# Patient Record
Sex: Male | Born: 1980 | Hispanic: Yes
Health system: Southern US, Academic
[De-identification: ages and names within clinical notes are randomized; demographics above are authoritative.]

## PROBLEM LIST (undated history)

## (undated) ENCOUNTER — Ambulatory Visit: Payer: MEDICARE

## (undated) ENCOUNTER — Encounter: Attending: Nephrology | Primary: Nephrology

## (undated) ENCOUNTER — Ambulatory Visit
Payer: MEDICARE | Attending: Student in an Organized Health Care Education/Training Program | Primary: Student in an Organized Health Care Education/Training Program

## (undated) ENCOUNTER — Encounter

## (undated) ENCOUNTER — Ambulatory Visit: Payer: MEDICARE | Attending: Registered" | Primary: Registered"

## (undated) ENCOUNTER — Telehealth

## (undated) ENCOUNTER — Ambulatory Visit: Payer: MEDICARE | Attending: Nephrology | Primary: Nephrology

## (undated) ENCOUNTER — Ambulatory Visit

## (undated) ENCOUNTER — Ambulatory Visit: Payer: MEDICARE | Attending: Addiction (Substance Use Disorder) | Primary: Addiction (Substance Use Disorder)

## (undated) ENCOUNTER — Encounter
Attending: Student in an Organized Health Care Education/Training Program | Primary: Student in an Organized Health Care Education/Training Program

## (undated) ENCOUNTER — Ambulatory Visit
Payer: MEDICARE | Attending: Rehabilitative and Restorative Service Providers" | Primary: Rehabilitative and Restorative Service Providers"

## (undated) ENCOUNTER — Ambulatory Visit: Payer: MEDICARE | Attending: Clinical | Primary: Clinical

## (undated) ENCOUNTER — Encounter: Attending: Physician Assistant | Primary: Physician Assistant

## (undated) ENCOUNTER — Encounter: Attending: Family | Primary: Family

## (undated) ENCOUNTER — Ambulatory Visit: Payer: MEDICARE | Attending: Psychologist | Primary: Psychologist

## (undated) ENCOUNTER — Encounter: Attending: Nurse Practitioner | Primary: Nurse Practitioner

## (undated) ENCOUNTER — Encounter: Payer: MEDICARE | Attending: Addiction (Substance Use Disorder) | Primary: Addiction (Substance Use Disorder)

## (undated) ENCOUNTER — Ambulatory Visit
Attending: Student in an Organized Health Care Education/Training Program | Primary: Student in an Organized Health Care Education/Training Program

## (undated) ENCOUNTER — Encounter: Attending: Registered" | Primary: Registered"

## (undated) ENCOUNTER — Ambulatory Visit: Payer: MEDICARE | Attending: Infectious Disease | Primary: Infectious Disease

## (undated) ENCOUNTER — Ambulatory Visit: Payer: MEDICARE | Attending: Surgery | Primary: Surgery

## (undated) ENCOUNTER — Encounter: Attending: Infectious Disease | Primary: Infectious Disease

## (undated) ENCOUNTER — Encounter: Attending: Anesthesiology | Primary: Anesthesiology

## (undated) ENCOUNTER — Ambulatory Visit: Attending: Nephrology | Primary: Nephrology

## (undated) ENCOUNTER — Telehealth: Attending: Infectious Disease | Primary: Infectious Disease

## (undated) ENCOUNTER — Ambulatory Visit: Payer: MEDICARE | Attending: Family | Primary: Family

## (undated) ENCOUNTER — Telehealth
Attending: Student in an Organized Health Care Education/Training Program | Primary: Student in an Organized Health Care Education/Training Program

## (undated) ENCOUNTER — Inpatient Hospital Stay

## (undated) ENCOUNTER — Ambulatory Visit: Payer: MEDICARE | Attending: Physician Assistant | Primary: Physician Assistant

## (undated) ENCOUNTER — Telehealth: Attending: Family | Primary: Family

## (undated) ENCOUNTER — Encounter: Attending: Blood Banking & Transfusion Medicine | Primary: Blood Banking & Transfusion Medicine

## (undated) ENCOUNTER — Encounter: Attending: Clinical | Primary: Clinical

## (undated) ENCOUNTER — Encounter: Attending: Vascular & Interventional Radiology | Primary: Vascular & Interventional Radiology

## (undated) ENCOUNTER — Telehealth: Attending: Registered" | Primary: Registered"

## (undated) ENCOUNTER — Institutional Professional Consult (permissible substitution): Payer: MEDICARE | Attending: Registered" | Primary: Registered"

## (undated) DIAGNOSIS — I10 Essential (primary) hypertension: Secondary | ICD-10-CM

## (undated) DIAGNOSIS — F419 Anxiety disorder, unspecified: Secondary | ICD-10-CM

## (undated) DIAGNOSIS — E119 Type 2 diabetes mellitus without complications: Secondary | ICD-10-CM

## (undated) DIAGNOSIS — N289 Disorder of kidney and ureter, unspecified: Secondary | ICD-10-CM

## (undated) DIAGNOSIS — G4730 Sleep apnea, unspecified: Secondary | ICD-10-CM

## (undated) DIAGNOSIS — E785 Hyperlipidemia, unspecified: Secondary | ICD-10-CM

## (undated) HISTORY — PX: LAPAROSCOPIC GASTRIC SLEEVE RESECTION: SHX5895

## (undated) HISTORY — DX: Hyperlipidemia, unspecified: E78.5

## (undated) HISTORY — DX: Sleep apnea, unspecified: G47.30

## (undated) HISTORY — PX: KIDNEY TRANSPLANT: SHX239

## (undated) HISTORY — DX: Anxiety disorder, unspecified: F41.9

## (undated) MED ORDER — GABAPENTIN 100 MG CAPSULE: Freq: Three times a day (TID) | ORAL | 0 days | Status: SS

---
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
## 2005-02-25 ENCOUNTER — Other Ambulatory Visit: Payer: Self-pay

## 2005-02-25 ENCOUNTER — Emergency Department: Payer: Self-pay | Admitting: Emergency Medicine

## 2005-03-12 ENCOUNTER — Ambulatory Visit: Payer: Self-pay | Admitting: Unknown Physician Specialty

## 2005-03-30 ENCOUNTER — Ambulatory Visit: Payer: Self-pay | Admitting: Unknown Physician Specialty

## 2005-04-01 ENCOUNTER — Emergency Department: Payer: Self-pay | Admitting: General Practice

## 2005-04-29 ENCOUNTER — Ambulatory Visit: Payer: Self-pay | Admitting: Unknown Physician Specialty

## 2005-10-07 ENCOUNTER — Other Ambulatory Visit: Payer: Self-pay

## 2005-10-07 ENCOUNTER — Emergency Department: Payer: Self-pay | Admitting: Emergency Medicine

## 2005-10-24 ENCOUNTER — Ambulatory Visit: Payer: Self-pay | Admitting: Unknown Physician Specialty

## 2005-10-30 ENCOUNTER — Ambulatory Visit: Payer: Self-pay | Admitting: Unknown Physician Specialty

## 2005-11-29 ENCOUNTER — Ambulatory Visit: Payer: Self-pay | Admitting: Unknown Physician Specialty

## 2005-12-30 ENCOUNTER — Ambulatory Visit: Payer: Self-pay | Admitting: Unknown Physician Specialty

## 2006-03-21 ENCOUNTER — Ambulatory Visit: Payer: Self-pay | Admitting: Nephrology

## 2006-08-25 ENCOUNTER — Ambulatory Visit: Payer: Self-pay | Admitting: Nephrology

## 2006-08-29 ENCOUNTER — Ambulatory Visit: Payer: Self-pay | Admitting: Nephrology

## 2006-09-04 ENCOUNTER — Ambulatory Visit: Payer: Self-pay | Admitting: Nephrology

## 2006-11-13 ENCOUNTER — Ambulatory Visit: Payer: Self-pay | Admitting: Nephrology

## 2007-06-09 ENCOUNTER — Ambulatory Visit: Payer: Self-pay

## 2013-04-19 ENCOUNTER — Inpatient Hospital Stay: Payer: Self-pay | Admitting: Internal Medicine

## 2013-04-19 LAB — COMPREHENSIVE METABOLIC PANEL
Albumin: 3.5 g/dL (ref 3.4–5.0)
Calcium, Total: 9.6 mg/dL (ref 8.5–10.1)
Chloride: 109 mmol/L — ABNORMAL HIGH (ref 98–107)
Co2: 21 mmol/L (ref 21–32)
Creatinine: 2.15 mg/dL — ABNORMAL HIGH (ref 0.60–1.30)
EGFR (Non-African Amer.): 40 — ABNORMAL LOW
Potassium: 4 mmol/L (ref 3.5–5.1)
SGOT(AST): 19 U/L (ref 15–37)
SGPT (ALT): 15 U/L (ref 12–78)
Total Protein: 6.7 g/dL (ref 6.4–8.2)

## 2013-04-19 LAB — URINALYSIS, COMPLETE
Bacteria: NONE SEEN
Bilirubin,UR: NEGATIVE
Glucose,UR: 150 mg/dL (ref 0–75)
Protein: 30
RBC,UR: 1 /HPF (ref 0–5)
Specific Gravity: 1.014 (ref 1.003–1.030)
Squamous Epithelial: <1
WBC UR: 1 /HPF (ref 0–5)

## 2013-04-19 LAB — CBC
MCV: 82 fL (ref 80–100)
Platelet: 203 10*3/uL (ref 150–440)
RBC: 4.01 10*6/uL — ABNORMAL LOW (ref 4.40–5.90)
RDW: 15 % — ABNORMAL HIGH (ref 11.5–14.5)
WBC: 13.7 10*3/uL — ABNORMAL HIGH (ref 3.8–10.6)

## 2013-04-20 LAB — BASIC METABOLIC PANEL
Anion Gap: 6 — ABNORMAL LOW (ref 7–16)
BUN: 21 mg/dL — ABNORMAL HIGH (ref 7–18)
Co2: 23 mmol/L (ref 21–32)
Creatinine: 2.15 mg/dL — ABNORMAL HIGH (ref 0.60–1.30)
EGFR (African American): 46 — ABNORMAL LOW
Glucose: 167 mg/dL — ABNORMAL HIGH (ref 65–99)
Osmolality: 282 (ref 275–301)
Potassium: 4.3 mmol/L (ref 3.5–5.1)
Sodium: 138 mmol/L (ref 136–145)

## 2013-04-20 LAB — CBC WITH DIFFERENTIAL/PLATELET
Basophil %: 0.5 %
Eosinophil #: 0.1 10*3/uL (ref 0.0–0.7)
Eosinophil %: 1.3 %
HGB: 10.2 g/dL — ABNORMAL LOW (ref 13.0–18.0)
MCH: 26.3 pg (ref 26.0–34.0)
MCHC: 32 g/dL (ref 32.0–36.0)
MCV: 82 fL (ref 80–100)
Monocyte #: 0.7 x10 3/mm (ref 0.2–1.0)
Monocyte %: 6.4 %
Neutrophil #: 9.3 10*3/uL — ABNORMAL HIGH (ref 1.4–6.5)
RBC: 3.86 10*6/uL — ABNORMAL LOW (ref 4.40–5.90)

## 2013-04-21 LAB — BASIC METABOLIC PANEL
Anion Gap: 3 — ABNORMAL LOW (ref 7–16)
BUN: 24 mg/dL — ABNORMAL HIGH (ref 7–18)
Calcium, Total: 9.4 mg/dL (ref 8.5–10.1)
Chloride: 108 mmol/L — ABNORMAL HIGH (ref 98–107)
Co2: 27 mmol/L (ref 21–32)
Creatinine: 2.18 mg/dL — ABNORMAL HIGH (ref 0.60–1.30)
EGFR (African American): 45 — ABNORMAL LOW
EGFR (Non-African Amer.): 39 — ABNORMAL LOW
Glucose: 228 mg/dL — ABNORMAL HIGH (ref 65–99)
Sodium: 138 mmol/L (ref 136–145)

## 2013-04-21 LAB — CBC WITH DIFFERENTIAL/PLATELET
Basophil #: 0.1 10*3/uL (ref 0.0–0.1)
Eosinophil #: 0.1 10*3/uL (ref 0.0–0.7)
Eosinophil %: 1.3 %
HGB: 10.6 g/dL — ABNORMAL LOW (ref 13.0–18.0)
MCH: 26.6 pg (ref 26.0–34.0)
MCHC: 32.1 g/dL (ref 32.0–36.0)
MCV: 83 fL (ref 80–100)
Monocyte #: 0.5 x10 3/mm (ref 0.2–1.0)
Monocyte %: 5.2 %
Neutrophil #: 8 10*3/uL — ABNORMAL HIGH (ref 1.4–6.5)
Neutrophil %: 84.9 %
RDW: 14.5 % (ref 11.5–14.5)
WBC: 9.5 10*3/uL (ref 3.8–10.6)

## 2013-04-25 LAB — CULTURE, BLOOD (SINGLE)

## 2014-03-20 ENCOUNTER — Inpatient Hospital Stay: Payer: Self-pay | Admitting: Internal Medicine

## 2014-03-20 LAB — URINALYSIS, COMPLETE
Bacteria: NONE SEEN
Bilirubin,UR: NEGATIVE
KETONE: NEGATIVE
LEUKOCYTE ESTERASE: NEGATIVE
Nitrite: NEGATIVE
Ph: 5 (ref 4.5–8.0)
Protein: 100
Specific Gravity: 1.014 (ref 1.003–1.030)
Squamous Epithelial: <1
WBC UR: 18 /HPF (ref 0–5)

## 2014-03-20 LAB — CBC WITH DIFFERENTIAL/PLATELET
BASOS PCT: 0.4 %
Basophil #: 0.1 10*3/uL (ref 0.0–0.1)
EOS ABS: 0.1 10*3/uL (ref 0.0–0.7)
Eosinophil %: 1.2 %
HCT: 36.8 % — ABNORMAL LOW (ref 40.0–52.0)
HGB: 12 g/dL — ABNORMAL LOW (ref 13.0–18.0)
LYMPHS ABS: 0.5 10*3/uL — AB (ref 1.0–3.6)
LYMPHS PCT: 4.2 %
MCH: 28.2 pg (ref 26.0–34.0)
MCHC: 32.5 g/dL (ref 32.0–36.0)
MCV: 87 fL (ref 80–100)
MONOS PCT: 8.6 %
Monocyte #: 1.1 x10 3/mm — ABNORMAL HIGH (ref 0.2–1.0)
Neutrophil #: 10.6 10*3/uL — ABNORMAL HIGH (ref 1.4–6.5)
Neutrophil %: 85.6 %
PLATELETS: 196 10*3/uL (ref 150–440)
RBC: 4.24 10*6/uL — ABNORMAL LOW (ref 4.40–5.90)
RDW: 14.5 % (ref 11.5–14.5)
WBC: 12.3 10*3/uL — ABNORMAL HIGH (ref 3.8–10.6)

## 2014-03-20 LAB — COMPREHENSIVE METABOLIC PANEL
ALK PHOS: 87 U/L
Albumin: 3.8 g/dL (ref 3.4–5.0)
Anion Gap: 9 (ref 7–16)
BUN: 31 mg/dL — ABNORMAL HIGH (ref 7–18)
Bilirubin,Total: 0.6 mg/dL (ref 0.2–1.0)
CALCIUM: 9.7 mg/dL (ref 8.5–10.1)
CREATININE: 2.74 mg/dL — AB (ref 0.60–1.30)
Chloride: 105 mmol/L (ref 98–107)
Co2: 19 mmol/L — ABNORMAL LOW (ref 21–32)
EGFR (African American): 34 — ABNORMAL LOW
EGFR (Non-African Amer.): 29 — ABNORMAL LOW
Glucose: 322 mg/dL — ABNORMAL HIGH (ref 65–99)
OSMOLALITY: 285 (ref 275–301)
Potassium: 4.2 mmol/L (ref 3.5–5.1)
SGOT(AST): <5 U/L — ABNORMAL LOW (ref 15–37)
SGPT (ALT): 17 U/L (ref 12–78)
Sodium: 133 mmol/L — ABNORMAL LOW (ref 136–145)
Total Protein: 7.1 g/dL (ref 6.4–8.2)

## 2014-03-20 LAB — LIPASE, BLOOD: LIPASE: 232 U/L (ref 73–393)

## 2014-03-21 LAB — COMPREHENSIVE METABOLIC PANEL
Albumin: 3.1 g/dL — ABNORMAL LOW (ref 3.4–5.0)
Alkaline Phosphatase: 66 U/L
Anion Gap: 6 — ABNORMAL LOW (ref 7–16)
BUN: 22 mg/dL — AB (ref 7–18)
Bilirubin,Total: 0.6 mg/dL (ref 0.2–1.0)
CALCIUM: 8.8 mg/dL (ref 8.5–10.1)
CO2: 22 mmol/L (ref 21–32)
Chloride: 112 mmol/L — ABNORMAL HIGH (ref 98–107)
Creatinine: 2.31 mg/dL — ABNORMAL HIGH (ref 0.60–1.30)
EGFR (African American): 42 — ABNORMAL LOW
GFR CALC NON AF AMER: 36 — AB
Glucose: 147 mg/dL — ABNORMAL HIGH (ref 65–99)
Osmolality: 285 (ref 275–301)
Potassium: 4.3 mmol/L (ref 3.5–5.1)
SGOT(AST): 13 U/L — ABNORMAL LOW (ref 15–37)
SGPT (ALT): 17 U/L (ref 12–78)
Sodium: 140 mmol/L (ref 136–145)
TOTAL PROTEIN: 5.8 g/dL — AB (ref 6.4–8.2)

## 2014-03-21 LAB — CBC WITH DIFFERENTIAL/PLATELET
BASOS ABS: 0 10*3/uL (ref 0.0–0.1)
Basophil %: 0.2 %
EOS ABS: 0.2 10*3/uL (ref 0.0–0.7)
Eosinophil %: 2.8 %
HCT: 33.1 % — ABNORMAL LOW (ref 40.0–52.0)
HGB: 10.8 g/dL — ABNORMAL LOW (ref 13.0–18.0)
LYMPHS ABS: 0.7 10*3/uL — AB (ref 1.0–3.6)
LYMPHS PCT: 9.7 %
MCH: 28.5 pg (ref 26.0–34.0)
MCHC: 32.7 g/dL (ref 32.0–36.0)
MCV: 87 fL (ref 80–100)
MONO ABS: 0.8 x10 3/mm (ref 0.2–1.0)
Monocyte %: 11.5 %
Neutrophil #: 5.6 10*3/uL (ref 1.4–6.5)
Neutrophil %: 75.8 %
Platelet: 173 10*3/uL (ref 150–440)
RBC: 3.8 10*6/uL — AB (ref 4.40–5.90)
RDW: 14.2 % (ref 11.5–14.5)
WBC: 7.4 10*3/uL (ref 3.8–10.6)

## 2014-04-06 ENCOUNTER — Inpatient Hospital Stay: Payer: Self-pay | Admitting: Internal Medicine

## 2014-04-06 LAB — COMPREHENSIVE METABOLIC PANEL
ALBUMIN: 3.8 g/dL (ref 3.4–5.0)
ALK PHOS: 79 U/L
ANION GAP: 5 — AB (ref 7–16)
AST: 20 U/L (ref 15–37)
BILIRUBIN TOTAL: 0.3 mg/dL (ref 0.2–1.0)
BUN: 33 mg/dL — ABNORMAL HIGH (ref 7–18)
CO2: 20 mmol/L — AB (ref 21–32)
Calcium, Total: 8.7 mg/dL (ref 8.5–10.1)
Chloride: 109 mmol/L — ABNORMAL HIGH (ref 98–107)
Creatinine: 3.61 mg/dL — ABNORMAL HIGH (ref 0.60–1.30)
EGFR (African American): 24 — ABNORMAL LOW
EGFR (Non-African Amer.): 21 — ABNORMAL LOW
GLUCOSE: 318 mg/dL — AB (ref 65–99)
Osmolality: 288 (ref 275–301)
POTASSIUM: 4.9 mmol/L (ref 3.5–5.1)
SGPT (ALT): 26 U/L (ref 12–78)
Sodium: 134 mmol/L — ABNORMAL LOW (ref 136–145)
Total Protein: 7.3 g/dL (ref 6.4–8.2)

## 2014-04-06 LAB — CBC WITH DIFFERENTIAL/PLATELET
Basophil #: 0 10*3/uL (ref 0.0–0.1)
Basophil %: 0.7 %
Eosinophil #: 0 10*3/uL (ref 0.0–0.7)
Eosinophil %: 0.7 %
HCT: 37.8 % — ABNORMAL LOW (ref 40.0–52.0)
HGB: 12.3 g/dL — ABNORMAL LOW (ref 13.0–18.0)
LYMPHS PCT: 23.5 %
Lymphocyte #: 0.6 10*3/uL — ABNORMAL LOW (ref 1.0–3.6)
MCH: 28.5 pg (ref 26.0–34.0)
MCHC: 32.6 g/dL (ref 32.0–36.0)
MCV: 87 fL (ref 80–100)
MONO ABS: 0.4 x10 3/mm (ref 0.2–1.0)
Monocyte %: 16.4 %
Neutrophil #: 1.6 10*3/uL (ref 1.4–6.5)
Neutrophil %: 58.7 %
PLATELETS: 180 10*3/uL (ref 150–440)
RBC: 4.33 10*6/uL — AB (ref 4.40–5.90)
RDW: 14.6 % — AB (ref 11.5–14.5)
WBC: 2.7 10*3/uL — AB (ref 3.8–10.6)

## 2014-04-06 LAB — URINALYSIS, COMPLETE
BACTERIA: NONE SEEN
Bilirubin,UR: NEGATIVE
Ketone: NEGATIVE
Leukocyte Esterase: NEGATIVE
Nitrite: NEGATIVE
Ph: 5 (ref 4.5–8.0)
Specific Gravity: 1.017 (ref 1.003–1.030)
Squamous Epithelial: <1
WBC UR: <1 /HPF (ref 0–5)

## 2014-04-06 LAB — LIPASE, BLOOD: LIPASE: 389 U/L (ref 73–393)

## 2014-04-06 LAB — TROPONIN I

## 2014-04-07 LAB — COMPREHENSIVE METABOLIC PANEL
ALT: 19 U/L (ref 12–78)
Albumin: 2.8 g/dL — ABNORMAL LOW (ref 3.4–5.0)
Alkaline Phosphatase: 55 U/L
Anion Gap: 8 (ref 7–16)
BILIRUBIN TOTAL: 0.2 mg/dL (ref 0.2–1.0)
BUN: 24 mg/dL — ABNORMAL HIGH (ref 7–18)
CHLORIDE: 116 mmol/L — AB (ref 98–107)
CO2: 14 mmol/L — AB (ref 21–32)
Calcium, Total: 7.9 mg/dL — ABNORMAL LOW (ref 8.5–10.1)
Creatinine: 2.59 mg/dL — ABNORMAL HIGH (ref 0.60–1.30)
EGFR (Non-African Amer.): 31 — ABNORMAL LOW
GFR CALC AF AMER: 36 — AB
Glucose: 232 mg/dL — ABNORMAL HIGH (ref 65–99)
Osmolality: 287 (ref 275–301)
Potassium: 4.6 mmol/L (ref 3.5–5.1)
SGOT(AST): 15 U/L (ref 15–37)
Sodium: 138 mmol/L (ref 136–145)
TOTAL PROTEIN: 5.5 g/dL — AB (ref 6.4–8.2)

## 2014-04-07 LAB — CBC WITH DIFFERENTIAL/PLATELET
BASOS PCT: 0.1 %
Basophil #: 0 10*3/uL (ref 0.0–0.1)
Basophil #: 0 10*3/uL (ref 0.0–0.1)
Basophil %: 0.4 %
EOS PCT: 0.1 %
Eosinophil #: 0 10*3/uL (ref 0.0–0.7)
Eosinophil #: 0 10*3/uL (ref 0.0–0.7)
Eosinophil %: 0.1 %
HCT: 29.5 % — AB (ref 40.0–52.0)
HCT: 32 % — AB (ref 40.0–52.0)
HGB: 9.7 g/dL — AB (ref 13.0–18.0)
HGB: 10.5 g/dL — AB (ref 13.0–18.0)
Lymphocyte #: 0.5 10*3/uL — ABNORMAL LOW (ref 1.0–3.6)
Lymphocyte #: 0.3 10*3/uL — ABNORMAL LOW (ref 1.0–3.6)
Lymphocyte %: 23.2 %
Lymphocyte %: 8.4 %
MCH: 28.3 pg (ref 26.0–34.0)
MCH: 28.2 pg (ref 26.0–34.0)
MCHC: 32.8 g/dL (ref 32.0–36.0)
MCHC: 32.8 g/dL (ref 32.0–36.0)
MCV: 86 fL (ref 80–100)
MCV: 86 fL (ref 80–100)
MONO ABS: 0.3 x10 3/mm (ref 0.2–1.0)
Monocyte #: 0.5 x10 3/mm (ref 0.2–1.0)
Monocyte %: 16.5 %
Monocyte %: 11.9 %
NEUTROS ABS: 1.2 10*3/uL — AB (ref 1.4–6.5)
NEUTROS ABS: 3 10*3/uL (ref 1.4–6.5)
NEUTROS PCT: 79.5 %
Neutrophil %: 59.8 %
PLATELETS: 121 10*3/uL — AB (ref 150–440)
Platelet: 133 10*3/uL — ABNORMAL LOW (ref 150–440)
RBC: 3.43 10*6/uL — AB (ref 4.40–5.90)
RBC: 3.72 10*6/uL — ABNORMAL LOW (ref 4.40–5.90)
RDW: 14.5 % (ref 11.5–14.5)
RDW: 14.5 % (ref 11.5–14.5)
WBC: 1.9 10*3/uL — AB (ref 3.8–10.6)
WBC: 3.8 10*3/uL (ref 3.8–10.6)

## 2014-04-07 LAB — RETICULOCYTES
ABSOLUTE RETIC COUNT: 0.0912 10*6/uL (ref 0.019–0.186)
RETICULOCYTE: 2.45 % (ref 0.4–3.1)

## 2014-04-07 LAB — URINE CULTURE

## 2014-04-07 LAB — MAGNESIUM: MAGNESIUM: 1.3 mg/dL — AB

## 2014-04-08 LAB — RAPID INFLUENZA A&B ANTIGENS

## 2014-04-08 LAB — CBC WITH DIFFERENTIAL/PLATELET
Basophil #: 0 10*3/uL (ref 0.0–0.1)
Basophil %: 0.4 %
EOS PCT: 0.9 %
Eosinophil #: 0 10*3/uL (ref 0.0–0.7)
HCT: 30.4 % — ABNORMAL LOW (ref 40.0–52.0)
HGB: 9.6 g/dL — ABNORMAL LOW (ref 13.0–18.0)
LYMPHS PCT: 14.6 %
Lymphocyte #: 0.6 10*3/uL — ABNORMAL LOW (ref 1.0–3.6)
MCH: 27.1 pg (ref 26.0–34.0)
MCHC: 31.5 g/dL — ABNORMAL LOW (ref 32.0–36.0)
MCV: 86 fL (ref 80–100)
Monocyte #: 0.4 x10 3/mm (ref 0.2–1.0)
Monocyte %: 10.3 %
Neutrophil #: 2.8 10*3/uL (ref 1.4–6.5)
Neutrophil %: 73.8 %
Platelet: 123 10*3/uL — ABNORMAL LOW (ref 150–440)
RBC: 3.53 10*6/uL — ABNORMAL LOW (ref 4.40–5.90)
RDW: 14.5 % (ref 11.5–14.5)
WBC: 3.8 10*3/uL (ref 3.8–10.6)

## 2014-04-08 LAB — CREATININE, SERUM
Creatinine: 2.11 mg/dL — ABNORMAL HIGH (ref 0.60–1.30)
EGFR (African American): 47 — ABNORMAL LOW
GFR CALC NON AF AMER: 40 — AB

## 2014-04-08 LAB — WBCS, STOOL

## 2014-04-10 LAB — BETA STREP CULTURE(ARMC)

## 2014-04-10 LAB — STOOL CULTURE

## 2014-04-11 LAB — CULTURE, BLOOD (SINGLE)

## 2014-09-19 DIAGNOSIS — T8611 Kidney transplant rejection: Secondary | ICD-10-CM | POA: Insufficient documentation

## 2014-12-13 DIAGNOSIS — E1142 Type 2 diabetes mellitus with diabetic polyneuropathy: Secondary | ICD-10-CM | POA: Insufficient documentation

## 2014-12-13 DIAGNOSIS — Z794 Long term (current) use of insulin: Secondary | ICD-10-CM | POA: Insufficient documentation

## 2014-12-13 DIAGNOSIS — G629 Polyneuropathy, unspecified: Secondary | ICD-10-CM | POA: Insufficient documentation

## 2015-04-21 NOTE — H&P (Signed)
PATIENT NAME:  Eric Frey, Eric Frey MR#:  [id number] DATE OF BIRTH:  Jan 06, 1981  DATE OF ADMISSION:  04/19/2013  PRIMARY CARE PHYSICIAN: Located at [facility].   CHIEF COMPLAINT: Chills, weakness, and redness and swelling in the mid-abdomen area.   HISTORY OF PRESENT ILLNESS: This is a 34 year old male who presents to the hospital with some chills, weakness, and also with some redness in his mid-abdomen area. The patient said that his symptoms began about 3 to 4 days ago. He noticed a small abrasion in the lower part of his abdomen from a belt that he was wearing, which was tight. Then he started to notice some redness and swelling around the abrasive area about 2 to 3 days ago. It has progressively gotten worse. He developed some chills. Also felt more weak.   He also had been suffering from an upper respiratory infection for which he was placed on a prednisone taper about a week or so ago. He is currently on a 10 mg dose of prednisone daily due to his history of a renal transplant. Because of symptoms that are not improving, he came to the ER for further evaluation. In Emergency Room, the patient was noted to have significant redness, swelling around his mid-abdomen area, consistent with an abdominal wall cellulitis. Hospitalist services were contacted for further treatment and evaluation.   REVIEW OF SYSTEMS:  CONSTITUTIONAL: No documented fever. No weight gain or weight loss.  EYES: No blurry or double vision.  ENT: No tinnitus. No postnasal drip. No redness of the oropharynx.  RESPIRATORY: Positive cough. No wheeze, no hemoptysis, no dyspnea.  CARDIOVASCULAR: No chest pain, no orthopnea, no palpitations, no syncope.  GASTROINTESTINAL: No nausea and vomiting, diarrhea, no abdominal pain, no melena or hematochezia.  GENITOURINARY: No dysuria or hematuria.  ENDOCRINE: No polyuria or nocturia. No heat or cold intolerance.  HEMATOLOGIC: No anemia. No bruising. No bleeding.  INTEGUMENTARY: No  rashes. No lesions.  MUSCULOSKELETAL: No arthritis, no swelling, no gout.  NEUROLOGIC: No numbness, tingling. No ataxia. No seizure-type activity.   PSYCHIATRIC: No anxiety. No insomnia. No ADD.   PAST MEDICAL HISTORY: Is consistent with diabetes, hypertension, history of chronic kidney disease status post renal transplant in 2008.   ALLERGIES: No known drug allergies.   SOCIAL HISTORY: No smoking. No alcohol abuse. No illicit drug abuse. Lives at home with his wife.   FAMILY HISTORY: No family history of diabetes, hypertension, or any history of any renal disease. His mother does have skin cancer.   CURRENT MEDICATIONS: Are as follows: Lisinopril 10 mg daily, amlodipine 10 mg daily, CellCept 500 mg, 4 tablets b.i.d., glimepiride 2 tablets daily, 2 mg tablets, omeprazole 20 mg daily, Actos 30 mg daily, prednisone 10 mg daily, Prograf 1 mg capsule, 2 capsules b.i.d., and sodium bicarbonate 650 mg q.i.d.   PHYSICAL EXAMINATION ON ADMISSION: Is as follows: Patient's vital signs are noted to be temperature is 98.4, pulse 99, respirations 20, blood pressure 140/85, sats 99% on room air.  GENERAL: He is a pleasant -appearing male, no apparent distress.  HEENT: Atraumatic, normocephalic. Extraocular muscles are intact. Pupils equal and reactive to light. Sclerae anicteric. No conjunctival injection. No pharyngeal erythema.  NECK: Supple. There is no jugular venous distention. No bruits, no lymphadenopathy or thyromegaly.  HEART: Regular rate and rhythm. No murmurs, no rubs, and no clicks.  LUNGS: Clear to auscultation bilaterally. No rales, no rhonchi. No wheezes.  ABDOMEN: Soft, flat, nontender, nondistended. Has good bowel sounds. No hepatosplenomegaly appreciated.  EXTREMITIES: No evidence of any cyanosis, clubbing, or peripheral edema. Has +2 pedal and radial pulses bilaterally.  NEUROLOGICAL: The patient is alert, awake, and oriented x 3, with no focal motor or sensory deficits appreciated  bilaterally.  SKIN EXAM: Is moist, warm. The patient does have a large, indurated and red area from his mid-abdomen above the umbilicus and below the umbilicus, tracking down to the bilateral inguinal areas, consistent with cellulitis.  LYMPHATIC: There is no cervical or axillary lymphadenopathy.   LABORATORY EXAM: Shows a serum glucose of 222, BUN 25, creatinine 2.15, sodium 137, potassium 4, chloride 109, bicarbonate 21.   LFTs are within normal limits.   White cell count 13.7, hemoglobin of 10.6, hematocrit 32.8, platelet count 203.   Urinalysis is within normal limits.   ASSESSMENT AND PLAN: This is a 34 year old male with a past medical history of chronic kidney disease status post renal transplant, hypertension, diabetes, who presents to the hospital with chills, weakness, noted to have a redness, induration of the abdominal wall, likely related to abdominal wall cellulitis.   1.  Abdominal wall cellulitis: This is likely the cause of redness, swelling and pain in the abdomen. The patient had an abrasive area that he noted about 2 to 3 days ago, and then developed progressive redness and induration. I will admit the patient, start the patient on IV fluids, IV vancomycin, follow up cultures and follow him clinically.  2.  History of chronic kidney disease: The patient is status post renal transplant in 2008. His creatinine is currently close to baseline. He follows up at [facility]. I will continue his CellCept, Prograf, and prednisone. If his creatinine gets worse would consult nephrology.  3.  Diabetes: Continue his glimiperide, Actos, and place on a carbohydrate-controlled diet.   4.  Hypertension: Continue lisinopril. Continue Norvasc. The patient is hemodynamically stable.   The patient is a FULL CODE.   Time spent is 50 minutes.    ____________________________ Belia Heman. Verdell Carmine, MD vjs:dm D: 04/19/2013 13:12:17 ET T: 04/19/2013 13:29:09 ET JOB#: [id number]  cc: Belia Heman.  Verdell Carmine, MD, <Dictator> Henreitta Leber MD ELECTRONICALLY SIGNED 04/19/2013 17:42

## 2015-04-21 NOTE — Discharge Summary (Signed)
PATIENT NAME:  Eric Frey, Eric Frey MR#:  [id number] DATE OF BIRTH:  [birth year]  DATE OF ADMISSION:  04/19/2013 DATE OF DISCHARGE:  04/21/2013  ADMISSION DIAGNOSIS: Abdominal wall cellulitis.   DISCHARGE DIAGNOSES:  1.  Abdominal wall cellulitis.  2.  History of diabetes.  3.  History of hypertension.  4.  History of renal transplant, with chronic kidney disease; baseline creatinine is 2.15.  LABORATORIES AT DISCHARGE: Blood cultures are negative to-date. Sodium 138, potassium 4.4, chloride 108, bicarbonate 27, BUN 24, creatinine 2.18, glucose is 228. GFR is  50.   White blood cells 9.5, hemoglobin 10.6, hematocrit 32, platelets of 229.   HOSPITAL COURSE:  A very pleasant 34 year old male with a history of chronic kidney disease status post renal transplant, on CellCept and Prograf, who presented with  abdominal cellulitis. For further details, please refer to the hand p.   1.  Abdominal wall cellulitis: The patient was admitted, started on vancomycin. Blood cultures are negative to-date. His white blood cell count was elevated at admission, however these have now normalized. His cellulitis looks much better than on admission. He has very minimal redness. No pain or tenderness at the  site. He had a little drainage from the area of cellulitis which was just not purulent, just clear fluid. The patient tolerated the medications well. Will discharge him on some Levaquin, which per the pharmacy does not need to be renally-dosed due to his GFR of 53, and clindamycin.  2.  Diabetes: This was stable, and his blood sugars were monitored.  3. Hypertension: The patient was continued on his outpatient medication.  4.  History of chronic kidney disease, with a renal transplant: Patient was continued on his outpatient medications. His creatinine remained at baseline of 2.1.   DISCHARGE MEDICATIONS: 1.  Prograf 1 mg, 2 tablets every 12 hours.  2.  CellCept 500 mg, 4 tablets twice a day.  3.  Norvasc 10 mg  daily.  4.  Glimepiride, 2 mg 2 tablets daily.  5.  Lisinopril 10 mg daily.  6.  Omeprazole 20 mg daily.  7.  Pioglitazone 30 mg daily.  8.  Sodium bicarbonate 650 mg 4 times a day.  9.  Prednisone 10 mg daily.  10.  Clindamycin 300 mg q.8 hours x 7 days.  11.  Levaquin 500 mg daily for 7 days.   DISCHARGE DIET: ADA diet.   DISCHARGE ACTIVITY: As tolerated.   DISCHARGE FOLLOWUP: The patient will follow-up with [facility] in 1 week.   Time spent: Approximately 35 minutes.   ____________________________ Donell Beers. Benjie Karvonen, MD spm:dm D: 04/21/2013 13:27:20 ET T: 04/21/2013 13:51:06 ET JOB#: [id number]  cc: Cloy Cozzens P. Benjie Karvonen, MD, <Dictator> [facility]  [facility]) Lindy Pennisi P Roberto Hlavaty MD ELECTRONICALLY SIGNED 04/21/2013 15:28

## 2015-04-22 NOTE — Discharge Summary (Signed)
PATIENT NAME:  Eric Frey, Eric Frey MR#:  [id number] DATE OF BIRTH:  Jun 06, 1981  DATE OF ADMISSION:  03/20/2014 DATE OF DISCHARGE:  03/21/2014  PRIMARY CARE PHYSICIAN: None local.  DISCHARGE DIAGNOSES: 1.  Acute gastroenteritis.  2.  Urinary tract infection.  3.  Acute renal failure with chronic kidney disease.  4.  Diabetes.  5.  Kidney transplant.   CONDITION: Stable.   CODE STATUS: FULL CODE.   HOME MEDICATIONS: Please refer to the medication reconciliation list.   DIET: Low-sodium, low-fat, low-cholesterol ADA diet and renal diet.  ACTIVITY: As tolerated.    FOLLOWUP:  Follow up with PCP within 1 to 2 weeks. Follow up with [facility] within 1 to 2 weeks.   REASON FOR ADMISSION:  Acute on chronic renal failure with dehydration.   HOSPITAL COURSE: The patient is a 34 year old male with a history of renal transplant, came to ED for 3 to 4 days of intractable nausea, vomiting and diarrhea. The patient was found to be dehydrated, tachycardic with acute on chronic renal failure in ED. For detailed history and physical examination, please refer to the admission note dictated by Dr. Doy Hutching. Labetalol data on admission date showed glucose 322, BUN 31, creatinine 2.74, lipase 332, sodium 133, potassium 4.2, WBC 12.3. Urinalysis showed WBC of 18. After admission, the patient had been treated with IV fluid support. The patient had been treated with Rocephin. The patient's KUB did not show any obstruction. The patient's symptoms have much improved. He is clinically stable and will be discharged to home today. I discussed the patient's discharge plan with the patient, nurse and case manager.   TIME SPENT: About 33 minutes.  ____________________________ Demetrios Loll, MD qc:dmm D: 03/21/2014 16:25:47 ET T: 03/21/2014 20:31:01 ET JOB#: [id number]  cc: Demetrios Loll, MD, <Dictator> Demetrios Loll MD ELECTRONICALLY SIGNED 03/25/2014 16:07

## 2015-04-22 NOTE — H&P (Signed)
PATIENT NAME:  Eric Frey, Eric Frey MR#:  [id number] DATE OF BIRTH:  04-14-1981  DATE OF ADMISSION:  04/06/2014  Primary nephrologist: Dr. Suzan Nailer in [employer].   EMERGENCY ROOM REFERRING PHYSICIAN:   Dr. Jacqualine Code.   CHIEF COMPLAINT: Nausea, diarrhea, chills, cough.   HISTORY OF PRESENTING ILLNESS: A 34 year old male patient with history of renal transplant in 2008 and history of diabetes. Presents to the Emergency Room complaining of some chills, generalized body aches and cough that started on Saturday, five days back. After these a day later the patient had diarrhea, which was watery about 8 to 10 episodes a day along with nausea but no vomiting. No abdominal pain. He has not noticed any blood. He continues to have chills, diarrhea. Initially he tried to hydrate himself hoping this would resolve, but this did not resolve and the patient presented to the ER. Here, the patient has been found to have leukopenia and white blood cells 2.7 with creatinine increased to 3.3 from his baseline of 2.2. The patient is being admitted to the hospitalist service.   The patient has not used any recent antibiotics. No sick contacts. He is on CellCept, Prograf and prednisone since 2008. The patient was recently in the hospital for nausea, vomiting, diarrhea, which resolved within a day and was discharged home in March 2015 from here. He felt fine after discharge until these symptoms started five days back.   PAST MEDICAL HISTORY: 1. CKD with renal transplant.  2. Benign hypertension.  3. Type 2 diabetes mellitus.  4. Depression.   HOME MEDICATIONS: 1. Sodium bicarbonate 650 mg oral 4 times a day.  2. Prograf 2 mg oral 2 times a day.  3. Prednisone 10 mg day.  4. Actos 30 mg daily.  5. Omeprazole 20 mg daily.  6. Lisinopril 10 mg today.  7. CellCept 2000 mg oral b.i.d.  8. Amaryl 2 mg oral b.i.d.  9. Norvasc 10 mg oral daily.   ALLERGIES: No known drug allergies.   SOCIAL HISTORY: The patient does not smoke. No  alcohol. No illicit drugs. Lives at home with his wife and kids.   FAMILY HISTORY: Diabetes, hypertension.   REVIEW OF SYSTEMS:  CONSTITUTIONAL: Complains of fatigue and weakness. No weight loss, weight gain.  EYES: No blurred vision, pain, redness.  EARS, NOSE, THROAT: No tinnitus, ear pain, hearing loss.  RESPIRATORY: Has cough, which is nonproductive. No shortness of breath.  CARDIOVASCULAR: No chest pain, orthopnea, edema.  GASTROINTESTINAL: Has nausea but no vomiting. Has diarrhea. No abdominal pain.  GENITOURINARY: No dysuria, hematuria, frequency.  ENDOCRINE: No polyuria, nocturia, thyroid problems.  HEMATOLOGIC AND LYMPHATIC: No anemia, easy bruising, bleeding.  INTEGUMENTARY: No acne, rash, lesion.  MUSCULOSKELETAL: Has generalized body pains. No joint pains.  NEUROLOGIC: No focal numbness, weakness, seizure.  PSYCHIATRIC: No anxiety or depression.   PHYSICAL EXAMINATION: VITAL SIGNS: Temperature 97.9, pulse of 111, respirations 24, blood pressure 130/72, saturating 96% on room air.  GENERAL: Obese male patient lying in bed, seems comfortable, conversational, cooperative with exam.  PSYCHIATRIC: He is alert and oriented x 3, pleasant. Mood and affect appropriate. Judgment intact.  HEENT: Atraumatic, normocephalic. Oral mucosa dry and pink. External ears and nose normal. No pallor. No icterus. Pupils bilaterally equal and reactive to light.  NECK: Supple. No thyromegaly or palpable lymph nodes. Trachea midline. No carotid bruit, JVD.   CARDIOVASCULAR: S1, S2, tachycardic without any murmurs. Peripheral pulses 2+. No edema.  RESPIRATORY: Normal work of breathing. Clear to auscultation on both sides.  GASTROINTESTINAL: Soft abdomen, nontender. Bowel sounds present. No organomegaly palpable.  SKIN: Warm and dry. No petechiae, rash, ulcers.  MUSCULOSKELETAL: No joint swelling, redness, effusion of the large joints. Normal muscle tone.  NEUROLOGICAL: Motor strength five out of five  in upper and lower extremities. Sensation is intact all over.  LYMPHATIC: No cervical lymphadenopathy.  GENITOURINARY: No CVA tenderness. Bladder distention.   LABORATORY STUDIES: Show glucose of 296, glucose 318, BUN 33, creatinine 3.61, sodium 134, potassium 4.2, bicarbonate 20. Lipase 389, AST, ALT, alkaline phosphatase, bilirubin normal. Troponin less than 0.02.   WBC 2.7, hemoglobin 12.3, platelets of 180.   Urinalysis shows no bacteria, less than 1 WBC.   EKG shows sinus tachycardia with left ventricular hypertrophy.   ASSESSMENT AND PLAN: 1. Diarrhea in a patient with chills, leukopenia, tachycardia suggesting sepsis. We will check for Clostridium difficile at this time. No empiric antibiotics. We will also send for stool ova, parasites, cultures and also check for a cryptospora and isospora in this immunosuppressed patient on CellCept and Prograf. Start on IV fluids.  2. Sore throat with chills, muscle aches. We will check for strep throat at this time. The patient's symptoms could all be regular viral syndrome. Needs to be closely monitored. Blood cultures have been sent. No antibiotics at this time, afebrile.  3. Acute renal failure over chronic kidney disease. The patient's baseline creatinine seems to about 2.2, presently elevated at 3.3, likely from dehydration and sepsis. The patient does have severe sepsis considering the acute renal failure. We will start him on IV fluid resuscitation and closely monitor. If there is no improvement we will consult nephrology, monitor I's and O's.  4. Leukopenia could be from his CellCept, Prograf or the sepsis , viral syndrome. We will consult oncology for further input with his leukopenia. There is no neutropenia. 5. Hypertension. Continue amlodipine,  hold lisinopril  the patient is on. 6. Uncontrolled diabetes mellitus. Blood sugars have been elevated at 300s. Continue  Amaryl> , pioglitazone and Amaryl the patient is on. We will put him on  sliding scale insulin. His elevation of blood sugars could be from the sepsis.  7. Deep vein thrombosis prophylaxis with Lovenox. 8. CODE STATUS: FULL CODE.   TIME SPENT TODAY ON THIS CASE: Was 50 minutes.   ____________________________ Leia Alf Abygayle Deltoro, MD srs:sg D: 04/06/2014 12:59:00 ET T: 04/06/2014 13:33:06 ET JOB#: [id number]  cc: Alveta Heimlich R. Darvin Neighbours, MD, <Dictator> [facility]  Neita Carp MD ELECTRONICALLY SIGNED 04/07/2014 10:39

## 2015-04-22 NOTE — Discharge Summary (Signed)
PATIENT NAME:  Eric Frey, Eric Frey MR#:  [id number] DATE OF BIRTH:  02-27-1981  DATE OF ADMISSION:  04/06/2014 DATE OF DISCHARGE:  04/08/2014  DISCHARGE DIAGNOSES: 1. Possible viral syndrome, with throat and body aches. 2. Acute bronchitis. 3. Renal transplant, history of chronic renal failure.  4. The patient also has a history of hypertension and diabetes.   DISCHARGE MEDICATIONS:  1. Prograf 1 mg 2 capsules p.o. every 12 hours. 2. CellCept 500 mg 4 tablets 2 times. That is he takes 2000 mg each  time 2 times a day.  3. Amlodipine 10 mg p.o. daily.  4. Amaryl 4 mg p.o. daily.  5. Lisinopril 10 mg p.o. daily.  6. Omeprazole 20 mg p.o. daily.  7. Paxil 30 mg p.o. daily.  8. Sodium bicarbonate 650 mg p.o. 4 times daily.  9. Prednisone 10 mg p.o. daily.   NEW MEDICATION:   1. Azithromycin 500 mg p.o. daily for 3 days. 2. Augmentin 875/125 p.o. b.i.d. for 10 days.  3. CellCept as I mentioned. 4. Prograf as I mentioned.   DIET: Low-sodium, low-carb diet.   CONSULTATIONS: Oncology consultation with Dr. Inez Pilgrim.  FOLLOWUP: The patient advised to follow up with his nephrologist at [facility].   HOSPITAL COURSE: The patient is a 34 year old male patient with history of renal transplant, and immunosuppression with CellCept, Prograf, and prednisone, who came in because of nausea, vomiting, diarrhea, cough and chills. The patient had a creatinine of 3.3 on admission, admitted to the hospitalist service with diarrhea and a fever, sore throat with possible sepsis. The patient's diarrhea resolved so we could not get a good sample. He has had a formed stool the next day, so stool C. difficile was not done.   The patient had blood cultures which were negative and an x-ray of chest did not show any acute infiltrates. The patient had a flu test which is negative. The patient received IV fluids for dehydration. Also strep throat cultures which are negative for streptococci. The patient did have a lot of  cough. We started him on Rocephin and Zithromax. I spoke with his nephrologist at [facility] who suggested that the patient, if he does not need get better, needs a transfer but today he feels much better. His white count is 3.8 and the patient had a CMV virus PCR sent out and I do not have the result. He can follow up with his nephrologist. The patient's creatinine came back to 2.1 today. The patient was seen by Dr. Inez Pilgrim while he was here and he said the patient does not have any neutropenic sepsis symptoms. He discontinued neutropenic isolation, isolation. The patient is continued on Prograf, CellCept, and prednisone. The patient's baseline creatinine is around 2 according to the discussion with his nephrologist at [facility]. The patient was discharged home with Augmentin and azithromycin.   PHYSICAL EXAMINATION:  HEART: Heart is clear to auscultation. S1 and S2 clear. No murmurs.  LUNGS: Clear to auscultation.  ABDOMEN: Soft, nontender, nondistended, BS present NeuroROLOGIC: The patient is alert, awake, oriented, and no lymphadenopathy today.  VITALS SIGNS: Today, temperature 98.2, heart rate 97, blood pressure 134/87. Saturations 97% on room air. When he was admitted, the patient had a low-grade temp and yesterday, it was 100.3.   LABORATORIES: The patient's urine culture did not show any growth and lipase was 389.   TIME SPENT ON DISCHARGE PREPARATION: More than 30 minutes.    ____________________________ Epifanio Lesches, MD sk:lt D: 04/08/2014 [id number] ET T: 04/09/2014 04:24:32 ET JOB#:  [id number]  cc: Epifanio Lesches, MD, <Dictator> Epifanio Lesches MD ELECTRONICALLY SIGNED 04/18/2014 22:43

## 2015-04-22 NOTE — Consult Note (Signed)
Brief Consult Note: Diagnosis: anemia thrombocytopenia lymphopenia.   Patient was seen by consultant.   Comments: SEE DICTATED NOTE TO FOLLOW... mild anemia, hgb wax and wane with hydration, no sign bleeding, mild thrombocytopenia, but some recovery, recent up to 131 from 123, initially low normal polys, improving, was never absolutely neutropenic....multifactorial, medications, chronic renal failure, no evience hemolysis, lymphopenia likely die to prednisone, thrombocytopenia likley due to viral syndrome, anemia likely chronic, renal failure... clinically better, sore throat and diarrha better, still complained of some chill and muscle ache earlier relieved by tylenol...PLAN.Marland KitchenF/U SERIAL CBC   WOULD NOT HOLD IMMUNOSUPPRESSIVES  AT THIS TIME, RECONSIDER IF COUNTS DROP, LATER BUT NOT RELIABLE DURING ACUTE INFECTION AND FEVER, CHECK IRON STORES.....Marland KitchenNEED TO CHECK WITH PRIMRY NEPHROLOGY, RE BASELINE BLOOD COUNTS, CRE, AND IF DRUGS OR RENAL PATHOLOGY OR REJECTION CAN MANIFEST ANY OF CURRENT SYMPTOMS, AND THEIR EXPERIENCE WITH FEBRILE ILLNESS IN TRANSPLANT PATIENTS.Marland KitchenMarland KitchenIF FEVER/CHILLS PERSIST WOULD CONSULT ID, IMMUNOCOMPROMIZED HOST WITH FEBRILE ILLNESS.  Electronic Signatures: Dallas Schimke (MD)  (Signed 09-Apr-15 21:16)  Authored: Brief Consult Note   Last Updated: 09-Apr-15 21:16 by Dallas Schimke (MD)

## 2015-04-22 NOTE — H&P (Signed)
PATIENT NAME:  Eric Frey, Eric Frey MR#:  [id number] DATE OF BIRTH:  [birth year]  DATE OF ADMISSION:  03/20/2014  REFERRING PHYSICIAN:  Ahmed Prima, MD  FAMILY PHYSICIAN: Nonlocal.   REASON FOR ADMISSION: Acute on chronic renal failure with dehydration.   HISTORY OF PRESENT ILLNESS: The patient is a 34 year old male with a history of renal transplant in 2008, also has history of diabetes. Presents to the Emergency Room with a 3 to 4-day history of intractable nausea, vomiting and diarrhea. Has been unable to keep his medications down. In the Emergency Room, the patient was found to be dehydrated, tachycardic with acute on chronic renal failure, was also noted to be hyperglycemic. He is now admitted for further evaluation.   PAST MEDICAL HISTORY: 1.  End-stage renal disease, status post renal transplant.  2.  Benign hypertension.  3.  Type 2 diabetes.  4.  History of depression.   MEDICATIONS:  1.  Sodium bicarbonate 650 mg p.o. q.i.d.  2.  Prograf 2 mg p.o. b.i.d.  3.  Prednisone 10 mg p.o. daily.  4.  Actos 30 mg p.o. daily.  5.  Omeprazole 20 mg p.o. daily.  6.  Lisinopril 10 mg p.o. daily.  7.  CellCept 2000 mg p.o. b.i.d.  8.  Amaryl 2 mg p.o. b.i.d.  9.  Norvasc 10 mg p.o. daily.   ALLERGIES: No known drug allergies.   SOCIAL HISTORY: Negative for alcohol or tobacco abuse.   FAMILY HISTORY: Positive for diabetes, hypertension, but otherwise unremarkable.   REVIEW OF SYSTEMS:    CONSTITUTIONAL: The patient denies fevers. No change in weight.  EYES: No blurred or double vision. No glaucoma.  ENT: No tinnitus or hearing loss. No nasal discharge or bleeding. No difficulty swallowing.  RESPIRATORY: No cough or wheezing. Denies painful respiration or hemoptysis.  CARDIOVASCULAR: No chest pain or orthopnea. No syncope.  GASTROINTESTINAL: Some generalized abdominal pain associated with the nausea, vomiting, diarrhea.  GENITOURINARY: No dysuria or hematuria. No incontinence.   ENDOCRINE: No polyuria or polydipsia. No heat or cold intolerance.  HEMATOLOGIC: The patient denies anemia, easy bruising or bleeding.  LYMPHATIC: No swollen glands.  MUSCULOSKELETAL: The patient denies pain in his neck, back, shoulders, knees or hips. No gout.  NEUROLOGIC: No numbness or migraines. Denies stroke or seizures.  PSYCHIATRIC: The patient denies anxiety, insomnia or depression.   PHYSICAL EXAMINATION: GENERAL: The patient is in no acute distress.  VITAL SIGNS: Remarkable for a blood pressure of 149/80, heart rate 92, respiratory rate of 18, temperature of 97.2.  HEENT: Normocephalic, atraumatic. Pupils equally round and reactive to light and accommodation. Extraocular movements are intact. Sclerae are anicteric. Conjunctivae are clear. Oropharynx is dry but clear.  NECK: Supple without JVD. No lymphadenopathy or thyromegaly is noted.  LUNGS: Clear to auscultation and percussion without wheezes, rales or rhonchi. No dullness. Respiratory effort is normal.  CARDIAC: Regular rate and rhythm with normal S1, S2. No significant rubs, murmurs or gallops. PMI is nondisplaced. Chest wall is nontender.  ABDOMEN: Soft but diffusely tender. No rebound or guarding. Normoactive bowel sounds. No organomegaly or masses were appreciated. No hernias or bruits were noted.  EXTREMITIES: Without clubbing, cyanosis or edema. Pulses were 2+ bilaterally.  SKIN: Warm and dry without rash or lesions.  NEUROLOGIC: Cranial nerves II through XII grossly intact. Deep tendon reflexes were symmetric. Motor and sensory exams nonfocal.  PSYCHIATRIC: Revealed a patient who was alert and oriented to person, place and time. He was cooperative and used good  judgment.   LABORATORY, DIAGNOSTIC AND RADIOLOGICAL DATA:  Urinalysis revealed 1+ blood with 18 WBCs per high-power field but no bacteria. Glucose was 322 with a BUN of 31, creatinine of 2.74 and a GFR of 29. Lipase 232. Sodium 133, potassium of 4.2. White count was  12.3 with a hemoglobin of 12.0.   ASSESSMENT: 1.  Intractable nausea and vomiting.  2.  Dehydration.  3.  Acute on chronic renal failure.  4.  Presumed gastroenteritis.  5.  Type 2 diabetes with hyperglycemia.  6.  Anemia of chronic disease.   PLAN: The patient will be admitted to the floor with IV fluids and a clear liquid diet. We will use Zofran as needed for nausea and vomiting, and Lomotil as needed for diarrhea. We will follow his sugars closely. Consult nephrology. Follow up labs in the morning. We will obtain a KUB now and consider further imaging of the kidneys after nephrology consultation. Further treatment and evaluation will depend upon the patient's progress.   TOTAL TIME SPENT ON THIS PATIENT: 50 minutes.    ____________________________ Leonie Douglas Doy Hutching, MD jds:cs D: 03/20/2014 16:23:52 ET T: 03/20/2014 18:48:11 ET JOB#: [id number]  cc: Leonie Douglas. Doy Hutching, MD, <Dictator> Brendaliz Kuk Lennice Sites MD ELECTRONICALLY SIGNED 03/21/2014 8:07

## 2015-08-23 DIAGNOSIS — Z794 Long term (current) use of insulin: Secondary | ICD-10-CM | POA: Insufficient documentation

## 2015-08-25 IMAGING — CR DG ABDOMEN 1V
2 series · 5 of 5 positions shown · non-contrast
Comparison: None.

CLINICAL DATA: Nausea, vomiting and diarrhea for 3 days. Renal
failure. History of kidney transplant in 2009.

EXAM:
ABDOMEN - 1 VIEW

[Series 1: supine kub · 0.17mm/px · 4 of 4 slices shown (1 of 2)]
[im 1/4]
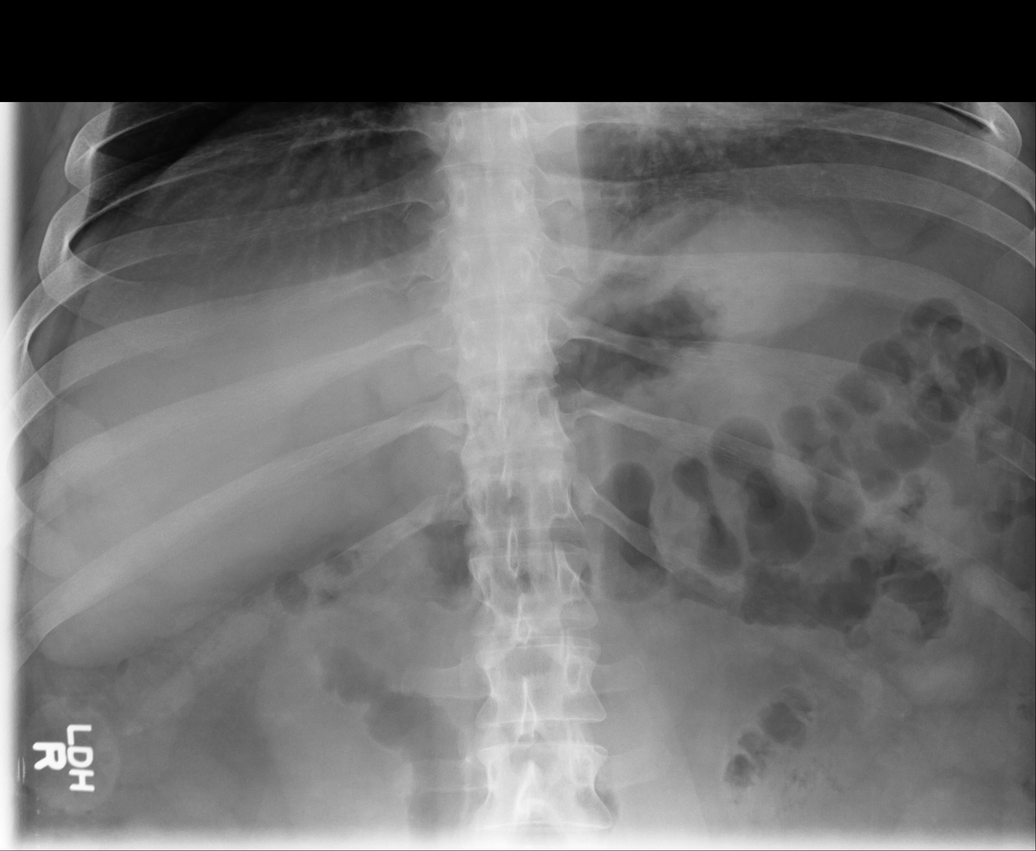
[im 2/4]
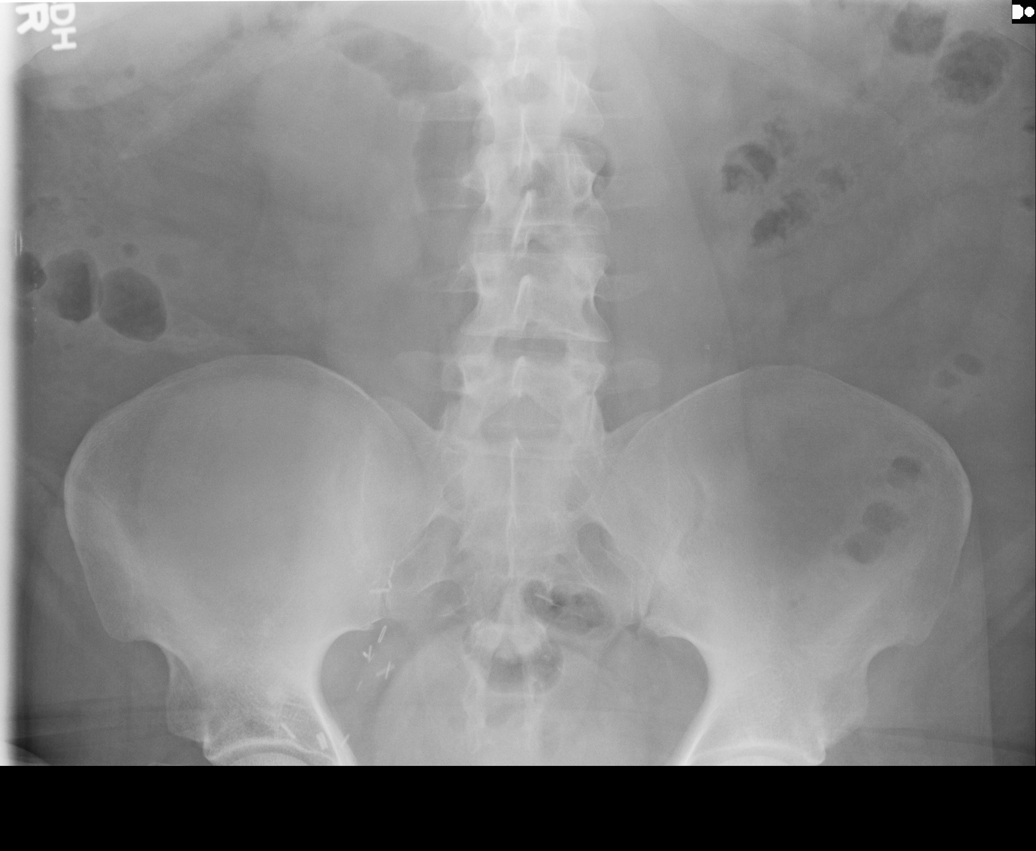
[im 3/4]
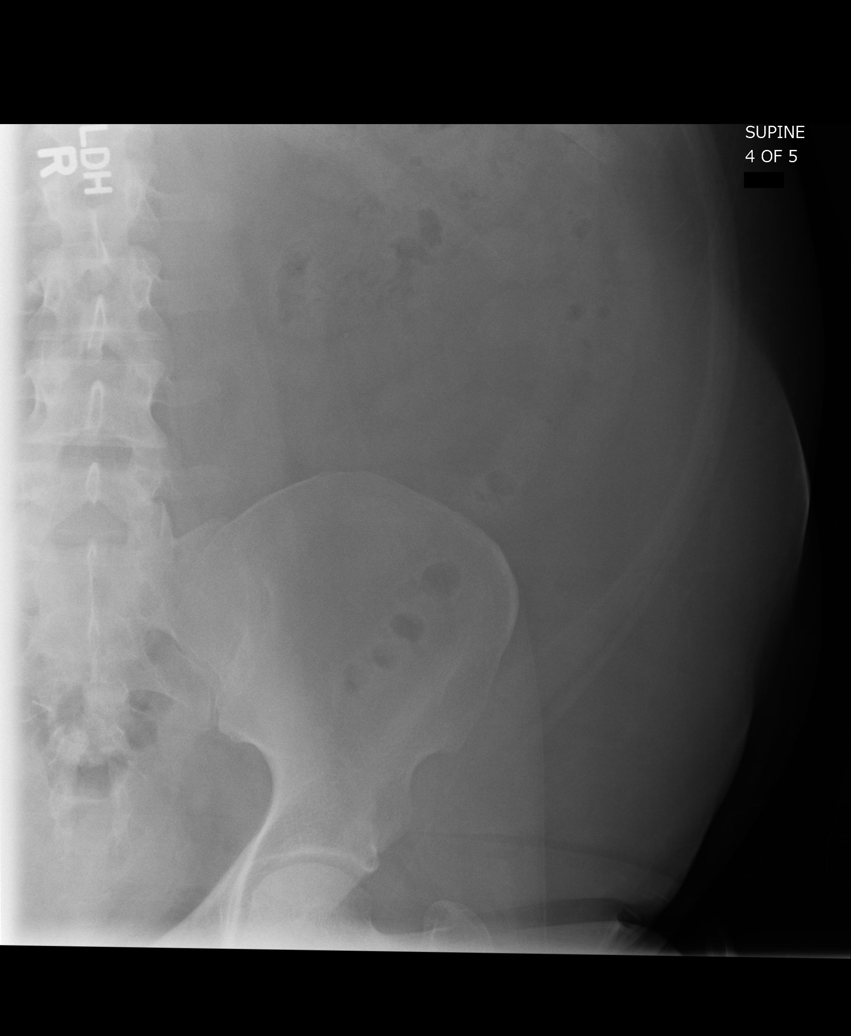
[im 4/4]
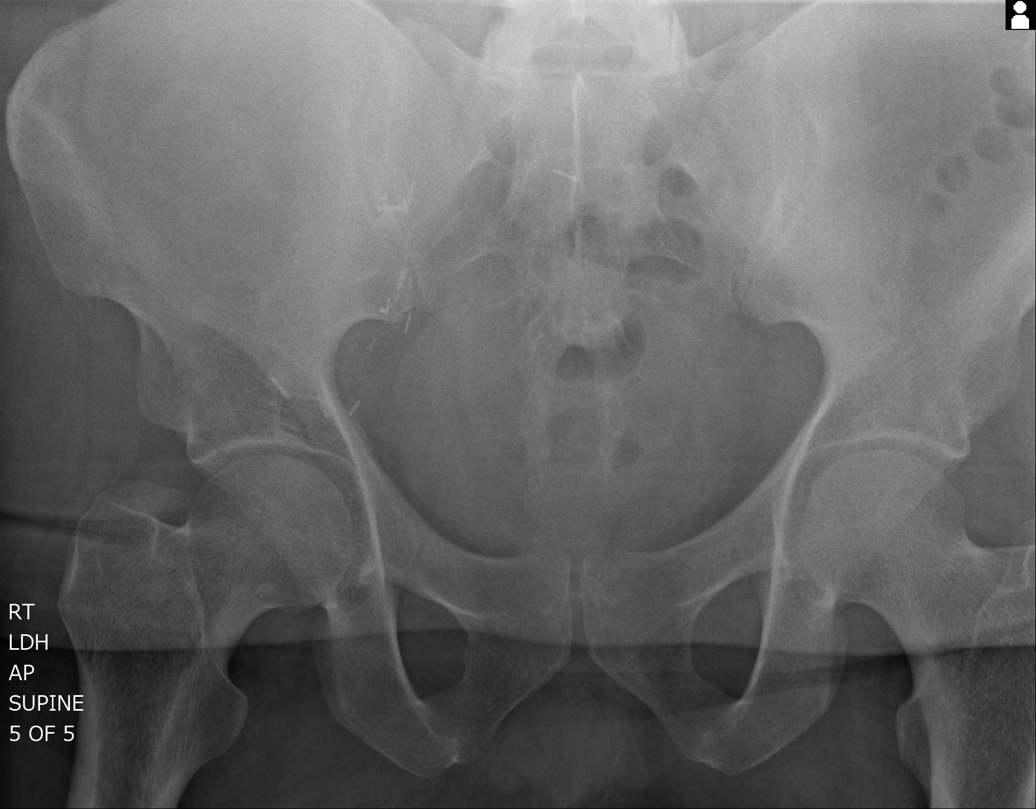

[supine kub (2 of 2)]
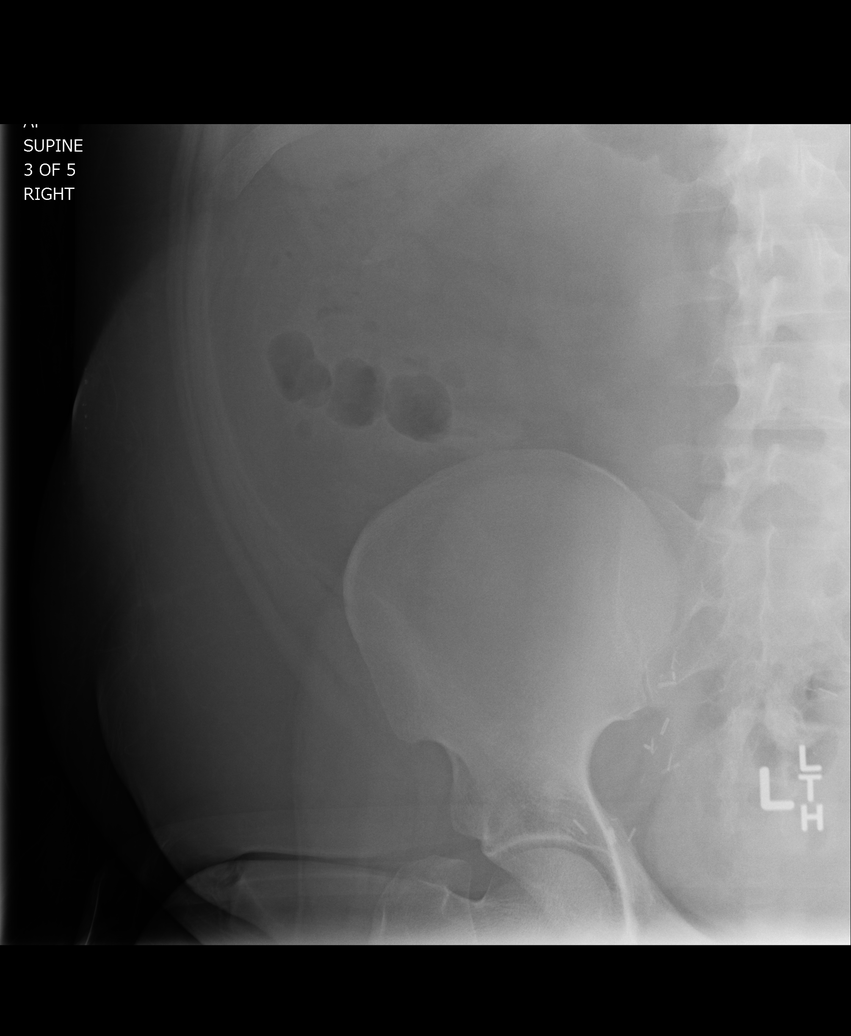

[5 of 5 positions shown; findings below may reference images not displayed]

FINDINGS: There is a relative paucity of small bowel gas, with gas seen in a
few loops of nondilated small bowel. Gas and a small amount of stool
are present in nondilated colon. No gross intraperitoneal free air
is identified. No abnormal calcification is seen. Surgical clips are
present in the right pelvis. No acute osseous abnormality is
identified.
IMPRESSION: Nonobstructed bowel-gas pattern.

## 2016-01-04 DIAGNOSIS — F1911 Other psychoactive substance abuse, in remission: Secondary | ICD-10-CM | POA: Insufficient documentation

## 2016-07-09 DIAGNOSIS — D849 Immunodeficiency, unspecified: Secondary | ICD-10-CM | POA: Insufficient documentation

## 2017-05-06 ENCOUNTER — Emergency Department
Admission: EM | Admit: 2017-05-06 | Discharge: 2017-05-07 | Disposition: A | Discharge status: Home / Self Care | Payer: Medicare Other | Attending: Emergency Medicine | Admitting: Emergency Medicine

## 2017-05-06 ENCOUNTER — Encounter: Payer: Self-pay | Admitting: Emergency Medicine

## 2017-05-06 ENCOUNTER — Emergency Department: Payer: Medicare Other

## 2017-05-06 DIAGNOSIS — N189 Chronic kidney disease, unspecified: Secondary | ICD-10-CM | POA: Diagnosis not present

## 2017-05-06 DIAGNOSIS — I129 Hypertensive chronic kidney disease with stage 1 through stage 4 chronic kidney disease, or unspecified chronic kidney disease: Secondary | ICD-10-CM | POA: Diagnosis not present

## 2017-05-06 DIAGNOSIS — Z7982 Long term (current) use of aspirin: Secondary | ICD-10-CM | POA: Insufficient documentation

## 2017-05-06 DIAGNOSIS — Z794 Long term (current) use of insulin: Secondary | ICD-10-CM | POA: Diagnosis not present

## 2017-05-06 DIAGNOSIS — Z940 Kidney transplant status: Secondary | ICD-10-CM | POA: Diagnosis not present

## 2017-05-06 DIAGNOSIS — N179 Acute kidney failure, unspecified: Secondary | ICD-10-CM

## 2017-05-06 DIAGNOSIS — E1122 Type 2 diabetes mellitus with diabetic chronic kidney disease: Secondary | ICD-10-CM | POA: Insufficient documentation

## 2017-05-06 DIAGNOSIS — R109 Unspecified abdominal pain: Secondary | ICD-10-CM | POA: Diagnosis present

## 2017-05-06 HISTORY — DX: Essential (primary) hypertension: I10

## 2017-05-06 HISTORY — DX: Type 2 diabetes mellitus without complications: E11.9

## 2017-05-06 HISTORY — DX: Disorder of kidney and ureter, unspecified: N28.9

## 2017-05-06 LAB — CBC
HEMATOCRIT: 28.8 % — AB (ref 40.0–52.0)
HEMOGLOBIN: 9.8 g/dL — AB (ref 13.0–18.0)
MCH: 29.7 pg (ref 26.0–34.0)
MCHC: 34.1 g/dL (ref 32.0–36.0)
MCV: 87.1 fL (ref 80.0–100.0)
Platelets: 251 10*3/uL (ref 150–440)
RBC: 3.31 MIL/uL — AB (ref 4.40–5.90)
RDW: 17.5 % — AB (ref 11.5–14.5)
WBC: 7.9 10*3/uL (ref 3.8–10.6)

## 2017-05-06 LAB — COMPREHENSIVE METABOLIC PANEL
ALBUMIN: 4.1 g/dL (ref 3.5–5.0)
ALK PHOS: 57 U/L (ref 38–126)
ALT: 14 U/L — ABNORMAL LOW (ref 17–63)
ANION GAP: 10 (ref 5–15)
AST: 23 U/L (ref 15–41)
BILIRUBIN TOTAL: 0.5 mg/dL (ref 0.3–1.2)
BUN: 52 mg/dL — ABNORMAL HIGH (ref 6–20)
CALCIUM: 9.4 mg/dL (ref 8.9–10.3)
CO2: 22 mmol/L (ref 22–32)
Chloride: 105 mmol/L (ref 101–111)
Creatinine, Ser: 3.7 mg/dL — ABNORMAL HIGH (ref 0.61–1.24)
GFR calc non Af Amer: 20 mL/min — ABNORMAL LOW (ref >60)
GFR, EST AFRICAN AMERICAN: 23 mL/min — AB (ref >60)
GLUCOSE: 172 mg/dL — AB (ref 65–99)
POTASSIUM: 5.2 mmol/L — AB (ref 3.5–5.1)
SODIUM: 137 mmol/L (ref 135–145)
TOTAL PROTEIN: 6.7 g/dL (ref 6.5–8.1)

## 2017-05-06 LAB — GLUCOSE, CAPILLARY
Glucose-Capillary: 174 mg/dL — ABNORMAL HIGH (ref 65–99)
Glucose-Capillary: 140 mg/dL — ABNORMAL HIGH (ref 65–99)

## 2017-05-06 LAB — BASIC METABOLIC PANEL
Anion gap: 7 (ref 5–15)
BUN: 46 mg/dL — AB (ref 6–20)
CHLORIDE: 108 mmol/L (ref 101–111)
CO2: 23 mmol/L (ref 22–32)
Calcium: 9 mg/dL (ref 8.9–10.3)
Creatinine, Ser: 2.97 mg/dL — ABNORMAL HIGH (ref 0.61–1.24)
GFR calc Af Amer: 30 mL/min — ABNORMAL LOW (ref >60)
GFR calc non Af Amer: 26 mL/min — ABNORMAL LOW (ref >60)
GLUCOSE: 144 mg/dL — AB (ref 65–99)
Potassium: 5.7 mmol/L — ABNORMAL HIGH (ref 3.5–5.1)
Sodium: 138 mmol/L (ref 135–145)

## 2017-05-06 LAB — URINALYSIS, COMPLETE (UACMP) WITH MICROSCOPIC
BILIRUBIN URINE: NEGATIVE
Glucose, UA: 50 mg/dL — AB
HGB URINE DIPSTICK: NEGATIVE
Ketones, ur: NEGATIVE mg/dL
LEUKOCYTES UA: NEGATIVE
NITRITE: NEGATIVE
PH: 6 (ref 5.0–8.0)
Protein, ur: 30 mg/dL — AB
SPECIFIC GRAVITY, URINE: 1.012 (ref 1.005–1.030)
Squamous Epithelial / LPF: NONE SEEN

## 2017-05-06 LAB — LIPASE, BLOOD: Lipase: 45 U/L (ref 11–51)

## 2017-05-06 MED ORDER — MORPHINE SULFATE (PF) 4 MG/ML IV SOLN
4.0000 mg | Freq: Once | INTRAVENOUS | Status: AC
  Administered 2017-05-06: 4 mg via INTRAVENOUS
  Filled 2017-05-06: qty 1

## 2017-05-06 MED ORDER — INSULIN GLARGINE 100 UNIT/ML ~~LOC~~ SOLN
28.0000 [IU] | Freq: Every day | SUBCUTANEOUS | Status: DC
  Administered 2017-05-06: 28 [IU] via SUBCUTANEOUS
  Filled 2017-05-06: qty 0.28

## 2017-05-06 MED ORDER — LISINOPRIL 10 MG PO TABS
10.0000 mg | ORAL_TABLET | Freq: Every day | ORAL | Status: DC
  Administered 2017-05-06: 10 mg via ORAL
  Filled 2017-05-06 (×2): qty 1

## 2017-05-06 MED ORDER — PIOGLITAZONE HCL 30 MG PO TABS
30.0000 mg | ORAL_TABLET | Freq: Every day | ORAL | Status: DC
  Filled 2017-05-06: qty 1

## 2017-05-06 MED ORDER — MYCOPHENOLATE MOFETIL 250 MG PO CAPS
1000.0000 mg | ORAL_CAPSULE | Freq: Two times a day (BID) | ORAL | Status: DC
  Administered 2017-05-06: 1000 mg via ORAL
  Filled 2017-05-06 (×2): qty 4

## 2017-05-06 MED ORDER — INSULIN ASPART 100 UNIT/ML ~~LOC~~ SOLN: 20.0000 [IU] | Freq: Three times a day (TID) | SUBCUTANEOUS | Status: DC

## 2017-05-06 MED ORDER — PANTOPRAZOLE SODIUM 40 MG PO TBEC
40.0000 mg | DELAYED_RELEASE_TABLET | Freq: Every day | ORAL | Status: DC
  Administered 2017-05-06: 40 mg via ORAL
  Filled 2017-05-06: qty 1

## 2017-05-06 MED ORDER — ONDANSETRON HCL 4 MG/2ML IJ SOLN
4.0000 mg | Freq: Once | INTRAMUSCULAR | Status: AC
  Administered 2017-05-06: 4 mg via INTRAVENOUS
  Filled 2017-05-06: qty 2

## 2017-05-06 MED ORDER — TACROLIMUS 1 MG PO CAPS
1.0000 mg | ORAL_CAPSULE | Freq: Two times a day (BID) | ORAL | Status: DC
Start: 2017-05-06 — End: 2017-05-06
  Filled 2017-05-06: qty 1

## 2017-05-06 MED ORDER — CITALOPRAM HYDROBROMIDE 20 MG PO TABS
40.0000 mg | ORAL_TABLET | Freq: Every day | ORAL | Status: DC
  Administered 2017-05-06: 40 mg via ORAL
  Filled 2017-05-06: qty 2

## 2017-05-06 MED ORDER — ACETAMINOPHEN 500 MG PO TABS
1000.0000 mg | ORAL_TABLET | Freq: Three times a day (TID) | ORAL | Status: DC
  Administered 2017-05-06 – 2017-05-07 (×2): 1000 mg via ORAL
  Filled 2017-05-06 (×2): qty 2

## 2017-05-06 MED ORDER — TACROLIMUS 1 MG PO CAPS
2.0000 mg | ORAL_CAPSULE | Freq: Two times a day (BID) | ORAL | Status: DC
  Administered 2017-05-06: 2 mg via ORAL
  Filled 2017-05-06 (×2): qty 2

## 2017-05-06 MED ORDER — CARVEDILOL 25 MG PO TABS: 25.0000 mg | ORAL_TABLET | Freq: Two times a day (BID) | ORAL | Status: DC

## 2017-05-06 MED ORDER — AMLODIPINE BESYLATE 5 MG PO TABS
10.0000 mg | ORAL_TABLET | Freq: Every day | ORAL | Status: DC
  Administered 2017-05-06: 10 mg via ORAL
  Filled 2017-05-06: qty 2

## 2017-05-06 MED ORDER — VALGANCICLOVIR HCL 450 MG PO TABS
450.0000 mg | ORAL_TABLET | Freq: Two times a day (BID) | ORAL | Status: DC
  Administered 2017-05-06: 450 mg via ORAL
  Filled 2017-05-06 (×2): qty 1

## 2017-05-06 MED ORDER — OXYCODONE HCL 5 MG PO TABS
5.0000 mg | ORAL_TABLET | ORAL | Status: DC | PRN
  Administered 2017-05-06 – 2017-05-07 (×4): 5 mg via ORAL
  Filled 2017-05-06 (×4): qty 1

## 2017-05-06 MED ORDER — SODIUM CHLORIDE 0.9 % IV SOLN
Freq: Once | INTRAVENOUS | Status: AC
  Administered 2017-05-06: 13:00:00 via INTRAVENOUS

## 2017-05-06 MED ORDER — ACETAMINOPHEN 325 MG PO TABS: 650.0000 mg | ORAL_TABLET | Freq: Four times a day (QID) | ORAL | Status: DC | PRN

## 2017-05-06 MED ORDER — MORPHINE SULFATE (PF) 4 MG/ML IV SOLN
6.0000 mg | Freq: Once | INTRAVENOUS | Status: AC
  Administered 2017-05-06: 6 mg via INTRAVENOUS
  Filled 2017-05-06: qty 2

## 2017-05-06 MED ORDER — ASPIRIN EC 81 MG PO TBEC
81.0000 mg | DELAYED_RELEASE_TABLET | Freq: Every day | ORAL | Status: DC
  Administered 2017-05-06: 81 mg via ORAL
  Filled 2017-05-06: qty 1

## 2017-05-06 MED ORDER — INSULIN ASPART 100 UNIT/ML ~~LOC~~ SOLN
10.0000 [IU] | Freq: Once | SUBCUTANEOUS | Status: AC
  Administered 2017-05-06: 10 [IU] via SUBCUTANEOUS
  Filled 2017-05-06: qty 10

## 2017-05-06 MED ORDER — SODIUM CHLORIDE 0.9 % IV BOLUS (SEPSIS)
1000.0000 mL | Freq: Once | INTRAVENOUS | Status: AC
  Administered 2017-05-06: 1000 mL via INTRAVENOUS

## 2017-05-06 MED ORDER — CARVEDILOL 25 MG PO TABS
25.0000 mg | ORAL_TABLET | Freq: Two times a day (BID) | ORAL | Status: DC
  Administered 2017-05-06: 25 mg via ORAL
  Filled 2017-05-06: qty 1

## 2017-05-06 MED ORDER — GLIMEPIRIDE 4 MG PO TABS
4.0000 mg | ORAL_TABLET | Freq: Every day | ORAL | Status: DC
  Filled 2017-05-06: qty 1

## 2017-05-06 MED ORDER — PREDNISONE 10 MG PO TABS: 5.0000 mg | ORAL_TABLET | Freq: Every day | ORAL | Status: DC

## 2017-05-06 MED ORDER — SULFAMETHOXAZOLE-TRIMETHOPRIM 800-160 MG PO TABS: 1.0000 | ORAL_TABLET | ORAL | Status: DC

## 2017-05-06 NOTE — ED Notes (Signed)
CALLED  [facility]  PT  STILL ON  WAITLIST  INFORMED  DR  Alfred Levins  MD

## 2017-05-06 NOTE — ED Notes (Signed)
[facility] for consult [phone]

## 2017-05-06 NOTE — ED Triage Notes (Signed)
States 3 weeks ago patient's 35 year old jumped on right side.  C/O right sided pain for 3 weeks with movement with pain progressively worsening.  This morning patient states he was coughing he felt something pop and pain is worse.

## 2017-05-06 NOTE — ED Notes (Signed)
[place] ON  WAITLIST  INFORMED  DR  Joni Fears  MD AND AMBER  RN

## 2017-05-06 NOTE — ED Provider Notes (Signed)
[facility] Emergency Department Provider Note  ____________________________________________  Time seen: Approximately 8:42 AM  I have reviewed the triage vital signs and the nursing notes.   HISTORY  Chief Complaint Flank Pain   HPI Eric Frey is a 36 y.o. male with a history of kidney transplant in 2018 on CellCept andtacrolimus, obesity, diabetes, hypertension who presents for evaluation of right-sided abdominal pain. Patient reports that 2 weeks ago his 85-year-old jumped on his belly. Since then he has been having right-sided abdominal pain that has been constant and getting progressively worse. The pain this morning was severe, worse than its been before, associated with nausea. The pain is located in the right lateral part of his abdomen and nonradiating. The pain is sharp in quality. He endorses compliance with his medications. His kidney transplant is located in his right pelvic region. He has been urinating normal. His baseline creatinine is 1.9-2.4. [facility] is [facility] at [facility]. No fever or chills, no dysuria or hematuria, no cough or congestion, no shortness of breath.  Past Medical History:  Diagnosis Date  . Diabetes mellitus without complication ([place])   . Hypertension   . Renal disorder    kidney transplant 2008    There are no active problems to display for this patient.   Past Surgical History:  Procedure Laterality Date  . KIDNEY TRANSPLANT      Prior to Admission medications   Not on File    Allergies Patient has no known allergies.  No family history on file.  Social History Social History  Substance Use Topics  . Smoking status: Never Smoker  . Smokeless tobacco: Never Used  . Alcohol use No    Review of Systems  Constitutional: Negative for fever. Eyes: Negative for visual changes. ENT: Negative for sore throat. Neck: No neck pain  Cardiovascular: Negative for chest pain. Respiratory: Negative for shortness of  breath. Gastrointestinal: + right sided abdominal pain and nausea. No vomiting or diarrhea. Genitourinary: Negative for dysuria. Musculoskeletal: Negative for back pain. Skin: Negative for rash. Neurological: Negative for headaches, weakness or numbness. Psych: No SI or HI  ____________________________________________   PHYSICAL EXAM:  VITAL SIGNS: ED Triage Vitals  Enc Vitals Group     BP 05/06/17 0708 (!) 144/63     Pulse Rate 05/06/17 0708 80     Resp 05/06/17 0708 16     Temp 05/06/17 0708 97.7 F (36.5 C)     Temp Source 05/06/17 0708 Oral     SpO2 05/06/17 0708 98 %     Weight 05/06/17 0710 (!) 350 lb (158.8 kg)     Height 05/06/17 0710 5\' 11"  (1.803 m)     Head Circumference --      Peak Flow --      Pain Score 05/06/17 0707 9     Pain Loc --      Pain Edu? --      Excl. in [place]? --     Constitutional: Alert and oriented. Well appearing and in no apparent distress. HEENT:      Head: Normocephalic and atraumatic.         Eyes: Conjunctivae are normal. Sclera is non-icteric. EOMI. PERRL      Mouth/Throat: Mucous membranes are moist.       Neck: Supple with no signs of meningismus. Cardiovascular: Regular rate and rhythm. No murmurs, gallops, or rubs. 2+ symmetrical distal pulses are present in all extremities. No JVD. Respiratory: Normal respiratory effort. Lungs are  clear to auscultation bilaterally. No wheezes, crackles, or rhonchi.  Gastrointestinal: Obese, ttp over the lateral Right middle part of his abdomen, with no bruising, no RUQ or RLQ ttp, no ttp over the transplanted kidney Genitourinary: No CVA tenderness. Musculoskeletal: Nontender with normal range of motion in all extremities. No edema, cyanosis, or erythema of extremities. Neurologic: Normal speech and language. Face is symmetric. Moving all extremities. No gross focal neurologic deficits are appreciated. Skin: Skin is warm, dry and intact. No rash noted. Psychiatric: Mood and affect are normal.  Speech and behavior are normal.  ____________________________________________   LABS (all labs ordered are listed, but only abnormal results are displayed)  Labs Reviewed  COMPREHENSIVE METABOLIC PANEL - Abnormal; Notable for the following:       Result Value   Potassium 5.2 (*)    Glucose, Bld 172 (*)    BUN 52 (*)    Creatinine, Ser 3.70 (*)    ALT 14 (*)    GFR calc non Af Amer 20 (*)    GFR calc Af Amer 23 (*)    All other components within normal limits  CBC - Abnormal; Notable for the following:    RBC 3.31 (*)    Hemoglobin 9.8 (*)    HCT 28.8 (*)    RDW 17.5 (*)    All other components within normal limits  URINALYSIS, COMPLETE (UACMP) WITH MICROSCOPIC - Abnormal; Notable for the following:    Color, Urine YELLOW (*)    APPearance CLEAR (*)    Glucose, UA 50 (*)    Protein, ur 30 (*)    Bacteria, UA RARE (*)    All other components within normal limits  LIPASE, BLOOD  BASIC METABOLIC PANEL   ____________________________________________  EKG  none ____________________________________________  RADIOLOGY  CT a/p: Negative  ____________________________________________   PROCEDURES  Procedure(s) performed: None Procedures Critical Care performed:  None ____________________________________________   INITIAL IMPRESSION / ASSESSMENT AND PLAN / ED COURSE   36 y.o. male with a history of kidney transplant in 2018 on CellCept andtacrolimus, obesity, diabetes, hypertension who presents for evaluation of right-sided abdominal pain x 2 weeks since his son jumped on his abdomen. Patient is in no distress, he is tender to palpation on the right lateral aspect of his abdomen with no bruising, no chest wall tenderness, no right upper quadrant, flank, or right lower quadrant tenderness, no tenderness over the transplanted kidney. CT abdomen and pelvis with no acute findings. Labs showing worsening kidney function with a creatinine of 3.7 (patient's baseline is 1.9-2.4).  Patient was given IV fluids, Dilaudid and Zofran for symptom relief. We'll discuss with [facility] for transfer for worsening kidney function.  Clinical Course as of May 07 1523  Tue May 06, 2017  1057 Spoke with [place] nephrology who will look over the patient's chart and call me back for further management.   [CV]  3546 Dr. Radene Knee, nephrology [facility] recommended transfer to [facility].   [CV]  1502 Still no bed available at [facility]. Patient receiving IV hydration. Patient has appointment with his nephrologist tomorrow at [place]. Will continue to monitor in the ED, repeat creatinine this evening and if patient still here by tomorrow 7Am will dc home so he can make to his appointment and be directly admitted from nephrologist's office if needed.   [CV]    Clinical Course User Index [CV] Rudene Re, MD    Pertinent labs & imaging results that were available during my care of the patient were  reviewed by me and considered in my medical decision making (see chart for details).    ____________________________________________   FINAL CLINICAL IMPRESSION(S) / ED DIAGNOSES  Final diagnoses:  Acute renal failure superimposed on chronic kidney disease, unspecified CKD stage, unspecified acute renal failure type [facility])  Kidney transplant recipient      NEW MEDICATIONS STARTED DURING THIS VISIT:  New Prescriptions   No medications on file     Note:  This document was prepared using Dragon voice recognition software and may include unintentional dictation errors.    Alfred Levins, [place], MD 05/06/17 [phone]

## 2017-05-07 NOTE — Discharge Instructions (Signed)
Due to your worsening kidney function, we attempted to transfer you to [facility], but they did not have any beds available.  Since you have an appointment with your nephrologist this morning, we are discharging you at your request to go to that appointment.  PLEASE DO NOT MISS THIS APPOINTMENT!  Return to the nearest emergency department if he develop new or worsening symptoms.  Please let your doctor know that our records including lab results will be available in [place] on the computer.

## 2017-05-07 NOTE — ED Provider Notes (Signed)
Clinical Course as of May 07 458  Tue May 06, 2017  1057 Spoke with [place] nephrology who will look over the patient's chart and call me back for further management.   [CV]  2694 Dr. Radene Knee, nephrology [facility] recommended transfer to [facility].   [CV]  1502 Still no bed available at [facility]. Patient receiving IV hydration. Patient has appointment with his nephrologist tomorrow at [place]. Will continue to monitor in the ED, repeat creatinine this evening and if patient still here by tomorrow 7Am will dc home so he can make to his appointment and be directly admitted from nephrologist's office if needed.   [CV]  Wed May 07, 2017  0025 Patient stable.  If patient still has not been transferred by 7am, we will discharge him to go to his nephrology appointment at [facility] in [facility].    [CF]  [phone] The patient is stable and in no acute distress.  His only opportunity or a ride to the nephrology appointment this morning is that his parents picked him up right now and dropped him off.  He received IV fluids and his creatinine was coming down when it was rechecked.  He understands the importance of immediate follow-up and he certainly has the capacity to make his own decisions.  I will discharge him at his request since [facility] has not been able to accept him as a transfer.  [CF]    Clinical Course User Index [CF] Hinda Kehr, MD [CV] Rudene Re, MD      Hinda Kehr, MD 05/07/17 [phone]

## 2017-06-30 MED ORDER — PROGRAF 0.5 MG CAPSULE
ORAL_CAPSULE | Freq: Two times a day (BID) | ORAL | 11 refills | 0 days | Status: CP
Start: 2017-06-30 — End: 2017-07-04

## 2017-07-04 ENCOUNTER — Ambulatory Visit
Admission: RE | Admit: 2017-07-04 | Discharge: 2017-07-04 | Disposition: A | Discharge status: Home / Self Care | Payer: MEDICARE | Attending: Nephrology | Admitting: Nephrology

## 2017-07-04 ENCOUNTER — Ambulatory Visit
Admission: RE | Admit: 2017-07-04 | Discharge: 2017-07-04 | Disposition: A | Discharge status: Home / Self Care | Payer: MEDICARE

## 2017-07-04 DIAGNOSIS — Z940 Kidney transplant status: Principal | ICD-10-CM

## 2017-07-04 DIAGNOSIS — Z0000 Encounter for general adult medical examination without abnormal findings: Secondary | ICD-10-CM

## 2017-07-04 DIAGNOSIS — E559 Vitamin D deficiency, unspecified: Secondary | ICD-10-CM

## 2017-07-04 DIAGNOSIS — E119 Type 2 diabetes mellitus without complications: Secondary | ICD-10-CM

## 2017-07-04 MED ORDER — PROGRAF 0.5 MG CAPSULE
ORAL_CAPSULE | Freq: Two times a day (BID) | ORAL | 11 refills | 0.00000 days | Status: CP
Start: 2017-07-04 — End: 2017-07-09

## 2017-07-07 MED ORDER — CARVEDILOL 25 MG TABLET
ORAL_TABLET | 11 refills | 0 days | Status: CP
Start: 2017-07-07 — End: 2018-06-29

## 2017-07-09 MED ORDER — TACROLIMUS 0.5 MG CAPSULE
ORAL_CAPSULE | Freq: Two times a day (BID) | ORAL | 3 refills | 0 days | Status: CP
Start: 2017-07-09 — End: 2017-08-04

## 2017-08-01 ENCOUNTER — Ambulatory Visit
Admission: RE | Admit: 2017-08-01 | Discharge: 2017-08-01 | Disposition: A | Discharge status: Home / Self Care | Payer: MEDICARE

## 2017-08-01 DIAGNOSIS — E559 Vitamin D deficiency, unspecified: Secondary | ICD-10-CM

## 2017-08-01 DIAGNOSIS — Z940 Kidney transplant status: Principal | ICD-10-CM

## 2017-08-01 DIAGNOSIS — E119 Type 2 diabetes mellitus without complications: Secondary | ICD-10-CM

## 2017-08-04 LAB — CMV DNA, QUANTITATIVE, PCR

## 2017-08-04 LAB — CMV QUANT LOG10: Lab: 0

## 2017-08-04 MED ORDER — TACROLIMUS 0.5 MG CAPSULE
ORAL_CAPSULE | 3 refills | 0 days | Status: CP
Start: 2017-08-04 — End: 2017-10-17

## 2017-08-04 NOTE — Unmapped (Signed)
Reviewed FK 4.4 with Dr Carlene Coria. Pt advised to increase prograf dose to 1 mg in the AM and 0.5 PM. Will follow up on repeat labs.

## 2017-08-05 LAB — BK BLOOD RESULT: Lab: DETECTED — AB

## 2017-08-05 LAB — BK VIRAL LOAD (QUANTITATIVE)

## 2017-08-05 NOTE — Unmapped (Signed)
PT MUST PAY $30 TO REOPEN ACCOUNT BEFORE WE CAN FILL ANYTHING FOR HIM; PT AND HEATHER AND JENN OWEN ARE AWARE FROM PREVIOUS CONVERSATIONS

## 2017-08-07 ENCOUNTER — Inpatient Hospital Stay
Admission: EM | Admit: 2017-08-07 | Discharge: 2017-08-08 | Disposition: A | Discharge status: Home / Self Care | Payer: MEDICARE | Source: Intra-hospital | Attending: Internal Medicine

## 2017-08-07 ENCOUNTER — Inpatient Hospital Stay
Admission: EM | Admit: 2017-08-07 | Discharge: 2017-08-08 | Disposition: A | Discharge status: Home / Self Care | Payer: MEDICARE | Source: Intra-hospital

## 2017-08-07 DIAGNOSIS — Z940 Kidney transplant status: Principal | ICD-10-CM

## 2017-08-07 DIAGNOSIS — E119 Type 2 diabetes mellitus without complications: Secondary | ICD-10-CM

## 2017-08-07 DIAGNOSIS — Z0000 Encounter for general adult medical examination without abnormal findings: Secondary | ICD-10-CM

## 2017-08-07 DIAGNOSIS — E559 Vitamin D deficiency, unspecified: Secondary | ICD-10-CM

## 2017-08-07 LAB — CBC W/ AUTO DIFF
BASOPHILS ABSOLUTE COUNT: 0 10*9/L (ref 0.0–0.1)
EOSINOPHILS ABSOLUTE COUNT: 0.1 10*9/L (ref 0.0–0.4)
HEMATOCRIT: 31.8 % — ABNORMAL LOW (ref 41.0–53.0)
HEMOGLOBIN: 10.1 g/dL — ABNORMAL LOW (ref 13.5–17.5)
LARGE UNSTAINED CELLS: 2 % (ref 0–4)
LYMPHOCYTES ABSOLUTE COUNT: 0.2 10*9/L — ABNORMAL LOW (ref 1.5–5.0)
MEAN CORPUSCULAR HEMOGLOBIN CONC: 31.9 g/dL (ref 31.0–37.0)
MEAN CORPUSCULAR HEMOGLOBIN: 27.6 pg (ref 26.0–34.0)
MEAN CORPUSCULAR VOLUME: 86.5 fL (ref 80.0–100.0)
MEAN PLATELET VOLUME: 7.7 fL (ref 7.0–10.0)
NEUTROPHILS ABSOLUTE COUNT: 4.8 10*9/L (ref 2.0–7.5)
PLATELET COUNT: 229 10*9/L (ref 150–440)
RED BLOOD CELL COUNT: 3.67 10*12/L — ABNORMAL LOW (ref 4.50–5.90)
RED CELL DISTRIBUTION WIDTH: 15.9 % — ABNORMAL HIGH (ref 12.0–15.0)

## 2017-08-07 LAB — AST (SGOT): Aspartate aminotransferase:CCnc:Pt:Ser/Plas:Qn:: 22

## 2017-08-07 LAB — URINALYSIS WITH CULTURE REFLEX
BACTERIA: NONE SEEN /HPF
GLUCOSE UA: 300 — AB
KETONES UA: NEGATIVE
NITRITE UA: NEGATIVE
PH UA: 5.5 (ref 5.0–9.0)
PROTEIN UA: 100 — AB
RBC UA: 3 /HPF — ABNORMAL HIGH (ref <3)
SQUAMOUS EPITHELIAL: <1 /HPF (ref 0–5)
UROBILINOGEN UA: 0.2
WBC UA: >182 /HPF — ABNORMAL HIGH (ref <2)

## 2017-08-07 LAB — COMPREHENSIVE METABOLIC PANEL
ALBUMIN: 3.8 g/dL (ref 3.5–5.0)
ALKALINE PHOSPHATASE: 68 U/L (ref 38–126)
ALT (SGPT): 39 U/L (ref 19–72)
ANION GAP: 8 mmol/L — ABNORMAL LOW (ref 9–15)
AST (SGOT): 22 U/L (ref 19–55)
BILIRUBIN TOTAL: 0.9 mg/dL (ref 0.0–1.2)
BLOOD UREA NITROGEN: 21 mg/dL (ref 7–21)
BUN / CREAT RATIO: 11
CALCIUM: 9.4 mg/dL (ref 8.5–10.2)
CO2: 26 mmol/L (ref 22.0–30.0)
CREATININE: 1.95 mg/dL — ABNORMAL HIGH (ref 0.70–1.30)
EGFR MDRD AF AMER: 48 mL/min/{1.73_m2} — ABNORMAL LOW (ref >=60)
EGFR MDRD NON AF AMER: 39 mL/min/{1.73_m2} — ABNORMAL LOW (ref >=60)
GLUCOSE RANDOM: 207 mg/dL — ABNORMAL HIGH (ref 65–99)
POTASSIUM: 4.4 mmol/L (ref 3.5–5.0)
PROTEIN TOTAL: 6 g/dL — ABNORMAL LOW (ref 6.5–8.3)

## 2017-08-07 LAB — URINALYSIS
BACTERIA: NONE SEEN /HPF
BILIRUBIN UA: NEGATIVE
GLUCOSE UA: 300 — AB
KETONES UA: NEGATIVE
NITRITE UA: NEGATIVE
PROTEIN UA: 100 — AB
RBC UA: 5 /HPF — ABNORMAL HIGH (ref <3)
SPECIFIC GRAVITY UA: 1.01 (ref 1.003–1.030)
UROBILINOGEN UA: 0.2
WBC UA: >182 /HPF — ABNORMAL HIGH (ref <2)

## 2017-08-07 LAB — PHOSPHORUS
PHOSPHORUS: 3.1 mg/dL (ref 2.9–4.7)
Phosphate:MCnc:Pt:Ser/Plas:Qn:: 3.1

## 2017-08-07 LAB — TOXICOLOGY SCREEN, URINE
AMPHETAMINE SCREEN URINE: <500
BARBITURATE SCREEN URINE: <200
COCAINE(METAB.)SCREEN, URINE: <150
METHADONE SCREEN, URINE: <300

## 2017-08-07 LAB — PROTEIN / CREATININE RATIO, URINE
CREATININE, URINE: 102.3 mg/dL
PROTEIN/CREAT RATIO, URINE: 1.183

## 2017-08-07 LAB — VITAMIN D, TOTAL (25OH): Lab: 24

## 2017-08-07 LAB — TACROLIMUS, TROUGH: Lab: 3.9

## 2017-08-07 LAB — MAGNESIUM: Magnesium:MCnc:Pt:Ser/Plas:Qn:: 1.7

## 2017-08-07 LAB — CALCIUM: Calcium:MCnc:Pt:Ser/Plas:Qn:: 9.4

## 2017-08-07 LAB — ESTIMATED AVERAGE GLUCOSE: Estimated average glucose:MCnc:Pt:Bld:Qn:Estimated from glycated hemoglobin: 206

## 2017-08-07 LAB — LARGE UNSTAINED CELLS: Lab: 2

## 2017-08-07 LAB — PROTEIN/CREAT RATIO, URINE: Protein/Creatinine:MRto:Pt:Urine:Qn:: 1.183

## 2017-08-07 LAB — SQUAMOUS EPITHELIAL
Lab: <1
Lab: <1

## 2017-08-07 LAB — BARBITURATE SCREEN URINE: Lab: <200

## 2017-08-07 NOTE — Unmapped (Signed)
Pt. Showed up in clinic this morning without an appointment. He had labs drawn this morning. He told me he threw up 3 times last night and had a fever but did not take his temperature. He said he thinks he may have a UTI. He c/o urinary frequency and burning with urination. A UA and UC will be obtained. I recommended to the pt. That if he continues to feel sick he needs to go to the ED. Primary coordinator will be notified.

## 2017-08-07 NOTE — Unmapped (Signed)
Urine collected and sent to lab.

## 2017-08-08 DIAGNOSIS — R197 Diarrhea, unspecified: Principal | ICD-10-CM

## 2017-08-08 LAB — CMV DNA, QUANTITATIVE, PCR: CMV VIRAL LD: NOT DETECTED

## 2017-08-08 LAB — BASIC METABOLIC PANEL
ANION GAP: 10 mmol/L (ref 9–15)
BLOOD UREA NITROGEN: 22 mg/dL — ABNORMAL HIGH (ref 7–21)
BUN / CREAT RATIO: 11
CALCIUM: 8.7 mg/dL (ref 8.5–10.2)
CHLORIDE: 104 mmol/L (ref 98–107)
CREATININE: 2.08 mg/dL — ABNORMAL HIGH (ref 0.70–1.30)
EGFR MDRD AF AMER: 44 mL/min/{1.73_m2} — ABNORMAL LOW (ref >=60)
EGFR MDRD NON AF AMER: 37 mL/min/{1.73_m2} — ABNORMAL LOW (ref >=60)
GLUCOSE RANDOM: 217 mg/dL — ABNORMAL HIGH (ref 65–99)
POTASSIUM: 3.7 mmol/L (ref 3.5–5.0)
SODIUM: 135 mmol/L (ref 135–145)

## 2017-08-08 LAB — CBC
HEMATOCRIT: 31 % — ABNORMAL LOW (ref 41.0–53.0)
HEMOGLOBIN: 10 g/dL — ABNORMAL LOW (ref 13.5–17.5)
MEAN CORPUSCULAR HEMOGLOBIN CONC: 32.2 g/dL (ref 31.0–37.0)
MEAN CORPUSCULAR HEMOGLOBIN: 27.5 pg (ref 26.0–34.0)
MEAN CORPUSCULAR VOLUME: 85.6 fL (ref 80.0–100.0)
PLATELET COUNT: 198 10*9/L (ref 150–440)
RED BLOOD CELL COUNT: 3.63 10*12/L — ABNORMAL LOW (ref 4.50–5.90)
RED CELL DISTRIBUTION WIDTH: 16 % — ABNORMAL HIGH (ref 12.0–15.0)

## 2017-08-08 LAB — MAGNESIUM: Magnesium:MCnc:Pt:Ser/Plas:Qn:: 1.6

## 2017-08-08 LAB — TACROLIMUS BLOOD: Lab: 4.4

## 2017-08-08 LAB — CO2: Carbon dioxide:SCnc:Pt:Ser/Plas:Qn:: 21 — ABNORMAL LOW

## 2017-08-08 LAB — HEMOGLOBIN: Lab: 10 — ABNORMAL LOW

## 2017-08-08 LAB — CMV QUANT LOG10: Lab: 0

## 2017-08-08 MED ORDER — INSULIN LISPRO (U-100) 100 UNIT/ML SUBCUTANEOUS SOLUTION
Freq: Three times a day (TID) | SUBCUTANEOUS | 0 refills | 0.00000 days | Status: SS
Start: 2017-08-08 — End: 2018-01-18

## 2017-08-08 MED ORDER — CELLCEPT 500 MG TABLET
ORAL_TABLET | Freq: Two times a day (BID) | ORAL | 0 refills | 0.00000 days | Status: CP
Start: 2017-08-08 — End: 2017-08-11

## 2017-08-08 MED ORDER — INSULIN GLARGINE (U-100) 100 UNIT/ML SUBCUTANEOUS SOLUTION
Freq: Every evening | SUBCUTANEOUS | 0 refills | 0.00000 days | Status: SS
Start: 2017-08-08 — End: 2018-01-18

## 2017-08-08 MED ORDER — AMOXICILLIN 500 MG CAPSULE
ORAL_CAPSULE | Freq: Two times a day (BID) | ORAL | 0 refills | 0.00000 days | Status: CP
Start: 2017-08-08 — End: 2017-08-18

## 2017-08-08 NOTE — Unmapped (Signed)
Pharmacy paged at this time to verify inpatient med orders

## 2017-08-08 NOTE — Unmapped (Signed)
Pt resting in bed at this time, NAD. Resp equal and unlabored at this time. Bed in low, locked position and call bell within reach. Pt denies any questions or concerns prior to transport. Transport paged at this time.

## 2017-08-08 NOTE — Unmapped (Signed)
Patient rounding completed. Bed in lowest and locked position with call bell in reach. Pt has no further complaints at this time.

## 2017-08-08 NOTE — Unmapped (Signed)
[facility] Emergency Department Provider Note      Time seen: August 07, 2017 8:51 PM    I have reviewed the triage vital signs and the nursing notes.  TRAVEL HISTORY  N/a/    History     Chief Complaint      HPI   Joshua Merritt is a 36 y.o. 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 Last night, patient had onset of diarrhea, nausea and vomiting. He attributes these symptoms to eating a sandwich 2 hours prior to the onset of diarrhea. He also has had dysuria, increased frequency, and urgency but denies any hematuria. These symptoms are similar to when he has had UTIs in the past. Patient presented to his PCP today without an appointment for evaluation of these symptoms. He had labs drawn; his UA showed large leukocyte esterase, >182 WBC, and 5 RBC.  He denies hematuria or abdominal pain. He has had decreased PO intake and feels both weak and dehydrated.    LOCATION  nausea, vomiting, diarrhea, dysuria,   DURATION 1 day,   CONTEXT Kidney transplant  ASSOCIATEDSYMPTOMS (pertinent positives)... increased urinary frequency, increased urgency,   ASSOCIATEDSYMPTOMS (pertinent negative)...abdominal pain, hematuria      Past Medical History:   Diagnosis Date   ??? CVA (cerebral infarction) 2006    Cerebrovasular disease with watershed distribution right frontal/occipital CVA   ??? Diabetes mellitus (CMS-HCC)    ??? Hyperlipidemia    ??? Hypertension    ??? Kidney failure    ??? Snoring    ??? TIA (transient ischemic attack)        Past Surgical History:   Procedure Laterality Date   ??? NEPHRECTOMY TRANSPLANTED ORGAN Right 2008   ??? URETERAL STENT PLACEMENT         No current facility-administered medications for this encounter.     Current Outpatient Prescriptions:   ???  acetaminophen (TYLENOL) 325 MG tablet, Take 2 tablets (650 mg total) by mouth every six (6) hours as needed., Disp: 60 tablet, Rfl: 0  ???  amLODIPine (NORVASC) 10 MG tablet, Take 1 tablet (10 mg total) by mouth daily., Disp: 90 tablet, Rfl: 3  ???  aspirin (ADULT LOW DOSE ASPIRIN) 81 MG tablet, Take 1 tablet (81 mg total) by mouth daily., Disp: 30 tablet, Rfl: 3  ???  atorvastatin (LIPITOR) 10 MG tablet, Take 1 tablet (10 mg total) by mouth nightly., Disp: 30 tablet, Rfl: 11  ???  carvedilol (COREG) 25 MG tablet, TAKE 1 TABLET BY MOUTH TWICE DAILY, Disp: 60 tablet, Rfl: 11  ???  CELLCEPT 500 mg tablet, Take 2 tablets (1,000 mg total) by mouth every twelve (12) hours. Z94.0, Disp: 360 tablet, Rfl: 3  ???  citalopram (CELEXA) 40 MG tablet, Take 1 tablet (40 mg total) by mouth daily., Disp: 30 tablet, Rfl: 11  ???  cloNIDine HCl (CATAPRES) 0.2 MG tablet, TAKE 1 TABLET BY MOUTH TWICE DAILY, Disp: 60 tablet, Rfl: 11  ???  ergocalciferol (VITAMIN D2) 50,000 unit capsule, Take 1 capsule (50,000 Units total) by mouth once a week., Disp: 12 capsule, Rfl: 3  ???  furosemide (LASIX) 40 MG tablet, Pt advised to take 40 mg daily and 80 mg every other day- PRN, Disp: 30 tablet, Rfl: 11  ???  insulin glargine (LANTUS) 100 unit/mL injection, Inject 0.3 mL (30 Units total) under the skin nightly., Disp:  9 mL, Rfl: 0  ???  magnesium oxide (MAGOX) 400 mg tablet, Take 1 tablet (400 mg total) by mouth daily., Disp: 200 tablet, Rfl: 1  ???  pen needle, diabetic (BD ULTRA-FINE NANO PEN NEEDLES) 32 gauge x 5/32 Ndle, 1 each by Miscellaneous route Four (4) times a day., Disp: 100 each, Rfl: 3  ???  pioglitazone (ACTOS) 30 MG tablet, Take 30 mg by mouth daily., Disp: , Rfl:   ???  predniSONE (DELTASONE) 5 MG tablet, Take one tablet daily., Disp: 90 tablet, Rfl: 3  ???  sodium bicarbonate 650 mg tablet, Take 2 tablets (1,300 mg total) by mouth Two (2) times a day., Disp: 120 tablet, Rfl: 11  ???  sulfamethoxazole-trimethoprim (BACTRIM,SEPTRA) 400-80 mg per tablet, Take 1 tablet (80 mg of trimethoprim total) by mouth 3 (three) times a week., Disp: 30 tablet, Rfl: 3  ???  tacrolimus (PROGRAF) 0.5 MG capsule, Z94.0 kidney transplant date 02/03/17; Take 1.5mg  am and 0.5mg  PM., Disp: 360 capsule, Rfl: 3  ???  valGANciclovir (VALCYTE) 450 mg tablet, Take 1 tablet (450 mg total) by mouth Two (2) times a day., Disp: 60 tablet, Rfl: 11    Allergies  Thymoglobulin [anti-thymocyte glob (rabbit)]    Family History   Problem Relation Age of Onset   ??? Lung cancer Maternal Grandfather    ??? Parkinsonism Paternal Grandfather        Social History  Social History   Substance Use Topics   ??? Smoking status: Former Smoker     Packs/day: 0.50     Years: 10.00     Types: Cigarettes     Quit date: 03/05/2017   ??? Smokeless tobacco: Never Used      Comment: 1 cigarette daily   ??? Alcohol use No      Comment: History of heavy alcohol use (2-3 40oz beers daily). Stopped on Jan. 3rd 2017.        Review of Systems    Constitutional: Positive for fevers, chills. Feels dehydrated  Eyes: no vision changes  ENT: no sore throat, no rhinorrhea  Cardiovascular: no chest pain, no palpitations, no lower extremity edema  Respiratory: no SOB, no cough, no difficulty breathing, + h/o OSA w/ CPAP  Gastrointestinal: Positive for nausea, vomiting, diarrhea, no constipation, no hematochezia, no melena  Genitourinary: Positive for dysuria, increased urinary frequency. Some discomfort at rt iliac fossa re tx kidney  iMusculoskeletal: no joint pain  Skin: no rash  Neurological: alert oriented, no weakness, no numbness/tingling  Allergic/Immunilogical: denied current drug use (prior cocaine and marijuana)     10-point review of systems is otherwise negative.    Physical Exam     VITAL SIGNS:    ED Triage Vitals [08/07/17 2009]   Enc Vitals Group      BP 133/80      Heart Rate 94      SpO2 Pulse       Resp 16      Temp 37.2 ??C (99 ??F)      Temp Source Oral      SpO2 98 %      Weight (!) 102.1 kg (225 lb)      Height 1.803 m (5' 11)       Constitutional: NAD, non-toxic appearing  Eyes:   ENT: no pharyngeal erythema, MMM  Cardiovascular: RRR, normal s1/s2, no murmurs/rubs/gallops, normal peripheral perfusion  Respiratory: CTAB, no rhonchi/wheezes/rhales  Gastrointestinal: obese abdomen, + mild abdominal tenderness at rt iliac fossa re tx kidney  Musculoskeletal: no deformity  Neurologic: no focal defects  Skin: no rashes, no lesions      Initial Impression, ED Course, Assessment and Plan     Patient presented to ED with fever/chills, UTI, and n/v/d since yesterday causing dehydration in setting of immunocompromised status 2/2 renal transplant (2008) complicated by chronic rejection.     Patient afebrile in ED after taking tylenol prior to arrival. Patient appears well but consulting nephrology for admission due to presence of infection with immunocompromised status. Blood cultures, urine cultures obtained. Prior UTI 12/2015 + enterobacter, previously treated with Cipro 500.     Started Cipro 400mg  IV x 1 dose. Consulted nephrology fellow and resident for admission and recs.     Patient staffed with Dr. Danella Sensing at shift change.     Pertinent labs & imaging results that were available during my care of the patient were reviewed by me and considered in my medical decision making (see chart for details).        ED Clinical Impression     Final diagnoses:   Dysuria (Primary)   Diarrhea, unspecified type   Bacteriuria   Immunosuppressed status (CMS-HCC)   pyelonephritis  Renal transplant     Documentation assistance was provided by Daine Gravel, Scribe, on August 07, 2017 at 8:51 PM for Raeanne Barry, MD.I reviewed scribe and studentnotes and made adjustments as appropriate.           Oletha Cruel, MD  08/09/17 1248

## 2017-08-08 NOTE — Unmapped (Signed)
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 *      Post Discharge Follow Up Issues:   -Follow up with transplant nephrology as scheduled  - May need to consider further workup of recurrent UTIs if not already completed    Hospital Course:   Patient was admitted and started on ceftriaxone. He responded very well to one dose, and felt well enough to go home. Given his history of strep mitis urine cultures, he was discharged on amoxicillin for 10 days with instructions to call his transplant coordinator if he had fevers, chills, or overall felt worse.     Procedures:  No admission procedures for hospital encounter.  ______________________________________________________________________    Discharge Day Services:  BP 155/98  - Pulse 75  - Temp 35.6 ??C (Oral)  - Resp 18  - Ht 180.3 cm (5' 10.98)  - Wt (!) 150.2 kg (331 lb 3.2 oz)  - SpO2 94%  - BMI 46.21 kg/m??   Pt seen on the day of discharge and determined appropriate for discharge.    Condition at Discharge: good  ______________________________________________________________________  Discharge Medications:     Your Medication List      START taking these medications    amoxicillin 500 MG capsule  Commonly known as:  AMOXIL  Take 1 capsule (500 mg total) by mouth Two (2) times a day. for 10 days     insulin lispro 100 unit/mL injection  Commonly known as:  HumaLOG  Inject 0.03 mL (3 Units total) under the skin Three (3) times a day with a meal. CONTINUE taking these medications    acetaminophen 325 MG tablet  Commonly known as:  TYLENOL  Take 2 tablets (650 mg total) by mouth every six (6) hours as needed.     amLODIPine 10 MG tablet  Commonly known as:  NORVASC  Take 1 tablet (10 mg total) by mouth daily.     aspirin 81 MG tablet  Commonly known as:  ADULT LOW DOSE ASPIRIN  Take 1 tablet (81 mg total) by mouth daily.     atorvastatin 10 MG tablet  Commonly known as:  LIPITOR  Take 1 tablet (10 mg total) by mouth nightly.     carvedilol 25 MG tablet  Commonly known as:  COREG  TAKE 1 TABLET BY MOUTH TWICE DAILY     CELLCEPT 500 mg tablet  Generic drug:  mycophenolate  Take 2 tablets (1,000 mg total) by mouth every twelve (12) hours. Z94.0     citalopram 40 MG tablet  Commonly known as:  CeleXA  Take 1 tablet (40 mg total) by mouth daily.     cloNIDine HCl 0.2 MG tablet  Commonly known as:  CATAPRES  TAKE 1 TABLET BY MOUTH TWICE DAILY     ergocalciferol 50,000 unit capsule  Commonly known as:  VITAMIN D2  Take 1 capsule (50,000 Units total) by mouth once a  week.     furosemide 40 MG tablet  Commonly known as:  LASIX  Pt advised to take 40 mg daily and 80 mg every other day- PRN     glimepiride 4 MG tablet  Commonly known as:  AMARYL  Take 4 mg by mouth every morning before breakfast.     insulin glargine 100 unit/mL injection  Commonly known as:  LANTUS  Inject 0.3 mL (30 Units total) under the skin nightly.     magnesium oxide 400 mg tablet  Commonly known as:  MAGOX  Take 1 tablet (400 mg total) by mouth daily.     pen needle, diabetic 32 gauge x 5/32 Ndle  Commonly known as:  BD ULTRA-FINE NANO PEN NEEDLE  1 each by Miscellaneous route Four (4) times a day.     pioglitazone 30 MG tablet  Commonly known as:  ACTOS  Take 30 mg by mouth daily.     predniSONE 5 MG tablet  Commonly known as:  DELTASONE  Take one tablet daily.     sodium bicarbonate 650 mg tablet  Take 2 tablets (1,300 mg total) by mouth Two (2) times a day. sulfamethoxazole-trimethoprim 400-80 mg per tablet  Commonly known as:  BACTRIM,SEPTRA  Take 1 tablet (80 mg of trimethoprim total) by mouth 3 (three) times a week.     tacrolimus 0.5 MG capsule  Commonly known as:  PROGRAF  Z94.0 kidney transplant date 02/03/17; Take 1.5mg  am and 0.5mg  PM.     valGANciclovir 450 mg tablet  Commonly known as:  VALCYTE  Take 1 tablet (450 mg total) by mouth Two (2) times a day.          ______________________________________________________________________  Pending Test Results (if blank, then none):   Order Current Status    Blood Culture #1 In process    Blood Culture #2 In process    DARK GREEN EXTRA TUBE In process    ED Extra Tubes In process    Urine Culture In process          Pertinent Recent Labs:  Lab Results   Component Value Date    WBC 5.2 08/08/2017    HGB 10.0 (L) 08/08/2017    HCT 31.0 (L) 08/08/2017    PLT 198 08/08/2017       Lab Results   Component Value Date    NA 135 08/08/2017    K 3.7 08/08/2017    CL 104 08/08/2017    CO2 21.0 (L) 08/08/2017    BUN 22 (H) 08/08/2017    CREATININE 2.08 (H) 08/08/2017    GLU 217 (H) 08/08/2017    CALCIUM 8.7 08/08/2017    MG 1.6 08/08/2017    PHOS 3.1 08/07/2017       Lab Results   Component Value Date    BILITOT 0.9 08/07/2017    BILIDIR 0.30 01/03/2016    PROT 6.0 (L) 08/07/2017    ALBUMIN 3.8 08/07/2017    ALT 39 08/07/2017    AST 22 08/07/2017    ALKPHOS 68 08/07/2017    GGT 30 01/04/2015       Lab Results   Component Value Date    LABPROT 9.3 (L) 11/16/2014    INR 1.03 05/07/2017    APTT 25.2 05/07/2017         Pertinent Hospital Radiology:  No results found.    ______________________________________________________________________    Discharge Instructions:     You were admitted to [facility] with a urinary tract  infection. You responded very well to the initial dose of IV antibiotics and were able to be discharged before the cultures grew. You will take amoxicillin 500mg  twice daily for 10 days.     If your blood cultures start to grow bacteria, we will call you and you may need to come back to the hospital for IV antibiotics.     Please follow up with your transplant doctor as scheduled to ensure that your symptoms have resolved.           Appointments which have been scheduled for you    Aug 27, 2017 10:20 AM EDT  RETURN NEPHROLOGY POST with Posey Boyer True, MD  [facility] KIDNEY TRANSPLANT ACC [address] [place] [facility]) [address]  [place] [address]  [phone]          Length of Discharge: I spent less than 30 mins in the discharge of this patient.    Lindaann Pascal, MD  PGY-1  [facility]  Pager [phone]

## 2017-08-08 NOTE — Unmapped (Signed)
Patient rounding complete, call bell in reach, bed locked and in lowest position, patient belongings at bedside and within reach of patient.  Patient updated on plan of care.

## 2017-08-08 NOTE — Unmapped (Signed)
Patient kidney transplant in 2008, complaining of fever chills, N/V/D was at PCP today and told to come to ed

## 2017-08-08 NOTE — Unmapped (Signed)
Problem: Patient Care Overview  Goal: Plan of Care Review  Outcome: Progressing  Patient is alert and oriented x 4, self care. Vss, afebrile, no s/s of infection. No c/o pain. Patient was oriented to the unit. Admission completed. Call bell within reach. He is resting in bed. no needs. Will continue with the plan of care.   Goal: Individualization and Mutuality  Outcome: Progressing    Goal: Discharge Needs Assessment  Outcome: Progressing    Goal: Interprofessional Rounds/Family Conf  Outcome: Progressing      Problem: Infection, Risk/Actual (Adult)  Goal: Identify Related Risk Factors and Signs and Symptoms  Related risk factors and signs and symptoms are identified upon initiation of Human Response Clinical Practice Guideline (CPG).  Outcome: Progressing    Goal: Infection Prevention/Resolution  Patient will demonstrate the desired outcomes by discharge/transition of care.  Outcome: Progressing      Problem: Pain, Acute (Adult)  Goal: Identify Related Risk Factors and Signs and Symptoms  Related risk factors and signs and symptoms are identified upon initiation of Human Response Clinical Practice Guideline (CPG).  Outcome: Progressing    Goal: Acceptable Pain Control/Comfort Level  Patient will demonstrate the desired outcomes by discharge/transition of care.  Outcome: Progressing

## 2017-08-08 NOTE — Unmapped (Signed)
Pt resting in bed at this time, NAD. Resp equal and unlabored. Pt denies any needs or complaints at this time. Bed in low, locked position and call bell within reach. Belongings at bedside.

## 2017-08-08 NOTE — Unmapped (Signed)
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 *      Joshua Merritt is a 36 y.o. 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 Reg diet  Prophylaxis. Hep subq  Access. piv  Code Status. full  Dispo. Admit mdb floor  ___________________________________________________________________    Chief Complaint:  Chief Complaint   Patient presents with   ??? Abdominal Pain   ??? Fever Between 9 Weeks and 35 Years Old   ??? Nausea       HPI:  Joshua Merritt is a 36 y.o. 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 Reports eating dinner last night didn't sit well, vomited afterwards and developed burning with urination last night. Woke up several times with frequency and reports these sx are c/w his typical urinary infx. Subjective fevers at home for which took tylenol and has some abd discomfort lingering, contacted his txplant coordinator who recommended presentation to ED. Of note, he's grown strep sp multiple times over the years as well as enterobacter, both broadly sensitive. No SOB, cp, weakness. Now tolerating po. Has maintained compliance with all meds.    ED course- HDS and afebrile (gave himself tylenol). Labs generally unremarkable. Urine grossly dirty tho w/o bacteria. Given rocephin x1 after cultures. Admit to medicine    Allergies:  Thymoglobulin [anti-thymocyte glob (rabbit)]    Medications:   Prior to Admission medications    Medication Dose, Route, Frequency   glimepiride (AMARYL) 4 MG tablet 4 mg, Oral, Daily   acetaminophen (TYLENOL) 325 MG tablet 650 mg, Oral, Every 6 hours PRN   amLODIPine (NORVASC) 10 MG tablet 10 mg, Oral, Daily (standard)   aspirin (ADULT LOW DOSE ASPIRIN) 81 MG tablet 81 mg, Oral, Daily (standard)   atorvastatin (LIPITOR) 10 MG tablet 10 mg, Oral, Nightly   carvedilol (COREG) 25 MG tablet TAKE 1 TABLET BY MOUTH TWICE  DAILY   CELLCEPT 500 mg tablet 1,000 mg, Oral, Every 12 hours scheduled, Z94.0   citalopram (CELEXA) 40 MG tablet 40 mg, Oral, Daily (standard)   cloNIDine HCl (CATAPRES) 0.2 MG tablet TAKE 1 TABLET BY MOUTH TWICE DAILY   ergocalciferol (VITAMIN D2) 50,000 unit capsule 50,000 Units, Oral, Weekly   furosemide (LASIX) 40 MG tablet Pt advised to take 40 mg daily and 80 mg every other day- PRN   insulin glargine (LANTUS) 100 unit/mL injection 30 Units, Subcutaneous, Nightly   magnesium oxide (MAGOX) 400 mg tablet 400 mg, Oral, Daily (standard)   pen needle, diabetic (BD ULTRA-FINE NANO PEN NEEDLES) 32 gauge x 5/32 Ndle 1 each, Miscellaneous, 4 times a day   pioglitazone (ACTOS) 30 MG tablet 30 mg, Oral, Daily (standard)   predniSONE (DELTASONE) 5 MG tablet Take one tablet daily.   sodium bicarbonate 650 mg tablet 1,300 mg, Oral, 2 times a day (standard)   sulfamethoxazole-trimethoprim (BACTRIM,SEPTRA) 400-80 mg per tablet 1 tablet, Oral, 3 times weekly   tacrolimus (PROGRAF) 0.5 MG capsule Z94.0 kidney transplant date 02/03/17; Take 1.5mg  am and 0.5mg  PM.   valGANciclovir (VALCYTE) 450 mg tablet 450 mg, Oral, 2 times a day (standard)       Medical History:  Past Medical History:   Diagnosis Date   ??? CVA (cerebral infarction) 2006    Cerebrovasular disease with watershed distribution right frontal/occipital CVA   ??? Diabetes mellitus (CMS-HCC)    ??? Hyperlipidemia    ??? Hypertension    ??? Kidney failure    ??? Snoring    ??? TIA (transient ischemic attack)        Surgical History:  Past Surgical History:   Procedure Laterality Date   ??? NEPHRECTOMY TRANSPLANTED ORGAN Right 2008   ??? URETERAL STENT PLACEMENT         Social History:  Social History     Social History   ??? Marital status: Single     Spouse name: N/A   ??? Number of children: N/A   ??? Years of education: N/A     Occupational History   ??? Not on file.     Social History Main Topics   ??? Smoking status: Former Smoker     Packs/day: 0.50     Years: 10.00     Types: Cigarettes     Quit date: 03/05/2017   ??? Smokeless tobacco: Never Used      Comment: 1 cigarette daily   ??? Alcohol use No      Comment: History of heavy alcohol use (2-3 40oz beers daily). Stopped on Jan. 3rd 2017.    ??? Drug use: Yes     Types: Cocaine, Marijuana      Comment: last use of cocaine was 12/31/2015   ??? Sexual activity: Not on file     Other Topics Concern   ??? Not on file     Social History Narrative    [facility] Previously lived with his girlfriend and 4 kids.    Address [place], [place], [place], [place], [place]    Guardian/Payee: None        Family Contact:      Outpatient Providers:  ,      Relationship Status: Single     Children: Yes; live with girlfriend    Education: Scientist, product/process development college    Income/Employment/Disability: Stage manager Service: No    Abuse/Neglect/Trauma: none. Informant: the patient  Domestic Violence: No. Informant: the patient     Exposure/Witness to Violence: None    Protective Services Involvement: None    Current/Prior Legal: None    Physical Aggression/Violence: None      Access to Firearms: None     Gang Involvement: None           Family History:  Family History   Problem Relation Age of Onset   ??? Lung cancer Maternal Grandfather    ??? Parkinsonism Paternal Grandfather        Review of Systems:  10 systems reviewed and are negative unless otherwise mentioned in HPI    Labs/Studies:  Labs and Studies from the last 24hrs per EMR and Reviewed    Physical Exam:  Temp:  [36.9 ??C-37.2 ??C] 36.9 ??C  Heart Rate:  [84-94] 84  Resp:  [16-18] 18  BP: (133-144)/(63-80) 144/63  SpO2:  [98 %] 98 %    General: chronically ill but pleasant adult male, NAD  HEENT: EOMI, MMM  Neck: supple, no LAD  CV: RRR, no murmur  Pulm: CTAB, nl wob, speaks full sentences  Back: mild L cva tenderness  Abd: soft, NT/ND, NABS, RTx palpable and mildly tender  Ext: 2+ pulses, no peripheral edema  Neuro: aaox3, nonfocal  Skin: no lesions or rashes

## 2017-08-08 NOTE — Unmapped (Signed)
Report given to 8BT RN at this time.

## 2017-08-08 NOTE — Unmapped (Signed)
Pt resting in bed at this time, NAD. Resp equal and unlabored. Pt is AOx4. Pt informed of need for urine sample. Pt verbalized understanding at this time. Specimen cup at bedside. Bed in low, locked position and call bell within reach.

## 2017-08-08 NOTE — Unmapped (Deleted)
Daily Progress Note    Interval History:   No acute events overnight. Patient is feeling much improved this morning.     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 *      Joshua Merritt is a 36 y.o. male with a history of T2DM s/p renal transplant in 2008 c/b cellular mediated rejection who presented with UTI.     UTI in immunocompromised host  - Urine and blood cultures pending  - Continue ceftriaxone (8/9 - )  - Renal transplant ultrasound results pending     Renal transplant 2008 c/b cellular mediated rejection  - Creatinine currently around baseline   - Holding cellcept given acute infection  - Continue tacrolimus and prednisone  - Continue bactrim and valgancyclovir ppx  - Continue PO bicarb    T2DM  - holding home oral agents  - Decreased lantus and achs short acting insulin by 50% (currently 15u lantus and 3u achs)  - SSI    HTN  - Decreased coreg and norvac to 50% home dose  - Continue clonidine      FEN/GI. Reg diet  Prophylaxis. Hep subq  Access. piv  Code Status. full  Dispo. Admit mdb floor  ___________________________________________________________________    Labs/Studies:  Labs and Studies from the last 24hrs per EMR and Reviewed    Objective:  Temp:  [35.6 ??C-37.5 ??C] 35.6 ??C  Heart Rate:  [74-94] 75  Resp:  [15-18] 18  BP: (133-155)/(63-98) 155/98  SpO2:  [94 %-100 %] 94 %,   Intake/Output Summary (Last 24 hours) at 08/08/17 1331  Last data filed at 08/08/17 1100   Gross per 24 hour   Intake             1100 ml   Output              300 ml   Net              800 ml       Physical Exam:  GEN: sitting up in bed, no acute distress  CARD: RRR no m/r/g  PULM: CTAB  ABD: soft, nontender, no pain at transplant site  EXT: no edema or cyanosis  NEURO: alert and oriented x3    Lindaann Pascal, MD  PGY-1  [facility]  Pager [phone]

## 2017-08-08 NOTE — Unmapped (Signed)
Care Management  Initial Transition Planning Assessment              General  Care Manager assessed the patient by : In person interview with patient  Orientation Level: Oriented X4  Who provides care at home?: N/A    Contact/Decision Maker:    Contact Details  Contact Details: Primary Contact  Primary Contact Name: Benny Lennert  Primary Contact Relationship: Significant Other  Phone #1: [phone]  Secondary Contact Name: Estanislado Emms  Secondary Contact Relationship: Father  Phone #3: [phone]    Advance Directive (Medical Treatment)  Does patient have an advance directive covering medical treatment?: Patient would not like information.  Reason patient does not have an advance directive covering medical treatment:: Patient does not wish to complete one at this time  Surrogate decision maker appointed:: [facility] only) Other authorized decision-maker (Comment on relationship to patient)  Surrogate decision maker's name:: Print production planner maker's phone number:: [phone]  Information provided on advance directive:: Yes  Patient requests assistance:: No         Patient Information:    Lives with: Spouse/significant other, Children (SO Heather and their 4 children ages 66, 52, 72, 3yo)    Type of Residence: Private residence        Location/Detail: [address], [place], [place] [address]    Support Systems: Family Members, Children, Significant Other    Responsibilities/Dependents at home?: No    Home Care services in place prior to admission?: No                  Equipment Currently Used at Home: none       Currently receiving outpatient dialysis?: N/A       Financial Information:     Patient source of income: SSDI    Need for financial assistance?: No       Discharge Needs Assessment:    Concerns to be Addressed: care coordination/care conferences, discharge planning    Clinical Risk Factors: Multiple Diagnoses (Chronic)    Barriers to taking medications: No    Prior overnight hospital stay or ED visit in last 90 days: No    Readmission Within the Last 30 Days: no previous admission in last 30 days         Anticipated Changes Related to Illness: none    Equipment Needed After Discharge: none    Discharge Facility/Level of Care Needs:      Patient at risk for readmission?: Yes    Discharge Plan:    Screen findings are: Care Manager reviewed the plan of the patient's care with the Multidisciplinary Team. No discharge planning needs identified at this time. Care Manager will continue to manage plan and monitor patient's progress with the team.    Expected Discharge Date: 08/11/17    Expected Transfer from Critical Care:      Patient and/or family were provided with choice of facilities / services that are available and appropriate to meet post hospital care needs?: N/A       Initial Assessment complete?: Yes

## 2017-08-08 NOTE — Unmapped (Signed)
Called patient back after Urinalysis resulted. He sad he feels real bad and still has a fever. Advised to come to ER for further evaluation because he may need IV antibiotics.

## 2017-08-08 NOTE — Unmapped (Signed)
Admit team MD at bedside at this time. Pt ambulatory to [facility] w/o difficulty. Gait even and steady. This RN introduced self to pt, acclimated pt to [facility] area, chair low and locked. Provider to see pt. Pt within eyesight of RN.

## 2017-08-11 MED ORDER — CELLCEPT 500 MG TABLET
ORAL_TABLET | Freq: Two times a day (BID) | ORAL | 3 refills | 0 days | Status: CP
Start: 2017-08-11 — End: 2017-08-11

## 2017-08-11 MED ORDER — MYCOPHENOLATE MOFETIL 500 MG TABLET
ORAL_TABLET | Freq: Two times a day (BID) | ORAL | 3 refills | 0.00000 days | Status: CP
Start: 2017-08-11 — End: 2017-08-12

## 2017-08-12 LAB — BK VIRAL LOAD (QUANTITATIVE)

## 2017-08-12 LAB — BK BLOOD QUANT: Lab: 0

## 2017-08-12 MED ORDER — CELLCEPT 500 MG TABLET
ORAL_TABLET | Freq: Two times a day (BID) | ORAL | 3 refills | 0.00000 days | Status: CP
Start: 2017-08-12 — End: 2018-09-23

## 2017-08-12 MED ORDER — CELLCEPT 500 MG TABLET: tablet | 3 refills | 0 days

## 2017-08-27 ENCOUNTER — Ambulatory Visit
Admission: RE | Admit: 2017-08-27 | Discharge: 2017-08-27 | Disposition: A | Discharge status: Home / Self Care | Payer: MEDICARE

## 2017-08-27 ENCOUNTER — Ambulatory Visit
Admission: RE | Admit: 2017-08-27 | Discharge: 2017-08-27 | Disposition: A | Discharge status: Home / Self Care | Payer: MEDICARE | Attending: Nephrology | Admitting: Nephrology

## 2017-08-27 DIAGNOSIS — E559 Vitamin D deficiency, unspecified: Secondary | ICD-10-CM

## 2017-08-27 DIAGNOSIS — E119 Type 2 diabetes mellitus without complications: Secondary | ICD-10-CM

## 2017-08-27 DIAGNOSIS — D899 Disorder involving the immune mechanism, unspecified: Principal | ICD-10-CM

## 2017-08-27 DIAGNOSIS — Z940 Kidney transplant status: Secondary | ICD-10-CM

## 2017-08-27 LAB — TACROLIMUS, TROUGH: Lab: 5.7

## 2017-08-27 LAB — SLIDE REVIEW

## 2017-08-27 LAB — COMPREHENSIVE METABOLIC PANEL
ALBUMIN: 3.7 g/dL (ref 3.5–5.0)
ALKALINE PHOSPHATASE: 74 U/L (ref 38–126)
ALT (SGPT): 38 U/L (ref 19–72)
ANION GAP: 10 mmol/L (ref 9–15)
BILIRUBIN TOTAL: 0.5 mg/dL (ref 0.0–1.2)
BLOOD UREA NITROGEN: 19 mg/dL (ref 7–21)
BUN / CREAT RATIO: 11
CALCIUM: 9.4 mg/dL (ref 8.5–10.2)
CHLORIDE: 108 mmol/L — ABNORMAL HIGH (ref 98–107)
CREATININE: 1.8 mg/dL — ABNORMAL HIGH (ref 0.70–1.30)
EGFR MDRD AF AMER: 52 mL/min/{1.73_m2} — ABNORMAL LOW (ref >=60)
EGFR MDRD NON AF AMER: 43 mL/min/{1.73_m2} — ABNORMAL LOW (ref >=60)
GLUCOSE RANDOM: 135 mg/dL — ABNORMAL HIGH (ref 65–99)
PROTEIN TOTAL: 5.9 g/dL — ABNORMAL LOW (ref 6.5–8.3)
SODIUM: 141 mmol/L (ref 135–145)

## 2017-08-27 LAB — CBC W/ AUTO DIFF
BASOPHILS ABSOLUTE COUNT: 0 10*9/L (ref 0.0–0.1)
EOSINOPHILS ABSOLUTE COUNT: 0.1 10*9/L (ref 0.0–0.4)
HEMOGLOBIN: 10.5 g/dL — ABNORMAL LOW (ref 13.5–17.5)
LARGE UNSTAINED CELLS: 1 % (ref 0–4)
LYMPHOCYTES ABSOLUTE COUNT: 0.4 10*9/L — ABNORMAL LOW (ref 1.5–5.0)
MEAN CORPUSCULAR HEMOGLOBIN CONC: 31.5 g/dL (ref 31.0–37.0)
MEAN CORPUSCULAR HEMOGLOBIN: 27.7 pg (ref 26.0–34.0)
MEAN CORPUSCULAR VOLUME: 87.9 fL (ref 80.0–100.0)
MEAN PLATELET VOLUME: 8.5 fL (ref 7.0–10.0)
NEUTROPHILS ABSOLUTE COUNT: 3.1 10*9/L (ref 2.0–7.5)
PLATELET COUNT: 257 10*9/L (ref 150–440)
RED BLOOD CELL COUNT: 3.8 10*12/L — ABNORMAL LOW (ref 4.50–5.90)
RED CELL DISTRIBUTION WIDTH: 16.2 % — ABNORMAL HIGH (ref 12.0–15.0)
WBC ADJUSTED: 3.9 10*9/L — ABNORMAL LOW (ref 4.5–11.0)

## 2017-08-27 LAB — MAGNESIUM: Magnesium:MCnc:Pt:Ser/Plas:Qn:: 1.7

## 2017-08-27 LAB — ESTIMATED AVERAGE GLUCOSE: Estimated average glucose:MCnc:Pt:Bld:Qn:Estimated from glycated hemoglobin: 203

## 2017-08-27 LAB — PROTEIN / CREATININE RATIO, URINE: PROTEIN/CREAT RATIO, URINE: 1.565

## 2017-08-27 LAB — PHOSPHORUS: Phosphate:MCnc:Pt:Ser/Plas:Qn:: 3.1

## 2017-08-27 LAB — SMEAR REVIEW

## 2017-08-27 LAB — PARATHYROID HOMONE (PTH)
CALCIUM: 9.4 mg/dL (ref 8.5–10.2)
PARATHYROID HORMONE INTACT: 243 pg/mL — ABNORMAL HIGH (ref 12.0–72.0)

## 2017-08-27 LAB — NEUTROPHIL LEFT SHIFT

## 2017-08-27 LAB — AST (SGOT): Aspartate aminotransferase:CCnc:Pt:Ser/Plas:Qn:: 26

## 2017-08-27 LAB — PROTEIN/CREAT RATIO, URINE: Protein/Creatinine:MRto:Pt:Urine:Qn:: 1.565

## 2017-08-27 LAB — HEMOGLOBIN A1C: HEMOGLOBIN A1C: 8.7 % — ABNORMAL HIGH (ref 4.8–5.6)

## 2017-08-27 LAB — PARATHYROID HORMONE INTACT: Parathyrin.intact:MCnc:Pt:Ser/Plas:Qn:: 243 — ABNORMAL HIGH

## 2017-08-27 MED ORDER — MYCOPHENOLATE SODIUM 360 MG TABLET,DELAYED RELEASE
0 refills | 0 days
Start: 2017-08-27 — End: 2018-08-27

## 2017-08-27 MED ORDER — MYFORTIC 360 MG TABLET,DELAYED RELEASE
ORAL_TABLET | Freq: Two times a day (BID) | ORAL | 0 refills | 0 days | Status: CP
Start: 2017-08-27 — End: 2017-08-27

## 2017-08-27 MED FILL — MYFORTIC/360MG/TBEC: MYFORTIC/360MG/TBEC | 30 days supply | Qty: 120 | Fill #0

## 2017-08-27 NOTE — Unmapped (Signed)
Transplant Nephrology Clinic Visit      History of Present Illness    Patient is a 36 y.o. year old male who underwent living donor transplant on 02/03/2007 secondary to hypertension. His post transplant course has been complicated by ureteral stricture requiring reimplantation and multiple episodes of antibody and cellular mediated rejection. This is detailed in the clinic note from 12/09/2012. 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 For that, he received 3 doses of Solu-Medrol, 6 treatments of plasmapheresis with low-dose IVIG followed by 2 doses of high-dose IVIG and 2 doses of rituximab. Follow-up biopsy in November 2015 showed ongoing rejection and he received another 3 doses of pulse Solu-Medrol and 6 treatments of plasmapheresis with low-dose IVIG complicated by urosepsis.   ??  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 DSA screens since 02/08/2016 had been negative, most recently on 08/21/16. DSA screen on 06/02/2017 revealed a DSA's to DQ7 at MFI 7563 and DQA1*05.  ??  His most recent baseline creatinine has been between 2.0 to 2.5.  ??  He presented to the ED on 01/03/16 for detox. Apparently for the 6 months prior to presentation he had been drinking three 40 ounce beers daily, as well as using marijuana and cocaine daily. He was admitted and started on CIWA protocol. He was discharged on 1/6 with a Librium taper. He came back to the ED on 01/08/2016 complaining of visual hallucinations. He was seen by psychiatry who felt these hallucinations were likely secondary to cocaine and heavy alcohol use and not a primary psychotic illness. It was felt that inpatient treatment would benefit him and he was discharged on 1/12 to [facility]. He was readmitted on 1/15 when he was found to have an elevated creatinine to 2.67 and a potassium of 5.8 at the facility. He had some tenderness over his graft and was treated for an Enterobacter UTI with Cipro. His creatinine was 1.8 on discharge. He subsequently spent about 5-1/2 months at the [facility].   ??  He was admitted from 2/27 through 03/11/2017 after creatinine was noted to rise from 1.9 in January to 2.71 on 2/26. 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 He subsequently underwent 6 sessions of plasmapheresis, thymoglobulin for 7 days and received one dose of rituximab. Second dose of ritux given 04/02/2017.    He was admitted to [facility] from 5/9 through 05/10/2017 for concerns of AK I possibly related to abdominal trauma. 2 months prior to admission the patient had been playing with his 74-year-old son who jumped on his abdomen, and patient reported progressive pain that acutely worsen the week prior to admission. On admission to an outside hospital he was found to have an unremarkable noncontrasted CT but creatinine up to 3.7 from his baseline. On admission to [facility] as creatinine was down to 2.6. His renal ultrasound showed a patchy perfusion and trace caliectasis. His urine toxicology screen was positive for cocaine and cannabinoids and opiates. He underwent renal biopsy on 05/09/2017 which showed:  Chronic, inactive rejection with mild to moderate sclerosing transplant vasculopathy. Moderate to severe interstitial fibrosis and tubular atrophy. Severe focal stenosing arteriolosclerosis, suggestive of structural calcineurin inhibitor toxicity. His creatinine was 2.02 on discharge.    Interval history since last seen 07/04/2017    He was admitted from 08/07/17 to 08/08/17 for nausea, vomiting, diarrhea. Patient treated with ceftriaxone for presumed UTI. He felt better and did not want to stay to await the culture results. Due to history of strep mitis urine cultures, he was discharged on amoxicillin for 10 days. His urine culture ultimately grew mixed urogenital flora and his blood cultures were negative.    He has not had recurrence of symptoms since his discharge from the hospital. He has been using his CPAP more regularly and reports feeling better with that. Trying to increase his physical activity with goals of weight loss.    Review of Systems    Otherwise on review of systems patient denies fever or chills, chest pain, SOB, PND or orthopnea, lower extremity edema. Denies N/V/abdominal pain. No dysuria, hematuria or difficulty voiding. Bowel movements normal. Denies joint pain or rash. All other systems are reviewed and are negative.    Medications    Current Outpatient Prescriptions   Medication Sig Dispense Refill   ??? acetaminophen (TYLENOL) 325 MG tablet Take 2 tablets (650 mg total) by mouth every six (6) hours as needed. 60 tablet 0   ??? amLODIPine (NORVASC) 10 MG tablet Take 1 tablet (10 mg total) by mouth daily. 90 tablet 3   ??? aspirin (ADULT LOW DOSE ASPIRIN) 81 MG tablet Take 1 tablet (81 mg total) by mouth daily. 30 tablet 3   ??? atorvastatin (LIPITOR) 10 MG tablet Take 1 tablet (10 mg total) by mouth nightly. 30 tablet 11   ??? carvedilol (COREG) 25 MG tablet TAKE 1 TABLET BY MOUTH TWICE DAILY 60 tablet 11   ??? CELLCEPT 500 mg tablet Take 2 tablets (1,000 mg total) by mouth every twelve (12) hours. Z94.0; trans 360 tablet 3   ??? citalopram (CELEXA) 40 MG tablet Take 1 tablet (40 mg total) by mouth daily. 30 tablet 11   ??? cloNIDine HCl (CATAPRES) 0.2 MG tablet TAKE 1 TABLET BY MOUTH TWICE DAILY 60 tablet 11   ??? ergocalciferol (VITAMIN D2) 50,000 unit capsule Take 1 capsule (50,000 Units total) by mouth once a week. 12 capsule 3   ??? furosemide (LASIX) 40 MG tablet Pt advised to take 40 mg daily and 80 mg every other day- PRN 30 tablet 11   ??? glimepiride (AMARYL) 4 MG tablet Take 4 mg by mouth every morning before breakfast.     ??? insulin glargine (LANTUS) 100 unit/mL injection Inject 0.3 mL (30 Units total) under the skin nightly. 9 mL 0   ??? insulin lispro (HUMALOG) 100 unit/mL injection Inject 0.03 mL (3 Units total) under the skin Three (3) times a day with a meal. 3 mL 0   ??? magnesium oxide (MAGOX) 400 mg tablet Take 1 tablet (400 mg total) by mouth daily. 200 tablet 1   ??? pen needle, diabetic (BD ULTRA-FINE NANO PEN NEEDLES) 32 gauge x 5/32 Ndle 1 each by Miscellaneous route Four (4) times a day. 100 each 3   ??? sulfamethoxazole-trimethoprim (BACTRIM,SEPTRA) 400-80 mg per tablet Take 1 tablet (80 mg of trimethoprim total) by mouth 3 (three) times a week. 30 tablet 3   ??? tacrolimus (PROGRAF) 0.5 MG capsule Z94.0 kidney transplant date 02/03/17; Take 1.5mg  am and 0.5mg   PM. 360 capsule 3   ??? valGANciclovir (VALCYTE) 450 mg tablet Take 1 tablet (450 mg total) by mouth Two (2) times a day. 60 tablet 11   ??? MYFORTIC 360 mg TbEC Take 2 tablets (720 mg total) by mouth Two (2) times a day. Z94.0; Kidney replaced by transplant 02/03/07 120 tablet 0   ??? pioglitazone (ACTOS) 30 MG tablet Take 30 mg by mouth daily.     ??? predniSONE (DELTASONE) 5 MG tablet Take one tablet daily. 90 tablet 3   ??? sodium bicarbonate 650 mg tablet Take 2 tablets (1,300 mg total) by mouth Two (2) times a day. 120 tablet 11     No current facility-administered medications for this visit.      Physical Exam    BP 132/80 (BP Site: L Arm, BP Position: Sitting, BP Cuff Size: Medium)  - Pulse 71  - Temp 36.6 ??C (97.8 ??F) (Temporal)  - Ht 180.3 cm (5' 10.98)  - Wt (!) 149.4 kg (329 lb 6.4 oz)  - BMI 45.96 kg/m??   General: Patient is a pleasant male in no apparent distress.  Eyes: Sclera anicteric.  ENT: Oropharynx without lesions.   Neck: Supple without LAD/JVD/bruits.  Lungs: Clear to auscultation bilaterally, no wheezes/rales/rhonchi.  Cardiovascular: Regular rate and rhythm without murmurs, rubs or gallops.  Abdomen: Soft, notender/nondistended. Positive bowel sounds. No tenderness over the graft.  Extremities: 1+ edema in lower extremities bilaterally.  Skin: Without rash.  Neurological: Grossly nonfocal.  Psychiatric: Mood and affect appropriate.    Laboratory Results    Recent Results (from the past 170 hour(s))   Comprehensive Metabolic Panel    Collection Time: 08/27/17  9:55 AM   Result Value Ref Range    Sodium 141 135 - 145 mmol/L    Potassium 4.1 3.5 - 5.0 mmol/L    Chloride 108 (H) 98 - 107 mmol/L    CO2 23.0 22.0 - 30.0 mmol/L    BUN 19 7 - 21 mg/dL    Creatinine 1.61 (H) 0.70 - 1.30 mg/dL    BUN/Creatinine Ratio 11     EGFR MDRD Non Af Amer 43 (L) >=60 mL/min/1.34m2    EGFR MDRD Af Amer 52 (L) >=60 mL/min/1.56m2    Anion Gap 10 9 - 15 mmol/L    Glucose 135 (H) 65 - 99 mg/dL    Calcium 9.4 8.5 - 09.6 mg/dL    Albumin 3.7 3.5 - 5.0 g/dL    Total Protein 5.9 (L) 6.5 - 8.3 g/dL    Total Bilirubin 0.5 0.0 - 1.2 mg/dL    AST 26 19 - 55 U/L    ALT 38 19 - 72 U/L    Alkaline Phosphatase 74 38 - 126 U/L   CMV DNA, quantitative, PCR    Collection Time: 08/27/17  9:55 AM   Result Value Ref Range    CMV Viral Ld Not Detected Not Detected    CMV Quant  <0 IU/mL    CMV Quant Log10  <0.00 log IU/mL    CMV Comment     Tacrolimus Level, Trough    Collection Time: 08/27/17  9:55 AM   Result Value Ref Range    Tacrolimus, Trough 5.7 <=20.0 ng/mL   Vitamin D 25 Hydroxy (25OH D2 + D3)    Collection Time: 08/27/17  9:55 AM   Result Value Ref Range    Vitamin D Total (25OH) 20.9 20.0 - 80.0 ng/mL   Hemoglobin A1c    Collection Time: 08/27/17  9:55 AM   Result Value Ref Range    Hemoglobin A1C 8.7 (H) 4.8 - 5.6 %    Estimated Average Glucose 203 mg/dL   Parathyroid Hormone (PTH)    Collection Time: 08/27/17  9:55 AM   Result Value Ref Range    PTH 243.0 (H) 12.0 - 72.0 pg/mL    Calcium 9.4 8.5 - 10.2 mg/dL   Phosphorus Level    Collection Time: 08/27/17  9:55 AM   Result Value Ref Range    Phosphorus 3.1 2.9 - 4.7 mg/dL   Magnesium Level    Collection Time: 08/27/17  9:55 AM   Result Value Ref Range    Magnesium 1.7 1.6 - 2.2 mg/dL   CBC w/ Differential    Collection Time: 08/27/17  9:55 AM   Result Value Ref Range    WBC 3.9 (L) 4.5 - 11.0 10*9/L    RBC 3.80 (L) 4.50 - 5.90 10*12/L    HGB 10.5 (L) 13.5 - 17.5 g/dL    HCT 86.5 (L) 78.4 - 53.0 %    MCV 87.9 80.0 - 100.0 fL    MCH 27.7 26.0 - 34.0 pg    MCHC 31.5 31.0 - 37.0 g/dL    RDW 69.6 (H) 29.5 - 15.0 %    MPV 8.5 7.0 - 10.0 fL    Platelet 257 150 - 440 10*9/L    Neutrophil Left Shift 2+ (A) Not Present    Absolute Neutrophils 3.1 2.0 - 7.5 10*9/L    Absolute Lymphocytes 0.4 (L) 1.5 - 5.0 10*9/L    Absolute Monocytes 0.3 0.2 - 0.8 10*9/L    Absolute Eosinophils 0.1 0.0 - 0.4 10*9/L    Absolute Basophils 0.0 0.0 - 0.1 10*9/L    Large Unstained Cells 1 0 - 4 %    Anisocytosis Slight (A) Not Present    Hypochromasia Slight (A) Not Present   Morphology Review    Collection Time: 08/27/17  9:55 AM   Result Value Ref Range    Smear Review Comments See Comment (A) Undefined    Dohle Bodies Present (A) Not Present    Hypersegmented Neutrophils Present (A) Not Present   Protein/Creatinine Ratio, Urine    Collection Time: 08/27/17 10:21 AM   Result Value Ref Range    Creat U 62.1 Undefined mg/dL    Protein, Ur 28.4 Undefined mg/dL    Protein/Creatinine Ratio, Urine 1.565 Undefined   Urine Culture    Collection Time: 08/27/17 10:21 AM   Result Value Ref Range    Urine Culture, Comprehensive 10,000 to 50,000 CFU/mL Streptococcus mitis group (A)        Assessment and Plan    1. S/p living donor renal transplant. His creatinine is actually slightly below his most recent baseline today. His Prograf level of 5.3 is slightly below his goal of 6-8, but it has been trending up since we increased his dose in early August. Will continue current immunosuppression for now. May need to increase Prograf if his level is consistently below 6. He had been on Bactrim and Valcyte prophylaxis from a previous rejection episode treated with Thymoglobulin back in March. He was advised that it was okay to stop taking those.    2. Recent hospitalization for presumed UTI. His urine culture today grew a low level of strep mitis. Unfortunately a urinalysis was not sent. He was asymptomatic, so would not treat at this time.    3. Hypertension. Blood pressure controlled on current regimen.  4. Proteinuria. His urine protein to creatinine ratio has increased to 1.5 today. He had been off ACE inhibitor due to hyperkalemia. If his potassium remains normal, would consider restarting that at his next visit.    5. Obesity. Continue to encourage weight loss.    6. Bilateral lower extremity edema/obstructive sleep apnea. Improved with regular CPAP use.     7. Diabetes Mellitus. Hemoglobin A1c remains elevated at 8.7. Followed by [facility].    8. Health maintenance. He was encouraged to get a flu shot when they are available.    9. Will see patient back in 4 months, or sooner if needed.    Scribe's Attestation: Lisbeth Ply, MD obtained and performed the history, physical exam and medical decision making elements that were entered into the chart.  Signed by Delaney Meigs, Scribe, on August 27, 2017 11:01 AM  .    ----------------------------------------------------------------------------------------------------------------------  September 16, 2017 8:50 PM. Documentation assistance provided by the Scribe. I was present during the time the encounter was recorded. The information recorded by the Scribe was done at my direction and has been reviewed and validated by me. ----------------------------------------------------------------------------------------------------------------------

## 2017-08-28 LAB — VITAMIN D, TOTAL (25OH): Lab: 20.9

## 2017-08-29 LAB — CMV DNA, QUANTITATIVE, PCR

## 2017-08-29 LAB — CMV QUANT: Lab: 0

## 2017-09-02 LAB — BK BLOOD LOG(10): Lab: 0

## 2017-09-02 LAB — BK VIRAL LOAD (QUANTITATIVE): BK BLOOD RESULT: NOT DETECTED

## 2017-09-03 NOTE — Unmapped (Signed)
Patient has been approved to receive free CellCept from [employer] til 09/02/2018. Medication will be shipped directly from [employer] to the patient.

## 2017-09-18 ENCOUNTER — Ambulatory Visit
Admission: RE | Admit: 2017-09-18 | Discharge: 2017-09-18 | Disposition: A | Discharge status: Home / Self Care | Payer: MEDICARE

## 2017-09-18 DIAGNOSIS — Z940 Kidney transplant status: Principal | ICD-10-CM

## 2017-09-18 LAB — CBC W/ AUTO DIFF
BASOPHILS ABSOLUTE COUNT: 0 10*9/L (ref 0.0–0.1)
EOSINOPHILS ABSOLUTE COUNT: 0.3 10*9/L (ref 0.0–0.4)
HEMATOCRIT: 32.9 % — ABNORMAL LOW (ref 41.0–53.0)
HEMOGLOBIN: 10.9 g/dL — ABNORMAL LOW (ref 13.5–17.5)
LARGE UNSTAINED CELLS: 2 % (ref 0–4)
LYMPHOCYTES ABSOLUTE COUNT: 0.7 10*9/L — ABNORMAL LOW (ref 1.5–5.0)
MEAN CORPUSCULAR HEMOGLOBIN CONC: 33.1 g/dL (ref 31.0–37.0)
MEAN CORPUSCULAR HEMOGLOBIN: 28.6 pg (ref 26.0–34.0)
MEAN CORPUSCULAR VOLUME: 86.6 fL (ref 80.0–100.0)
MEAN PLATELET VOLUME: 8.5 fL (ref 7.0–10.0)
MONOCYTES ABSOLUTE COUNT: 0.5 10*9/L (ref 0.2–0.8)
NEUTROPHILS ABSOLUTE COUNT: 7.1 10*9/L (ref 2.0–7.5)
PLATELET COUNT: 228 10*9/L (ref 150–440)
RED CELL DISTRIBUTION WIDTH: 15.3 % — ABNORMAL HIGH (ref 12.0–15.0)
WBC ADJUSTED: 8.8 10*9/L (ref 4.5–11.0)

## 2017-09-18 LAB — MAGNESIUM: Magnesium:MCnc:Pt:Ser/Plas:Qn:: 1.6

## 2017-09-18 LAB — COMPREHENSIVE METABOLIC PANEL
ALBUMIN: 3.5 g/dL (ref 3.5–5.0)
ALT (SGPT): 39 U/L (ref 19–72)
ANION GAP: 11 mmol/L (ref 9–15)
BILIRUBIN TOTAL: 0.5 mg/dL (ref 0.0–1.2)
BLOOD UREA NITROGEN: 17 mg/dL (ref 7–21)
BUN / CREAT RATIO: 9
CALCIUM: 9.4 mg/dL (ref 8.5–10.2)
CHLORIDE: 107 mmol/L (ref 98–107)
CREATININE: 1.79 mg/dL — ABNORMAL HIGH (ref 0.70–1.30)
EGFR MDRD AF AMER: 53 mL/min/{1.73_m2} — ABNORMAL LOW (ref >=60)
EGFR MDRD NON AF AMER: 43 mL/min/{1.73_m2} — ABNORMAL LOW (ref >=60)
GLUCOSE RANDOM: 128 mg/dL — ABNORMAL HIGH (ref 65–99)
POTASSIUM: 4.3 mmol/L (ref 3.5–5.0)
PROTEIN TOTAL: 5.8 g/dL — ABNORMAL LOW (ref 6.5–8.3)
SODIUM: 141 mmol/L (ref 135–145)

## 2017-09-18 LAB — PHOSPHORUS: Phosphate:MCnc:Pt:Ser/Plas:Qn:: 3.1

## 2017-09-18 LAB — ALBUMIN: Albumin:MCnc:Pt:Ser/Plas:Qn:: 3.5

## 2017-09-18 LAB — MEAN CORPUSCULAR HEMOGLOBIN CONC: Lab: 33.1

## 2017-09-18 LAB — TACROLIMUS, TROUGH: Lab: 4.7

## 2017-09-18 NOTE — Unmapped (Signed)
Reviewed Urine Culture  10,000 to 50,000 CFU/mL Streptococcus mitis group with Dr Carlene Coria. No orders given

## 2017-10-15 ENCOUNTER — Ambulatory Visit
Admission: RE | Admit: 2017-10-15 | Discharge: 2017-10-15 | Disposition: A | Discharge status: Home / Self Care | Payer: MEDICARE

## 2017-10-15 DIAGNOSIS — Z940 Kidney transplant status: Principal | ICD-10-CM

## 2017-10-15 LAB — COMPREHENSIVE METABOLIC PANEL
ALBUMIN: 3.4 g/dL — ABNORMAL LOW (ref 3.5–5.0)
ALT (SGPT): 31 U/L (ref 19–72)
ANION GAP: 7 mmol/L — ABNORMAL LOW (ref 9–15)
AST (SGOT): 22 U/L (ref 19–55)
BILIRUBIN TOTAL: 0.5 mg/dL (ref 0.0–1.2)
BLOOD UREA NITROGEN: 20 mg/dL (ref 7–21)
BUN / CREAT RATIO: 12
CALCIUM: 9.5 mg/dL (ref 8.5–10.2)
CHLORIDE: 109 mmol/L — ABNORMAL HIGH (ref 98–107)
CO2: 25 mmol/L (ref 22.0–30.0)
CREATININE: 1.71 mg/dL — ABNORMAL HIGH (ref 0.70–1.30)
EGFR MDRD AF AMER: 55 mL/min/{1.73_m2} — ABNORMAL LOW (ref >=60)
EGFR MDRD NON AF AMER: 46 mL/min/{1.73_m2} — ABNORMAL LOW (ref >=60)
GLUCOSE RANDOM: 154 mg/dL — ABNORMAL HIGH (ref 65–99)
POTASSIUM: 4.2 mmol/L (ref 3.5–5.0)
PROTEIN TOTAL: 5.8 g/dL — ABNORMAL LOW (ref 6.5–8.3)
SODIUM: 141 mmol/L (ref 135–145)

## 2017-10-15 LAB — CBC W/ AUTO DIFF
BASOPHILS ABSOLUTE COUNT: 0 10*9/L (ref 0.0–0.1)
EOSINOPHILS ABSOLUTE COUNT: 0.2 10*9/L (ref 0.0–0.4)
HEMATOCRIT: 34.3 % — ABNORMAL LOW (ref 41.0–53.0)
HEMOGLOBIN: 11 g/dL — ABNORMAL LOW (ref 13.5–17.5)
LARGE UNSTAINED CELLS: 2 % (ref 0–4)
LYMPHOCYTES ABSOLUTE COUNT: 0.8 10*9/L — ABNORMAL LOW (ref 1.5–5.0)
MEAN CORPUSCULAR HEMOGLOBIN: 27 pg (ref 26.0–34.0)
MEAN CORPUSCULAR VOLUME: 84.3 fL (ref 80.0–100.0)
MONOCYTES ABSOLUTE COUNT: 0.2 10*9/L (ref 0.2–0.8)
NEUTROPHILS ABSOLUTE COUNT: 6.8 10*9/L (ref 2.0–7.5)
PLATELET COUNT: 267 10*9/L (ref 150–440)
RED BLOOD CELL COUNT: 4.07 10*12/L — ABNORMAL LOW (ref 4.50–5.90)
RED CELL DISTRIBUTION WIDTH: 15 % (ref 12.0–15.0)
WBC ADJUSTED: 8.2 10*9/L (ref 4.5–11.0)

## 2017-10-15 LAB — MAGNESIUM: Magnesium:MCnc:Pt:Ser/Plas:Qn:: 1.7

## 2017-10-15 LAB — SMEAR REVIEW

## 2017-10-15 LAB — PHOSPHORUS: Phosphate:MCnc:Pt:Ser/Plas:Qn:: 3.1

## 2017-10-15 LAB — AST (SGOT): Aspartate aminotransferase:CCnc:Pt:Ser/Plas:Qn:: 22

## 2017-10-15 LAB — MEAN CORPUSCULAR HEMOGLOBIN CONC: Lab: 32

## 2017-10-15 LAB — TACROLIMUS, TROUGH: Lab: 4.3

## 2017-10-17 MED ORDER — TACROLIMUS 0.5 MG CAPSULE
ORAL_CAPSULE | Freq: Two times a day (BID) | ORAL | 11 refills | 0 days
Start: 2017-10-17 — End: 2018-02-04

## 2017-10-17 NOTE — Unmapped (Signed)
Prograf 4,8; Pt was previously taking prograf 1mg  in the morning and 0.5 mg at night. Advised to increase to 1 mg BID and repeat labs Wednesday.

## 2017-10-22 ENCOUNTER — Ambulatory Visit
Admission: RE | Admit: 2017-10-22 | Discharge: 2017-10-22 | Disposition: A | Discharge status: Home / Self Care | Payer: MEDICARE

## 2017-10-22 DIAGNOSIS — Z940 Kidney transplant status: Principal | ICD-10-CM

## 2017-10-22 LAB — COMPREHENSIVE METABOLIC PANEL
ALBUMIN: 3.4 g/dL — ABNORMAL LOW (ref 3.5–5.0)
ALKALINE PHOSPHATASE: 101 U/L (ref 38–126)
ALT (SGPT): 29 U/L (ref 19–72)
ANION GAP: 9 mmol/L (ref 9–15)
BILIRUBIN TOTAL: 0.6 mg/dL (ref 0.0–1.2)
BLOOD UREA NITROGEN: 17 mg/dL (ref 7–21)
BUN / CREAT RATIO: 9
CALCIUM: 9.4 mg/dL (ref 8.5–10.2)
CHLORIDE: 109 mmol/L — ABNORMAL HIGH (ref 98–107)
CO2: 20 mmol/L — ABNORMAL LOW (ref 22.0–30.0)
CREATININE: 1.9 mg/dL — ABNORMAL HIGH (ref 0.70–1.30)
EGFR MDRD AF AMER: 49 mL/min/{1.73_m2} — ABNORMAL LOW (ref >=60)
EGFR MDRD NON AF AMER: 41 mL/min/{1.73_m2} — ABNORMAL LOW (ref >=60)
GLUCOSE RANDOM: 174 mg/dL (ref 65–179)
POTASSIUM: 4.4 mmol/L (ref 3.5–5.0)
PROTEIN TOTAL: 5.6 g/dL — ABNORMAL LOW (ref 6.5–8.3)
SODIUM: 138 mmol/L (ref 135–145)

## 2017-10-22 LAB — TACROLIMUS, TROUGH: Lab: 6

## 2017-10-22 LAB — CBC W/ AUTO DIFF
EOSINOPHILS ABSOLUTE COUNT: 0.2 10*9/L (ref 0.0–0.4)
HEMATOCRIT: 34.2 % — ABNORMAL LOW (ref 41.0–53.0)
HEMOGLOBIN: 11 g/dL — ABNORMAL LOW (ref 13.5–17.5)
LYMPHOCYTES ABSOLUTE COUNT: 0.9 10*9/L — ABNORMAL LOW (ref 1.5–5.0)
MEAN CORPUSCULAR HEMOGLOBIN CONC: 32.3 g/dL (ref 31.0–37.0)
MEAN CORPUSCULAR HEMOGLOBIN: 27.5 pg (ref 26.0–34.0)
MEAN CORPUSCULAR VOLUME: 85.2 fL (ref 80.0–100.0)
MEAN PLATELET VOLUME: 8.4 fL (ref 7.0–10.0)
MONOCYTES ABSOLUTE COUNT: 0.3 10*9/L (ref 0.2–0.8)
NEUTROPHILS ABSOLUTE COUNT: 5.4 10*9/L (ref 2.0–7.5)
PLATELET COUNT: 227 10*9/L (ref 150–440)
RED BLOOD CELL COUNT: 4.01 10*12/L — ABNORMAL LOW (ref 4.50–5.90)
RED CELL DISTRIBUTION WIDTH: 14.6 % (ref 12.0–15.0)
WBC ADJUSTED: 6.9 10*9/L (ref 4.5–11.0)

## 2017-10-22 LAB — PHOSPHORUS: Phosphate:MCnc:Pt:Ser/Plas:Qn:: 3.4

## 2017-10-22 LAB — MAGNESIUM: Magnesium:MCnc:Pt:Ser/Plas:Qn:: 1.6

## 2017-10-22 LAB — EGFR MDRD AF AMER
Glomerular filtration rate/1.73 sq M.predicted.black:ArVRat:Pt:Ser/Plas/Bld:Qn:Creatinine-based formula (MDRD): 49 — ABNORMAL LOW

## 2017-10-22 LAB — SMEAR REVIEW

## 2017-10-22 LAB — MEAN PLATELET VOLUME: Lab: 8.4

## 2017-10-28 NOTE — Unmapped (Signed)
[facility] orders updated

## 2017-11-28 LAB — BASIC METABOLIC PANEL
CHLORIDE: 107 mmol/L — ABNORMAL HIGH (ref 96–106)
CO2: 22 mmol/L (ref 20–29)
CREATININE: 2.01 mg/dL — ABNORMAL HIGH (ref 0.76–1.27)
GLUCOSE: 140 mg/dL — ABNORMAL HIGH (ref 65–99)
POTASSIUM: 4.8 mmol/L (ref 3.5–5.2)
SODIUM: 143 mmol/L (ref 134–144)

## 2017-11-28 LAB — CBC W/ DIFFERENTIAL
BANDED NEUTROPHILS ABSOLUTE COUNT: 0 10*3/uL (ref 0.0–0.1)
BASOPHILS ABSOLUTE COUNT: 0 10*3/uL (ref 0.0–0.2)
BASOPHILS RELATIVE PERCENT: 0 %
EOSINOPHILS ABSOLUTE COUNT: 0.2 10*3/uL (ref 0.0–0.4)
EOSINOPHILS RELATIVE PERCENT: 2 %
HEMATOCRIT: 34.2 % — ABNORMAL LOW (ref 37.5–51.0)
IMMATURE GRANULOCYTES: 1 %
LYMPHOCYTES ABSOLUTE COUNT: 0.9 10*3/uL (ref 0.7–3.1)
LYMPHOCYTES RELATIVE PERCENT: 11 %
MEAN CORPUSCULAR HEMOGLOBIN CONC: 31.3 g/dL — ABNORMAL LOW (ref 31.5–35.7)
MEAN CORPUSCULAR HEMOGLOBIN: 25.2 pg — ABNORMAL LOW (ref 26.6–33.0)
MEAN CORPUSCULAR VOLUME: 81 fL (ref 79–97)
MONOCYTES ABSOLUTE COUNT: 0.8 10*3/uL (ref 0.1–0.9)
MONOCYTES RELATIVE PERCENT: 10 %
NEUTROPHILS ABSOLUTE COUNT: 6.1 10*3/uL (ref 1.4–7.0)
NEUTROPHILS RELATIVE PERCENT: 76 %
PLATELET COUNT: 272 10*3/uL (ref 150–379)
RED BLOOD CELL COUNT: 4.25 x10E6/uL (ref 4.14–5.80)
RED CELL DISTRIBUTION WIDTH: 15.5 % — ABNORMAL HIGH (ref 12.3–15.4)
WHITE BLOOD CELL COUNT: 8 10*3/uL (ref 3.4–10.8)

## 2017-11-28 LAB — POTASSIUM: Lab: 4.8

## 2017-11-28 LAB — PHOSPHORUS: PHOSPHORUS, SERUM: 3.3 mg/dL (ref 2.5–4.5)

## 2017-11-28 LAB — PHOSPHORUS, SERUM: Lab: 3.3

## 2017-11-28 LAB — EOSINOPHILS ABSOLUTE COUNT: Lab: 0.2

## 2017-11-28 LAB — MAGNESIUM: Lab: 1.8

## 2017-11-29 LAB — TACROLIMUS BLOOD: Lab: 11.2

## 2017-12-02 NOTE — Unmapped (Signed)
Pt advised to repeat labs  Prograf level 11.2.

## 2017-12-17 LAB — CBC W/ DIFFERENTIAL
BANDED NEUTROPHILS ABSOLUTE COUNT: 0 10*3/uL (ref 0.0–0.1)
BASOPHILS ABSOLUTE COUNT: 0 10*3/uL (ref 0.0–0.2)
BASOPHILS RELATIVE PERCENT: 0 %
EOSINOPHILS ABSOLUTE COUNT: 0.2 10*3/uL (ref 0.0–0.4)
EOSINOPHILS RELATIVE PERCENT: 3 %
HEMATOCRIT: 31.8 % — ABNORMAL LOW (ref 37.5–51.0)
HEMOGLOBIN: 9.9 g/dL — ABNORMAL LOW (ref 13.0–17.7)
LYMPHOCYTES ABSOLUTE COUNT: 1 10*3/uL (ref 0.7–3.1)
LYMPHOCYTES RELATIVE PERCENT: 14 %
MEAN CORPUSCULAR HEMOGLOBIN CONC: 31.1 g/dL — ABNORMAL LOW (ref 31.5–35.7)
MEAN CORPUSCULAR HEMOGLOBIN: 24.9 pg — ABNORMAL LOW (ref 26.6–33.0)
MEAN CORPUSCULAR VOLUME: 80 fL (ref 79–97)
MONOCYTES ABSOLUTE COUNT: 0.6 10*3/uL (ref 0.1–0.9)
MONOCYTES RELATIVE PERCENT: 9 %
NEUTROPHILS ABSOLUTE COUNT: 4.9 10*3/uL (ref 1.4–7.0)
PLATELET COUNT: 252 10*3/uL (ref 150–379)
RED BLOOD CELL COUNT: 3.97 x10E6/uL — ABNORMAL LOW (ref 4.14–5.80)
RED CELL DISTRIBUTION WIDTH: 15.4 % (ref 12.3–15.4)
WHITE BLOOD CELL COUNT: 6.7 10*3/uL (ref 3.4–10.8)

## 2017-12-17 LAB — BASIC METABOLIC PANEL
BLOOD UREA NITROGEN: 19 mg/dL (ref 6–20)
BUN / CREAT RATIO: 10 (ref 9–20)
CALCIUM: 9.3 mg/dL (ref 8.7–10.2)
CO2: 15 mmol/L — ABNORMAL LOW (ref 20–29)
CREATININE: 1.95 mg/dL — ABNORMAL HIGH (ref 0.76–1.27)
POTASSIUM: 4.3 mmol/L (ref 3.5–5.2)
SODIUM: 142 mmol/L (ref 134–144)

## 2017-12-17 LAB — PHOSPHORUS, SERUM: Lab: 3.6

## 2017-12-17 LAB — BUN / CREAT RATIO: Lab: 10

## 2017-12-17 LAB — BASOPHILS ABSOLUTE COUNT: Lab: 0

## 2017-12-17 LAB — MAGNESIUM: Lab: 1.8

## 2017-12-18 LAB — TACROLIMUS BLOOD: Lab: 6.1

## 2018-01-01 NOTE — Unmapped (Signed)
Transplant Nephrology Clinic Visit      History of Present Illness    Patient is a 37 y.o. year old male who underwent living donor transplant on 02/03/2007 secondary to hypertension. 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 This is detailed in the clinic note from 12/09/2012 and from 08/27/17. He has historical evidence of donor specific antibody to DQ7 with MFI of 1057 last on 01/23/2016 and DQA1*05 with MFI 7687 last on 05/01/15. DSA screens since 02/08/2016 to 08/21/16 had been negative. DSA screen on 06/02/2017 revealed a DSA's to DQ7 at MFI 7563.  ??  His most recent baseline creatinine has been between 1.8 to 2.5.  ??  Interval history since last seen 08/27/2017    He presents today feeling well. He has been going to the gym everyday and he believes he has lost about 20 pounds over the last 18 months. He reports having occasional low blood sugar spells at the gym and now knows how to avoid that. He is attempting to get a Freestyle insulin pump to help manage his diabetes. He currently has a no productive cough with no other associated symptoms, not bothering him much.    Review of Systems    Otherwise on review of systems patient denies fever or chills, chest pain, SOB, PND or orthopnea, lower extremity edema. Denies N/V/abdominal pain. No dysuria, hematuria or difficulty voiding. Bowel movements normal. Denies joint pain or rash. All other systems are reviewed and are negative.    Medications    Current Outpatient Prescriptions   Medication Sig Dispense Refill   ??? amLODIPine (NORVASC) 10 MG tablet Take 1 tablet (10 mg total) by mouth daily. 90 tablet 3   ??? aspirin (ADULT LOW DOSE ASPIRIN) 81 MG tablet Take 1 tablet (81 mg total) by mouth daily. 30 tablet 3   ??? atorvastatin (LIPITOR) 10 MG tablet Take 1 tablet (10 mg total) by mouth nightly. 30 tablet 11   ??? carvedilol (COREG) 25 MG tablet TAKE 1 TABLET BY MOUTH TWICE DAILY 60 tablet 11   ??? CELLCEPT 500 mg tablet Take 2 tablets (1,000 mg total) by mouth every twelve (12) hours. Z94.0; trans 360 tablet 3   ??? cloNIDine HCl (CATAPRES) 0.2 MG tablet TAKE 1 TABLET BY MOUTH TWICE DAILY 60 tablet 11   ??? ergocalciferol (VITAMIN D2) 50,000 unit capsule Take 1 capsule (50,000 Units total) by mouth once a week. 12 capsule 3   ??? flash glucose sensor kit by Other route every fourteen (14) days.     ??? furosemide (LASIX) 40 MG tablet Pt advised to take 40 mg daily and 80 mg every other day- PRN 30 tablet 11   ??? glimepiride (AMARYL) 4 MG tablet Take 4 mg by mouth every morning before breakfast.     ??? insulin glargine (LANTUS) 100 unit/mL injection Inject 0.3 mL (30 Units total) under the skin nightly. 9 mL 0   ??? insulin lispro (HUMALOG) 100 unit/mL injection Inject 0.03 mL (3 Units total) under the skin Three (3) times a day with a meal. (Patient taking differently: Inject 10 Units under the skin Three (3) times a day with a meal. ) 3 mL 0   ??? pen needle, diabetic (BD ULTRA-FINE NANO PEN NEEDLES) 32 gauge x 5/32 Ndle 1 each by Miscellaneous route Four (4) times a day. 100 each 3   ??? pioglitazone (  ACTOS) 30 MG tablet Take 30 mg by mouth daily.     ??? predniSONE (DELTASONE) 5 MG tablet Take one tablet daily. 90 tablet 3   ??? sodium bicarbonate 650 mg tablet Take 2 tablets (1,300 mg total) by mouth Two (2) times a day. 120 tablet 11   ??? tacrolimus (PROGRAF) 0.5 MG capsule Take 2 capsules (1 mg total) by mouth Two (2) times a day. Z94.0 kidney transplant date 02/03/17; 120 capsule 11   ??? acetaminophen (TYLENOL) 325 MG tablet Take 2 tablets (650 mg total) by mouth every six (6) hours as needed. (Patient not taking: Reported on 01/02/2018) 60 tablet 0   ??? citalopram (CELEXA) 40 MG tablet Take 1 tablet (40 mg total) by mouth daily. (Patient not taking: Reported on 01/02/2018) 30 tablet 11   ??? magnesium oxide (MAGOX) 400 mg tablet Take 1 tablet (400 mg total) by mouth daily. 200 tablet 1   ??? sulfamethoxazole-trimethoprim (BACTRIM,SEPTRA) 400-80 mg per tablet Take 1 tablet (80 mg of trimethoprim total) by mouth 3 (three) times a week. (Patient not taking: Reported on 01/02/2018) 30 tablet 3   ??? valGANciclovir (VALCYTE) 450 mg tablet Take 1 tablet (450 mg total) by mouth Two (2) times a day. (Patient not taking: Reported on 01/02/2018) 60 tablet 11     No current facility-administered medications for this visit.      Physical Exam    BP 138/74 (BP Site: L Arm, BP Position: Sitting, BP Cuff Size: Medium)  - Pulse 71  - Temp 36.9 ??C (98.5 ??F) (Temporal)  - Ht 180.3 cm (5' 10.98)  - Wt (!) 147.7 kg (325 lb 11.2 oz)  - BMI 45.45 kg/m??   General: Patient is a pleasant male in no apparent distress.  Eyes: Sclera anicteric.  ENT: Oropharynx without lesions.   Neck: Supple without LAD/JVD/bruits.  Lungs: Clear to auscultation bilaterally, no wheezes/rales/rhonchi.  Cardiovascular: Regular rate and rhythm without murmurs, rubs or gallops.  Abdomen: Soft, notender/nondistended. Positive bowel sounds. No tenderness over the graft.  Extremities: 1+ edema in lower extremities bilaterally.  Skin: Without rash.  Neurological: Grossly nonfocal.  Psychiatric: Mood and affect appropriate.    Laboratory Results    Recent Results (from the past 170 hour(s))   Comprehensive Metabolic Panel    Collection Time: 01/02/18  9:13 AM   Result Value Ref Range    Sodium 139 135 - 145 mmol/L    Potassium 4.2 3.5 - 5.0 mmol/L    Chloride 110 (H) 98 - 107 mmol/L    CO2 20.0 (L) 22.0 - 30.0 mmol/L    BUN 29 (H) 7 - 21 mg/dL    Creatinine 1.61 (H) 0.70 - 1.30 mg/dL    BUN/Creatinine Ratio 13     EGFR MDRD Non Af Amer 34 (L) >=60 mL/min/1.57m2    EGFR MDRD Af Amer 41 (L) >=60 mL/min/1.53m2    Anion Gap 9 9 - 15 mmol/L    Glucose 133 (H) 65 - 99 mg/dL    Calcium 9.4 8.5 - 09.6 mg/dL    Albumin 3.6 3.5 - 5.0 g/dL    Total Protein 5.6 (L) 6.5 - 8.3 g/dL    Total Bilirubin 0.4 0.0 - 1.2 mg/dL    AST 20 19 - 55 U/L    ALT 18 (L) 19 - 72 U/L    Alkaline Phosphatase 90 38 - 126 U/L   Tacrolimus Level, Trough    Collection Time: 01/02/18  9:13 AM   Result Value Ref Range  Tacrolimus, Trough 11.0 <=20.0 ng/mL   Vitamin D 25 Hydroxy (25OH D2 + D3)    Collection Time: 01/02/18  9:13 AM   Result Value Ref Range    Vitamin D Total (25OH) 22.8 20.0 - 80.0 ng/mL   Magnesium Level    Collection Time: 01/02/18  9:13 AM   Result Value Ref Range    Magnesium 1.6 1.6 - 2.2 mg/dL   Phosphorus Level    Collection Time: 01/02/18  9:13 AM   Result Value Ref Range    Phosphorus 4.1 2.9 - 4.7 mg/dL   BK Virus Quantitative PCR, Blood    Collection Time: 01/02/18  9:13 AM   Result Value Ref Range    BK Blood Result Not Detected Not Detected    BK Blood Quant  <=0 IU/mL    BK Blood Log(10)  <0.00 log IU/mL    Bk Blood Comment     CBC w/ Differential    Collection Time: 01/02/18  9:13 AM   Result Value Ref Range    WBC 6.1 4.5 - 11.0 10*9/L    RBC 4.16 (L) 4.50 - 5.90 10*12/L    HGB 10.4 (L) 13.5 - 17.5 g/dL    HCT 81.1 (L) 91.4 - 53.0 %    MCV 80.0 80.0 - 100.0 fL    MCH 24.9 (L) 26.0 - 34.0 pg    MCHC 31.2 31.0 - 37.0 g/dL    RDW 78.2 (H) 95.6 - 15.0 %    MPV 8.1 7.0 - 10.0 fL    Platelet 232 150 - 440 10*9/L    Absolute Neutrophils 4.5 2.0 - 7.5 10*9/L    Absolute Lymphocytes 0.9 (L) 1.5 - 5.0 10*9/L    Absolute Monocytes 0.4 0.2 - 0.8 10*9/L    Absolute Eosinophils 0.2 0.0 - 0.4 10*9/L    Absolute Basophils 0.0 0.0 - 0.1 10*9/L    Large Unstained Cells 2 0 - 4 %    Microcytosis Slight (A) Not Present    Hypochromasia Moderate (A) Not Present   Urinalysis    Collection Time: 01/02/18 12:24 PM   Result Value Ref Range    Color, UA Yellow     Clarity, UA Clear     Specific Gravity, UA 1.011 1.003 - 1.030    pH, UA 6.5 5.0 - 9.0    Leukocyte Esterase, UA Negative Negative    Nitrite, UA Negative Negative    Protein, UA 300 mg/dL (A) Negative    Glucose, UA 70 mg/dL (A) Negative    Ketones, UA Negative Negative    Urobilinogen, UA 0.2 mg/dL 0.2 mg/dL, 1.0 mg/dL    Bilirubin, UA Negative Negative    Blood, UA Trace (A) Negative    RBC, UA <1 <3 /HPF    WBC, UA 1 <2 /HPF    Squam Epithel, UA <1 0 - 5 /HPF    Bacteria, UA None Seen None Seen /HPF   Protein/Creatinine Ratio, Urine    Collection Time: 01/02/18 12:24 PM   Result Value Ref Range    Creat U 84.2 Undefined mg/dL    Protein, Ur 213.0 Undefined mg/dL    Protein/Creatinine Ratio, Urine 4.264 Undefined   Urine Culture    Collection Time: 01/02/18 12:24 PM   Result Value Ref Range    Urine Culture, Comprehensive Mixed Urogenital Flora        Assessment and Plan    1. S/p living donor renal transplant. His creatinine is at the upper limit of  his baseline today. His Prograf level of 11 is a good trough.      2. Hypertension. Blood pressure controlled on current regimen.    3. Proteinuria.  Unfortunately, his urine protein to creatinine ratio is up to 4.2.  Given his history of antibody mediated rejection, will need a  renal biopsy to rule out rejection.  He does have a high titer DQ7 donor specific antibody by his DSA panel today.    4. Obesity.  Praised for his weight loss efforts today.  He is down 4 pounds from his last visit.    5. Diabetes Mellitus. Hemoglobin A1c was not sent today.  Last we have was 8.7 on 08/27/17.  Followed by [facility] at [facility].    6. Health maintenance. He received a flu shot at his last clinic visit.    7. Will see patient back in 4 months, or sooner based on renal biopsy results.    Scribe's Attestation: Lisbeth Ply, MD obtained and performed the history, physical exam and medical decision making elements that were entered into the chart.  Signed by [place] Ormond Foster, Scribe, on January 01, 2018 at 3:06 PM.    ----------------------------------------------------------------------------------------------------------------------  February 07, 2018 3:19 PM. Documentation assistance provided by the Scribe. I was present during the time the encounter was recorded. The information recorded by the Scribe was done at my direction and has been reviewed and validated by me.  ----------------------------------------------------------------------------------------------------------------------

## 2018-01-02 ENCOUNTER — Ambulatory Visit: Admit: 2018-01-02 | Discharge: 2018-01-02 | Payer: MEDICARE

## 2018-01-02 ENCOUNTER — Ambulatory Visit: Admit: 2018-01-02 | Discharge: 2018-01-02 | Payer: MEDICARE | Attending: Nephrology | Primary: Nephrology

## 2018-01-02 DIAGNOSIS — D899 Disorder involving the immune mechanism, unspecified: Secondary | ICD-10-CM

## 2018-01-02 DIAGNOSIS — E559 Vitamin D deficiency, unspecified: Secondary | ICD-10-CM

## 2018-01-02 DIAGNOSIS — Z940 Kidney transplant status: Principal | ICD-10-CM

## 2018-01-02 DIAGNOSIS — Z0000 Encounter for general adult medical examination without abnormal findings: Secondary | ICD-10-CM

## 2018-01-02 DIAGNOSIS — Z23 Encounter for immunization: Secondary | ICD-10-CM

## 2018-01-02 LAB — PROTEIN / CREATININE RATIO, URINE
PROTEIN URINE: 359 mg/dL
PROTEIN/CREAT RATIO, URINE: 4.264

## 2018-01-02 LAB — URINALYSIS
BACTERIA: NONE SEEN /HPF
BILIRUBIN UA: NEGATIVE
GLUCOSE UA: 70 — AB
KETONES UA: NEGATIVE
LEUKOCYTE ESTERASE UA: NEGATIVE
NITRITE UA: NEGATIVE
PH UA: 6.5 (ref 5.0–9.0)
PROTEIN UA: 300 — AB
RBC UA: <1 /HPF (ref <3)
SPECIFIC GRAVITY UA: 1.011 (ref 1.003–1.030)
SQUAMOUS EPITHELIAL: <1 /HPF (ref 0–5)
UROBILINOGEN UA: 0.2
WBC UA: 1 /HPF (ref <2)

## 2018-01-02 LAB — CBC W/ AUTO DIFF
BASOPHILS ABSOLUTE COUNT: 0 10*9/L (ref 0.0–0.1)
EOSINOPHILS ABSOLUTE COUNT: 0.2 10*9/L (ref 0.0–0.4)
HEMATOCRIT: 33.3 % — ABNORMAL LOW (ref 41.0–53.0)
HEMOGLOBIN: 10.4 g/dL — ABNORMAL LOW (ref 13.5–17.5)
LARGE UNSTAINED CELLS: 2 % (ref 0–4)
LYMPHOCYTES ABSOLUTE COUNT: 0.9 10*9/L — ABNORMAL LOW (ref 1.5–5.0)
MEAN CORPUSCULAR HEMOGLOBIN CONC: 31.2 g/dL (ref 31.0–37.0)
MEAN CORPUSCULAR HEMOGLOBIN: 24.9 pg — ABNORMAL LOW (ref 26.0–34.0)
MEAN CORPUSCULAR VOLUME: 80 fL (ref 80.0–100.0)
MEAN PLATELET VOLUME: 8.1 fL (ref 7.0–10.0)
NEUTROPHILS ABSOLUTE COUNT: 4.5 10*9/L (ref 2.0–7.5)
PLATELET COUNT: 232 10*9/L (ref 150–440)
RED CELL DISTRIBUTION WIDTH: 15.9 % — ABNORMAL HIGH (ref 12.0–15.0)

## 2018-01-02 LAB — VITAMIN D, TOTAL (25OH): Lab: 22.8

## 2018-01-02 LAB — COMPREHENSIVE METABOLIC PANEL
ALBUMIN: 3.6 g/dL (ref 3.5–5.0)
ALKALINE PHOSPHATASE: 90 U/L (ref 38–126)
ALT (SGPT): 18 U/L — ABNORMAL LOW (ref 19–72)
ANION GAP: 9 mmol/L (ref 9–15)
AST (SGOT): 20 U/L (ref 19–55)
BILIRUBIN TOTAL: 0.4 mg/dL (ref 0.0–1.2)
BLOOD UREA NITROGEN: 29 mg/dL — ABNORMAL HIGH (ref 7–21)
BUN / CREAT RATIO: 13
CALCIUM: 9.4 mg/dL (ref 8.5–10.2)
CHLORIDE: 110 mmol/L — ABNORMAL HIGH (ref 98–107)
CO2: 20 mmol/L — ABNORMAL LOW (ref 22.0–30.0)
EGFR MDRD AF AMER: 41 mL/min/{1.73_m2} — ABNORMAL LOW (ref >=60)
EGFR MDRD NON AF AMER: 34 mL/min/{1.73_m2} — ABNORMAL LOW (ref >=60)
GLUCOSE RANDOM: 133 mg/dL — ABNORMAL HIGH (ref 65–99)
POTASSIUM: 4.2 mmol/L (ref 3.5–5.0)
PROTEIN TOTAL: 5.6 g/dL — ABNORMAL LOW (ref 6.5–8.3)

## 2018-01-02 LAB — MAGNESIUM: Magnesium:MCnc:Pt:Ser/Plas:Qn:: 1.6

## 2018-01-02 LAB — PH UA: Lab: 6.5

## 2018-01-02 LAB — CREATININE: Creatinine:MCnc:Pt:Ser/Plas:Qn:: 2.21 — ABNORMAL HIGH

## 2018-01-02 LAB — BASOPHILS ABSOLUTE COUNT: Lab: 0

## 2018-01-02 LAB — PROTEIN/CREAT RATIO, URINE: Protein/Creatinine:MRto:Pt:Urine:Qn:: 4.264

## 2018-01-02 LAB — TACROLIMUS, TROUGH: Lab: 11

## 2018-01-02 LAB — BK VIRUS QUANTITATIVE PCR, BLOOD

## 2018-01-02 LAB — BK BLOOD QUANT: Lab: 0

## 2018-01-02 LAB — PHOSPHORUS: Phosphate:MCnc:Pt:Ser/Plas:Qn:: 4.1

## 2018-01-02 NOTE — Unmapped (Signed)
Met with patient in transplant clinic.  He states he feels well- visits gym daily.  Feels he has lost about 20 pounds this year.  Discussed strategies to avoid low blood sugars at the gym- eating a protein/CHO snack before exercising (15 gm CHO), identified examples of those snacks.  Drinking lots of water.  He now has a flash glucose monitoring system that helps him identify blood sugar trends. Sees [facility] in [place] for diabetes. Needs flu vaccine- requested.  Continues to have sone pedal edema.  Takes about one Lasix a week.  No headache, nausea, diarrhea.  No hand tremors.  Denies dysuria. Patient is currently unemployed; disability.  His functional score is currently 90%; Able to carry out normal activiy; minor signs and symptoms of disease.Marland Kitchen

## 2018-01-03 NOTE — Unmapped (Signed)
Creatinine 2.21 with BUN 29.  Tac 11.  Spoke with patient's partner Herbert Seta who states he has been dehydrated from going to the gym.  He will increase fluids and repeat labs early next week.

## 2018-01-05 LAB — CMV DNA, QUANTITATIVE, PCR: CMV QUANT: <50 [IU]/mL — ABNORMAL HIGH (ref <0)

## 2018-01-05 LAB — CMV QUANT LOG10: Lab: 0

## 2018-01-06 LAB — HLA DS POST TRANSPLANT
ANTI-DONOR DRW #1 MFI: 0 MFI
ANTI-DONOR DRW #2 MFI: 51 MFI
ANTI-DONOR HLA-A #1 MFI: 0 MFI
ANTI-DONOR HLA-A #2 MFI: 0 MFI
ANTI-DONOR HLA-B #1 MFI: 7 MFI
ANTI-DONOR HLA-B #2 MFI: 0 MFI
ANTI-DONOR HLA-C #1 MFI: 0 MFI
ANTI-DONOR HLA-C #2 MFI: 0 MFI
ANTI-DONOR HLA-DQB #1 MFI: 0 MFI
ANTI-DONOR HLA-DQB #2 MFI: 13469 MFI — ABNORMAL HIGH
ANTI-DONOR HLA-DR #2 MFI: 0 MFI
DONOR DRW ANTIGEN #1: 53
DONOR DRW ANTIGEN #2: 52
DONOR HLA-A ANTIGEN #2: 30
DONOR HLA-B ANTIGEN #1: 35
DONOR HLA-B ANTIGEN #2: 39
DONOR HLA-C ANTIGEN #1: 4
DONOR HLA-C ANTIGEN #2: 7
DONOR HLA-DQB ANTIGEN #1: 8
DONOR HLA-DQB ANTIGEN #2: 7
DONOR HLA-DR ANTIGEN #1: 4
DONOR HLA-DR ANTIGEN #2: 11

## 2018-01-06 LAB — HLA CLASS 1 ANTIBODY: Lab: 0

## 2018-01-06 LAB — CPRA%: Lab: 0

## 2018-01-06 LAB — FSAB CLASS 1 ANTIBODY SPECIFICITY

## 2018-01-06 LAB — FSAB CLASS 2 ANTIBODY SPECIFICITY

## 2018-01-06 LAB — DONOR HLA-DP ANTIGEN #2: Lab: 0

## 2018-01-09 NOTE — Unmapped (Signed)
Pt agreed to come in for kidney biopsy on 01/16/18.

## 2018-01-16 ENCOUNTER — Ambulatory Visit: Admit: 2018-01-16 | Discharge: 2018-01-18 | Disposition: A | Discharge status: Home / Self Care | Payer: MEDICARE

## 2018-01-16 DIAGNOSIS — N179 Acute kidney failure, unspecified: Principal | ICD-10-CM

## 2018-01-16 DIAGNOSIS — Z940 Kidney transplant status: Principal | ICD-10-CM

## 2018-01-16 LAB — BASIC METABOLIC PANEL
ANION GAP: 8 mmol/L — ABNORMAL LOW (ref 9–15)
CALCIUM: 9.5 mg/dL (ref 8.5–10.2)
CHLORIDE: 106 mmol/L (ref 98–107)
CO2: 25 mmol/L (ref 22.0–30.0)
CREATININE: 1.81 mg/dL — ABNORMAL HIGH (ref 0.70–1.30)
EGFR MDRD AF AMER: 52 mL/min/{1.73_m2} — ABNORMAL LOW (ref >=60)
EGFR MDRD NON AF AMER: 43 mL/min/{1.73_m2} — ABNORMAL LOW (ref >=60)
GLUCOSE RANDOM: 148 mg/dL — ABNORMAL HIGH (ref 65–99)
POTASSIUM: 4.1 mmol/L (ref 3.5–5.0)
SODIUM: 139 mmol/L (ref 135–145)

## 2018-01-16 LAB — CREATININE: Creatinine:MCnc:Pt:Ser/Plas:Qn:: 1.81 — ABNORMAL HIGH

## 2018-01-16 LAB — CBC W/ AUTO DIFF
EOSINOPHILS ABSOLUTE COUNT: 0.2 10*9/L (ref 0.0–0.4)
HEMATOCRIT: 33.4 % — ABNORMAL LOW (ref 41.0–53.0)
HEMOGLOBIN: 10.5 g/dL — ABNORMAL LOW (ref 13.5–17.5)
LARGE UNSTAINED CELLS: 2 % (ref 0–4)
LYMPHOCYTES ABSOLUTE COUNT: 0.7 10*9/L — ABNORMAL LOW (ref 1.5–5.0)
MEAN CORPUSCULAR HEMOGLOBIN: 25.1 pg — ABNORMAL LOW (ref 26.0–34.0)
MEAN CORPUSCULAR VOLUME: 80.1 fL (ref 80.0–100.0)
MEAN PLATELET VOLUME: 8.4 fL (ref 7.0–10.0)
MONOCYTES ABSOLUTE COUNT: 0.5 10*9/L (ref 0.2–0.8)
NEUTROPHILS ABSOLUTE COUNT: 5.6 10*9/L (ref 2.0–7.5)
PLATELET COUNT: 216 10*9/L (ref 150–440)
RED BLOOD CELL COUNT: 4.17 10*12/L — ABNORMAL LOW (ref 4.50–5.90)
RED CELL DISTRIBUTION WIDTH: 16.1 % — ABNORMAL HIGH (ref 12.0–15.0)
WBC ADJUSTED: 7.2 10*9/L (ref 4.5–11.0)

## 2018-01-16 LAB — PROTIME: Lab: 11.5

## 2018-01-16 LAB — CBC
HEMATOCRIT: 32 % — ABNORMAL LOW (ref 41.0–53.0)
HEMOGLOBIN: 10.3 g/dL — ABNORMAL LOW (ref 13.5–17.5)
MEAN CORPUSCULAR HEMOGLOBIN CONC: 32.1 g/dL (ref 31.0–37.0)
MEAN CORPUSCULAR HEMOGLOBIN: 25.6 pg — ABNORMAL LOW (ref 26.0–34.0)
MEAN PLATELET VOLUME: 7.9 fL (ref 7.0–10.0)
PLATELET COUNT: 200 10*9/L (ref 150–440)
RED CELL DISTRIBUTION WIDTH: 16.5 % — ABNORMAL HIGH (ref 12.0–15.0)
WBC ADJUSTED: 6.2 10*9/L (ref 4.5–11.0)

## 2018-01-16 LAB — SMEAR REVIEW

## 2018-01-16 LAB — SLIDE REVIEW

## 2018-01-16 LAB — APTT: Coagulation surface induced:Time:Pt:PPP:Qn:Coag: 28.9

## 2018-01-16 LAB — PLATELET COUNT: Lab: 216

## 2018-01-16 LAB — MEAN PLATELET VOLUME: Lab: 7.9

## 2018-01-16 NOTE — Unmapped (Addendum)
4th pass 4th biopsy taken no bleeding noted. Holding pressure

## 2018-01-16 NOTE — Unmapped (Signed)
Assessment/Plan:    [name] is a 37 y.o. male who will undergo Ultrasound guided transplant kidney biopsy    1. Indications and risks/benefits of procedure reviewed with patient.    2. Consent signed and present on patient's charge.   3. No cardiopulmonary or other medical contraindications present therefore will proceed with biopsy.       CC: No chief complaint on file.      HPI: [name] is a 37 y.o. male who will undergo  Ultrasound guided transplant kidney biopsy with moderate sedation.    Allergies:   Allergies   Allergen Reactions   ??? Thymoglobulin [Anti-Thymocyte Glob (Rabbit)]      Serum sickness rxn       Medications:   Current Outpatient Prescriptions   Medication Sig Dispense Refill   ??? amLODIPine (NORVASC) 10 MG tablet Take 1 tablet (10 mg total) by mouth daily. 90 tablet 3   ??? aspirin (ADULT LOW DOSE ASPIRIN) 81 MG tablet Take 1 tablet (81 mg total) by mouth daily. 30 tablet 3   ??? atorvastatin (LIPITOR) 10 MG tablet Take 1 tablet (10 mg total) by mouth nightly. 30 tablet 11   ??? carvedilol (COREG) 25 MG tablet TAKE 1 TABLET BY MOUTH TWICE DAILY 60 tablet 11   ??? CELLCEPT 500 mg tablet Take 2 tablets (1,000 mg total) by mouth every twelve (12) hours. Z94.0; trans 360 tablet 3   ??? cloNIDine HCl (CATAPRES) 0.2 MG tablet TAKE 1 TABLET BY MOUTH TWICE DAILY 60 tablet 11   ??? ergocalciferol (VITAMIN D2) 50,000 unit capsule Take 1 capsule (50,000 Units total) by mouth once a week. 12 capsule 3   ??? furosemide (LASIX) 40 MG tablet Pt advised to take 40 mg daily and 80 mg every other day- PRN 30 tablet 11   ??? glimepiride (AMARYL) 4 MG tablet Take 4 mg by mouth every morning before breakfast.     ??? insulin glargine (LANTUS) 100 unit/mL injection Inject 0.3 mL (30 Units total) under the skin nightly. 9 mL 0   ??? insulin lispro (HUMALOG) 100 unit/mL injection Inject 0.03 mL (3 Units total) under the skin Three (3) times a day with a meal. (Patient taking differently: Inject 10 Units under the skin Three (3) times a day with a meal. ) 3 mL 0   ??? magnesium oxide (MAGOX) 400 mg tablet Take 1 tablet (400 mg total) by mouth daily. 200 tablet 1   ??? pioglitazone (ACTOS) 30 MG tablet Take 30 mg by mouth daily.     ??? predniSONE (DELTASONE) 5 MG tablet Take one tablet daily. 90 tablet 3   ??? sodium bicarbonate 650 mg tablet Take 2 tablets (1,300 mg total) by mouth Two (2) times a day. 120 tablet 11   ??? tacrolimus (PROGRAF) 0.5 MG capsule Take 2 capsules (1 mg total) by mouth Two (2) times a day. Z94.0 kidney transplant date 02/03/17; 120 capsule 11   ??? acetaminophen (TYLENOL) 325 MG tablet Take 2 tablets (650 mg total) by mouth every six (6) hours as needed. (Patient not taking: Reported on 01/16/2018) 60 tablet 0   ??? citalopram (CELEXA) 40 MG tablet Take 1 tablet (40 mg total) by mouth daily. (Patient not taking: Reported on 01/02/2018) 30 tablet 11   ??? flash glucose sensor kit by Other route every fourteen (14) days.     ??? pen needle, diabetic (BD ULTRA-FINE NANO PEN NEEDLES) 32 gauge x 5/32 Ndle 1 each by Miscellaneous route Four (  4) times a day. 100 each 3   ??? sulfamethoxazole-trimethoprim (BACTRIM,SEPTRA) 400-80 mg per tablet Take 1 tablet (80 mg of trimethoprim total) by mouth 3 (three) times a week. (Patient not taking: Reported on 01/02/2018) 30 tablet 3   ??? valGANciclovir (VALCYTE) 450 mg tablet Take 1 tablet (450 mg total) by mouth Two (2) times a day. (Patient not taking: Reported on 01/02/2018) 60 tablet 11     No current facility-administered medications for this encounter.        PMH:   Past Medical History:   Diagnosis Date   ??? CVA (cerebral infarction) 2006    Cerebrovasular disease with watershed distribution right frontal/occipital CVA   ??? Diabetes mellitus (CMS-HCC)    ??? Hyperlipidemia    ??? Hypertension    ??? Kidney failure    ??? OSA (obstructive sleep apnea)    ??? Snoring    ??? Snoring    ??? Stroke (CMS-HCC)    ??? TIA (transient ischemic attack)        ASA Grade: ASA 2 - Patient with mild systemic disease with no functional limitations    ROS:  General: Denies fever or chills.  Cardiovascular: Denies chest pain.   Pulmonary: Denies shortness of breath, snoring, sleep apnea, or respiratory infection.    Allergies:   Allergies   Allergen Reactions   ??? Thymoglobulin [Anti-Thymocyte Glob (Rabbit)]      Serum sickness rxn           PE:    Vitals:    01/16/18 0813   BP: 173/106   Pulse: 69   Resp: 18   Temp: 36.6 ??C   SpO2: 96%     General:  white male in NAD.  Airway assessment: Class 3 - Can visualize soft palate  Cardiovascular:  Regular rate and rhythm.  No murmurs, gallops or rubs.   Lungs: respirations nonlabored; clear to auscultation bilaterally

## 2018-01-16 NOTE — Unmapped (Signed)
2nd pass 2nd core biopsy

## 2018-01-16 NOTE — Unmapped (Signed)
Procedure complete.

## 2018-01-16 NOTE — Unmapped (Signed)
1st pass 1st core biopsy taken

## 2018-01-16 NOTE — Unmapped (Signed)
KIDNEY BIOPSY PROCEDURE NOTE    INDICATIONS:  Proteinuria and Assess for rejection     CONSENT/TIME OUT:    Risks, benefits and alternatives including blood loss requiring transfusion, loss of kidney or kidney function, and death were discussed with patient.  Written informed consent was obtained prior to the procedure and is detailed in the medical record.  Prior to the start of the procedure, a time out was taken and the identity of the patient was confirmed via name, medical record number and date of birth.  The availability of the correct equipment was verified.    PROCEDURE:  Name: Transplant Kidney Biopsy  Description: Ultrasound guided transplant kidney biopsy     Pre-Procedure blood specimens were sent for CBC, PT/aPTT, and type & screen.     The patient was given 1 mg of midazolam and 25 mcg of fentanyl during the procedure, and 25 mL lidocaine 1% were used for local anesthesia. Under ultrasound guidance, a 16cm 16G biopsy needle was inserted into the transplanted kidney for a total of 4 passes with 2 core specimens obtained for analysis.      COMPLICATIONS:   Complications: None; no hematoma or active bleeding visualized    The patient will be closely watched in the recovery area, kept flat for 6 hours while wearing an abdominal binder, and will have a repeat CBC and ultrasound in 4 hours to insure no hemodynamically significant bleeding post-biopsy.    SPECIMEN(S):  Core #1: 0.9 x 0.1 x 0.1 (LM 0.9)  Core #2: 1.4 x 0.1 x 0.1 (IF 0.3, EM 0.2, LM 1.9)

## 2018-01-16 NOTE — Unmapped (Addendum)
US Renal Transplant Biopsy Note  Date of Scheduled Biopsy: 01/16/18  Rush?: Yes  Patient Name: Joshua Merritt  MR: [id number]  Age: 37 y.o.  Gender: Male  Race: Caucasian  Procedures: Ultrasound Guided Percutaneous Transplant Kidney Biopsy under Moderate Sedation  Tissue Submitted: Kidney  Special Studies Required: LM, IF, EM  ----------------------------------------------------------------------------------------------------------------------  Date of allograft implantation: 02/03/2007  Underlying native kidney disease: Hypertensive kidney disease  Was the diagnosis established by biopsy? no   Previous transplant biopsies? yes   If yes, what were the previous diagnoses?   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   Moderate to severe interstitial fibrosis and tubular atrophy.  - Severe focal stenosing arteriolosclerosis, suggestive of structural calcineurin inhibitor toxicity.    History/Clinical Diagnosis/Indication for Biopsy: Proteinuria and new DSA  ----------------------------------------------------------------------------------------------------------------------  Current Baseline Immunosuppression: Steroids, MMF/Cellcept and FK506/Tacrolimus  Specific anti-rejection treatment before biopsy: no   If yes, what was the type of treatment? None  Patient off immunosuppression?: no   Patient seems compliant? yes   Patient is currently back on hemodialysis? no   ----------------------------------------------------------------------------------------------------------------------  Evidence of allo-antibodies? yes   If yes, HLA type/MFI?: DQB/MFI: 13469  Blood Pressure (mmHg):   BP Readings from Last 3 Encounters:   01/16/18 173/106   01/02/18 138/74   08/27/17 132/80     Urinalysis:   Lab Results   Component Value Date    COLORU Yellow 01/02/2018 COLORU Light-Yellow 01/18/2015    SPECGRAV 1.011 01/02/2018    SPECGRAV 1.010 01/18/2015    PHUR 6.5 01/02/2018    PHUR 6.5 01/18/2015    GLUCOSEU 70 mg/dL (A) 81/19/1478    GLUCOSEU +1 01/18/2015    KETONESU Negative 01/02/2018    KETONESU NEGATIVE 01/18/2015    BLOODU Trace (A) 01/02/2018    BLOODU TRACE 01/18/2015    NITRITE Negative 01/02/2018    NITRITE NEGATIVE 01/18/2015    LEUKOCYTESUR Negative 01/02/2018    LEUKOCYTESUR NEGATIVE 01/18/2015    UROBILINOGEN 0.2 mg/dL 29/56/2130    UROBILINOGEN 1 01/18/2015    BILIRUBINUR Negative 01/02/2018    BILIRUBINUR NEGATIVE 01/18/2015    WBCU <1 01/18/2015    RBCU 1 01/18/2015     Urine protein/creatinine ratio: 4.2  Creatinine (present peak): 2.2  Creatinine (baseline): 2-2.5  Lab Results   Component Value Date    CREATININE 1.81 (H) 01/16/2018    CREATININE 2.21 (H) 01/02/2018    CREATININE 1.95 (H) 12/16/2017    CREATININE 2.01 (H) 11/27/2017    CREATININE 1.90 (H) 10/22/2017     ----------------------------------------------------------------------------------------------------------------------  Clinical signs of infections at time of current biopsy:  BK: no  BK blood viral load: none detected    CMV: no  CMV viral load: < 50  Herpes: no   Hepatitis B: no   Hepatitis C: no   Bacteria: no   Fungi: no   Urinary tract infection: no   Other: None  ----------------------------------------------------------------------------------------------------------------------  Stenosis of renal artery: no   Obstruction of ureter: no (ureteral stricture previously)  Lymphocele: no   ----------------------------------------------------------------------------------------------------------------------  Donor Information (if available)  Age of donor: NA  Gender: Male  Race: Caucasian  Type of donor: Living related    4210 SURGICAL PATHOLOGY REQUEST FORM  DATE      [facility] Test#  CPT Code Description   4250  [phone]  FROZEN SECTION - SINGLE  4251  16109  FROZEN SECTIO - ADDITIONAL 4227  88300  GROSS ONLY (NO SECTION)   4228  88302  LEVEL II - GROSS & MICRO EXAM   4229  88304  LEVEL III - GROSS & MICRO EXAM   4230  88305  LEVEL IV - GROSS & MICRO EXAM   4231  88307  LEVEL V - GROSS & MICRO EXAM   4232  88309  LEVEL VI - GROSS & MICRO EXAM   4233  88311  DECALCIFICATION   4234  88321  CONSULT ON REFERRED SLIDES   4235  88323  CONSULT WITH SLIDE PREP   4236  88329  CONSULT DURING SURGERY   4237  [phone]  BONE MARROW ASPIRATION     DEPT. USE ONLY     CODE QTY MISCELLANEOUS (SPECIFY) AMOUNT

## 2018-01-16 NOTE — Unmapped (Signed)
3rd pass . Biopsy taken

## 2018-01-17 LAB — BASIC METABOLIC PANEL
ANION GAP: 11 mmol/L (ref 9–15)
BLOOD UREA NITROGEN: 23 mg/dL — ABNORMAL HIGH (ref 7–21)
BUN / CREAT RATIO: 9
BUN / CREAT RATIO: 12
CALCIUM: 9.5 mg/dL (ref 8.5–10.2)
CALCIUM: 9.6 mg/dL (ref 8.5–10.2)
CHLORIDE: 109 mmol/L — ABNORMAL HIGH (ref 98–107)
CHLORIDE: 104 mmol/L (ref 98–107)
CO2: 22 mmol/L (ref 22.0–30.0)
CO2: 21 mmol/L — ABNORMAL LOW (ref 22.0–30.0)
CREATININE: 1.93 mg/dL — ABNORMAL HIGH (ref 0.70–1.30)
EGFR MDRD AF AMER: 54 mL/min/{1.73_m2} — ABNORMAL LOW (ref >=60)
EGFR MDRD AF AMER: 48 mL/min/{1.73_m2} — ABNORMAL LOW (ref >=60)
EGFR MDRD NON AF AMER: 44 mL/min/{1.73_m2} — ABNORMAL LOW (ref >=60)
EGFR MDRD NON AF AMER: 40 mL/min/{1.73_m2} — ABNORMAL LOW (ref >=60)
GLUCOSE RANDOM: 187 mg/dL — ABNORMAL HIGH (ref 65–179)
GLUCOSE RANDOM: 348 mg/dL — ABNORMAL HIGH (ref 65–179)
POTASSIUM: 4.3 mmol/L (ref 3.5–5.0)
SODIUM: 140 mmol/L (ref 135–145)
SODIUM: 136 mmol/L (ref 135–145)

## 2018-01-17 LAB — TACROLIMUS, TROUGH: Lab: 6.1

## 2018-01-17 LAB — CHLORIDE
Chloride:SCnc:Pt:Ser/Plas:Qn:: 104
Chloride:SCnc:Pt:Ser/Plas:Qn:: 109 — ABNORMAL HIGH

## 2018-01-17 NOTE — Unmapped (Signed)
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 *      Ja Ohman is a 37 y.o. male with PMH of HTN, HLD, CVA, living donor transplant on 02/03/2007 secondary to hypertension, polysubstance abuse who presented to [facility] with Antibody mediated rejection of kidney transplant.     *Antibody mediated rejection of kidney transplant, underwent renal biopsy today that was concerning for mild rejection.   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  *HTN  - continue home amlodipine 10 mg daily, Coreg 25 mg BID, Clonidine 0.2 mg BID  - continue home statin  - hold aspirin post biopsy    *DM, recent A1c was 8.7  - continue home lantus 30 u nightly  - SSI  - Lispro 10 units with each meal  - hold home glimepiride  - hold home pioglitazone  ??      FEN/GI: Regular diet, No IV Fluids and Replete lytes as needed    DVT prophylaxis: Ambulation    Code Status: Full Code    DISPOSITION LIST:  [ ]  Anticipated Discharge Location: Home  [ ]  PT/OT/DME: No needs anticipated  [ ]  CM/SW needs: None anticipated  [ ]  Meds/Rx:  Not yet prescribed. No special med needs  [ ]  Teaching: None anticipated  [ ]  Follow up appt: nephrology will follow up  [ ]  Excuse letter: None anticipated  [ ]  Transport: Private Needed      Admitting Provider Signature:  Patrica Duel, NP  APP   Division of [facility] Medicine  January 16, 2018 5:58 PM ______________________________________________________________________  Chief Complaint: Antibody mediated rejection of kidney transplant      History of Present Illness:     Mrk Buzby is a 37 y.o. male with PMH of HTN, HLD, CVA, living donor transplant on 02/03/2007 secondary to hypertension, polysubstance abuse who presented to [facility] with Antibody mediated rejection of kidney transplant.    The patient has had a complicated posttransplant course.  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  He has also had several episodes of antibody and cellular mediated rejection. His last episode of rejection was March of 2018 when underwent 6 sessions of plasmapheresis, thymoglobulin for 7 days and received two doses of rituximab.     He underwent a renal transplant biopsy today and the preliminary pathology is concerning for mild rejection. Procedure went well and without complication. He was called to the observation unit as a direct admission for IV steroids. He is feeling well, but is tearful and upset. He is worried about his kids because he says normally when he is in the hospital for this it is for three weeks.         Allergies:   Thymoglobulin [anti-thymocyte glob (rabbit)]  Medications:     Prior to Admission medications    Medication Dose, Route, Frequency   acetaminophen (TYLENOL) 325 MG tablet 650 mg, Oral, Every 6 hours PRN  Patient not taking: Reported on 01/16/2018   amLODIPine (NORVASC) 10 MG tablet 10 mg, Oral, Daily (standard)   aspirin (ADULT LOW DOSE ASPIRIN) 81 MG tablet 81 mg, Oral, Daily (standard)   atorvastatin (LIPITOR) 10 MG tablet 10 mg, Oral, Nightly   carvedilol (COREG) 25 MG tablet TAKE 1 TABLET BY MOUTH TWICE DAILY   CELLCEPT 500 mg tablet 1,000 mg, Oral, Every 12 hours scheduled, Z94.0; trans   citalopram (CELEXA) 40 MG tablet 40 mg, Oral, Daily (standard)  Patient not taking: Reported on 01/02/2018   cloNIDine HCl (CATAPRES) 0.2 MG tablet TAKE 1 TABLET BY MOUTH TWICE DAILY   ergocalciferol (VITAMIN D2) 50,000 unit capsule 50,000 Units, Oral, Weekly   flash glucose sensor kit Other, Every 14 days   furosemide (LASIX) 40 MG tablet Pt advised to take 40 mg daily and 80 mg every other day- PRN   glimepiride (AMARYL) 4 MG tablet 4 mg, Oral, Daily   insulin glargine (LANTUS) 100 unit/mL injection 30 Units, Subcutaneous, Nightly   insulin lispro (HUMALOG) 100 unit/mL injection 3 Units, Subcutaneous, 3 times a day (with meals)  Patient taking differently: Inject 10 Units under the skin Three (3) times a day with a meal.    magnesium oxide (MAGOX) 400 mg tablet 400 mg, Oral, Daily (standard)   pen needle, diabetic (BD ULTRA-FINE NANO PEN NEEDLES) 32 gauge x 5/32 Ndle 1 each, Miscellaneous, 4 times a day   pioglitazone (ACTOS) 30 MG tablet 30 mg, Oral, Daily (standard)   predniSONE (DELTASONE) 5 MG tablet Take one tablet daily.   sodium bicarbonate 650 mg tablet 1,300 mg, Oral, 2 times a day (standard)   sulfamethoxazole-trimethoprim (BACTRIM,SEPTRA) 400-80 mg per tablet 1 tablet, Oral, 3 times weekly  Patient not taking: Reported on 01/02/2018   tacrolimus (PROGRAF) 0.5 MG capsule 1 mg, Oral, 2 times a day (standard), Z94.0 kidney transplant date 02/03/17;   valGANciclovir (VALCYTE) 450 mg tablet 450 mg, Oral, 2 times a day (standard)  Patient not taking: Reported on 01/02/2018         Past Medical History:     Past Medical History:   Diagnosis Date   ??? CVA (cerebral infarction) 2006    Cerebrovasular disease with watershed distribution right frontal/occipital CVA   ??? Diabetes mellitus (CMS-HCC)    ??? Hyperlipidemia    ??? Hypertension    ??? Kidney failure    ??? OSA (obstructive sleep apnea)    ??? Snoring    ??? Snoring    ??? Stroke (CMS-HCC)    ??? TIA (transient ischemic attack)          Past Surgical History:      Past Surgical History:   Procedure Laterality Date   ??? NEPHRECTOMY TRANSPLANTED ORGAN Right 2008   ??? URETERAL STENT PLACEMENT           Social History:     Social History     Social History   ??? Marital status: Single     Spouse name: N/A   ??? Number of children: N/A   ??? Years of education: N/A     Occupational History   ??? Not on file.     Social History Main Topics   ??? Smoking status: Former Smoker     Packs/day: 0.50     Years: 10.00  Types: Cigarettes     Quit date: 03/05/2017   ??? Smokeless tobacco: Never Used      Comment: 1 cigarette daily   ??? Alcohol use No      Comment: History of heavy alcohol use (2-3 40oz beers daily). Stopped on Jan. 3rd 2017.    ??? Drug use: Yes     Types: Marijuana      Comment: last use of cocaine was 12/31/2015   ??? Sexual activity: Yes     Partners: Female     Other Topics Concern   ??? Not on file     Social History Narrative    [facility] Previously lived with his girlfriend and 4 kids.    Address [place], [place], State): [place], [place], [place]    Guardian/Payee: None        Family Contact:      Outpatient Providers:  ,      Relationship Status: Single     Children: Yes; live with girlfriend    Education: Scientist, product/process development college    Income/Employment/Disability: Stage manager Service: No    Abuse/Neglect/Trauma: none. Informant: the patient     Domestic Violence: No. Informant: the patient     Exposure/Witness to Violence: None    Protective Services Involvement: None    Current/Prior Legal: None    Physical Aggression/Violence: None      Access to Firearms: None     Gang Involvement: None             Family History:     Family History   Problem Relation Age of Onset   ??? Lung cancer Maternal Grandfather    ??? Parkinsonism Paternal Grandfather          Review of Systems:   10 systems reviewed and are negative unless otherwise mentioned in HPI      Physical Exam:   Temp:  [36.6 ??C (97.9 ??F)] 36.6 ??C (97.9 ??F)  Heart Rate:  [60-87] 87  Resp:  [16-28] 18  BP: (143-196)/(72-108) 196/91  SpO2:  [94 %-100 %] 98 %    GENERAL: Obese male, crying and distressed, but well appearing   EYES: Sclerae clear  ENT:  OP w/o lesions NECK: Supple  CARDIOVASCULAR:  Regular rate and rhythm, normal S1/S2, no appreciable murmurs. Trace edema.  RESPIRATORY:  Clear to auscultation bilaterally. No wheezes, crackles, or rhonchi. Normal work of breathing.  ABDOMEN/GI:  Soft, non-tender, non-distended with normoactive bowel sounds. No bleeding from biopsy site.  MSK: No effusions  NEUROLOGIC: motor exam grossly non-focal.  PSYCH: Anxious           Labs/Studies/Diagnostics:   Labs and Studies from the last 24hrs per EMR and Reviewed  Previous labs and studies available in EPIC Reviewed.

## 2018-01-17 NOTE — Unmapped (Signed)
Problem: Patient Care Overview  Goal: Plan of Care Review  Outcome: Progressing   01/16/18 1900   OTHER   Plan of Care Reviewed With patient   Plan of Care Review   Progress improving     Goal: Individualization and Mutuality  Outcome: Progressing    Goal: Discharge Needs Assessment  Outcome: Progressing   01/16/18 1900   OTHER   Equipment Currently Used at Home none   Discharge Needs Assessment   Concerns to be Addressed no discharge needs identified   Readmission Within the Last 30 Days no previous admission in last 30 days   Patient/Family Anticipates Transition to home   Patient/Family Anticipated Services at Transition none   Transportation Anticipated none   Anticipated Changes Related to Illness none   Equipment Needed After Discharge none     Goal: Interprofessional Rounds/Family Conf  Outcome: Progressing   01/16/18 1900   Interdisciplinary Rounds/Family Conf   Participants nursing;physician

## 2018-01-17 NOTE — Unmapped (Signed)
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 *      Joshua Merritt is a 37 y.o. male with a h/o HTN, HLD, CVA, polysubstance abuse, and living donor transplant on 02/03/2007 2/2 HTN who presented to [facility] with Antibody mediated rejection of kidney transplant.    *Antibody mediated rejection of kidney transplant, underwent renal biopsy on 1/18 that was concerning for mild rejection.   - Solumedrol 500 mg IV daily x 3 days (1/18, 1/19, 1/20)  - hold home Prednisone  - nephrology consulting   - daily BMP  ??  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  *HTN  - continue home amlodipine 10 mg daily, Coreg 25 mg BID, Clonidine 0.2 mg BID  - continue home statin  - hold aspirin post biopsy  ??  *DM, recent A1c was 8.7, insulin needs currently increased 2/2 solumedrol.   - continue home lantus 30 u nightly  - SSI: increased 1/19 to resistant sliding scale, plan to adjust basal insulin as indicated  - Lispro 10 units with each meal  - hold home glimepiride 4mg   - hold home pioglitazone 30mg   ??  ??  FEN/GI: Regular diet, No IV Fluids and Replete lytes as needed  ??  DVT prophylaxis: Ambulation  ??  Code Status: Full Code  ??    ______________________________________________________________________    Subjective:   No acute events overnight, Tolerating diet without difficulty, reports that he does not have the charger for his Freestyle blood glucose meter that can be scanned to check blood sugar(will ask it to be brought from home). He slept well last night and looks forward to seeing nephrology.    General ROS: all other systems reviewed are negative    Labs/Studies/Diagnostics:   Labs and Studies from the last 24hrs per EMR and Reviewed    Objective:   BP 159/82  - Pulse 79  - Temp 36.4 ??C (97.5 ??F) (Oral)  - Resp 20  - SpO2 98%     General: well-appearing obese male in no acute distress, seated in bedside chair  HEENT: [place]/AT  CV: RRR, normal S1/S2, no murmurs  Pulm: CTAB, no wheezes/rales, normal WOB  Abd: NABS, soft  Extremities: WWP  Skin: no rashes or lesions evident  Neuro: alert and oriented to clinical encounter, no focal deficits  Psych: pleasant, cooperative, appropriate mood/affect

## 2018-01-17 NOTE — Unmapped (Signed)
Problem: Patient Care Overview  Goal: Plan of Care Review  Outcome: Progressing      Problem: VTE, DVT and PE (Adult)  Goal: Signs and Symptoms of Listed Potential Problems Will be Absent, Minimized or Managed (VTE, DVT and PE)  Signs and symptoms of listed potential problems will be absent, minimized or managed by discharge/transition of care (reference VTE, DVT and PE (Adult) CPG).  Outcome: Progressing  Activity, ambulation and fluids encouraged    Problem: Diabetes, Type 2 (Adult)  Goal: Signs and Symptoms of Listed Potential Problems Will be Absent, Minimized or Managed (Diabetes, Type 2)  Signs and symptoms of listed potential problems will be absent, minimized or managed by discharge/transition of care (reference Diabetes, Type 2 (Adult) CPG).  Outcome: Progressing  Ongoing blood glucose monitoring. Scheduled doses of Lantus and Humalog (meal coverage) as well as SSI PRN. Blood glucose level within established parameters this evening with no SSI coverage required.

## 2018-01-17 NOTE — Unmapped (Signed)
Treatment Plan:   Notified by the Nephropathology dept about the prelim biopsy result:  - shows mild-moderate grade cellular rejection and tubulitis; no arteritis or glomerulitis seen    Recommend admission to the hospital for a short 3-day course of IV Steroids (Solumedrol 500 mg)    Have discussed with the patient, who is anxious about it but agrees to stay.    Have spoken to the MAO who will facilitate with admission.     Have also discussed with the Obs attending on call. Appreciate help.

## 2018-01-17 NOTE — Unmapped (Signed)
Care Management  Initial Transition Planning Assessment              General  Care Manager assessed the patient by : In person interview with patient, In person interview with family  Orientation Level: Oriented X4  Who provides care at home?: N/A (Patient reports living alone and being independent at baseline. Patient staes he's able to drive and has reliable transportation home and to his local pharmacy on the day of discharge.)    Contact/Decision Maker:    Contact Details  Contact Details: Primary Contact  Primary Contact Name: Benny Lennert- Girlfriend and designated HCPOA  Primary Contact Relationship: Significant Other  Phone #1: [phone]    Advance Directive (Medical Treatment)  Does patient have an advance directive covering medical treatment?: Patient does not have advance directive covering medical treatment., Patient would not like information.  Reason patient does not have an advance directive covering medical treatment:: Patient does not wish to complete one at this time  Surrogate decision maker appointed:: No  Reason there is not a surrogate decision maker appointed:: Patient does not wish to appoint a surrogate decision maker at this time  Surrogate decision maker's name:: Heather Woodard-Girlfriend  Surrogate decision maker's phone number:: [phone]  Information provided on advance directive:: No  Patient requests assistance:: No    Advance Directive (Mental Health Treatment)  Does patient have an advance directive covering mental health treatment?: Patient does not have advance directive covering mental health treatment., Patient would not like information.  Reason patient does not have an advance directive covering mental health treatment:: Patient does not wish to complete one at this time.    Patient Information:    Lives with: Alone (Patient states he lives alone and his girfriend and kids live down the street from him)    Type of Residence: Private residence  Location/Detail: [address] [place], [place] [address]; [phone]     Type of Residence: Mailing Address:  [address]  [place] [address]  Contacts: Contact Details: Primary Contact  Primary Contact Name: Benny Lennert- Girlfriend and designated HCPOA  Primary Contact Relationship: Significant Other  Phone #1: [phone]  Patient Phone Number: [phone]        Medical Provider(s): Debbe Mounts, MD  Reason for Admission: Admitting Diagnosis:  No admission diagnoses are documented for this encounter.  Past Medical History:   has a past medical history of CVA (cerebral infarction) (2006); Diabetes mellitus (CMS-HCC); Hyperlipidemia; Hypertension; Kidney failure; OSA (obstructive sleep apnea); Snoring; Snoring; Stroke (CMS-HCC); and TIA (transient ischemic attack).  Past Surgical History:   has a past surgical history that includes Nephrectomy transplanted organ (Right, 2008) and Ureteral stent placement.   Previous admit date: 08/07/2017    Primary Insurance- Payor: MEDICARE / Plan: MEDICARE PART A AND PART B / Product Type: *No Product type* /   Secondary Insurance ??? Secondary Insurance  MEDICAID [place]  Prescription Coverage ???   Preferred Pharmacy - [facility] - [place], [place] - [address]  [id number] - [place], [place] - [address] AT [facility] OF [address] & [facility]  [facility] - [place], [place] - [address]  [facility] [phone] - [place], [place] - [address] AT [facility] OF [address] & [address]  [facility] [phone] - [place], [place] - [address]  [id number] - [place], [place] - [address] AT [facility]  Transportation home: Private vehicle     Level of function prior to admission: Independent    Support Systems: Children, Significant Other, Family Members    Responsibilities/Dependents at home?: No    Home Care services in place prior to admission?: No    Equipment Currently Used at Home: none    Currently receiving outpatient dialysis?: No (Patient reports his kidney transplant was 2008)    Financial Information:     Need for financial assistance?: No    Discharge Needs Assessment:    Concerns to be Addressed: no discharge needs identified    Clinical Risk Factors: Multiple Diagnoses (Chronic)    Barriers to taking medications: No    Prior overnight hospital stay or ED visit in last 90 days: No    Readmission Within the Last 30 Days: no previous admission in last 30 days    Anticipated Changes Related to Illness: none    Equipment Needed After Discharge: none    Discharge Facility/Level of Care Needs:  (Plan is for patient to return home)    Patient at risk for readmission?: N/A    Discharge Plan:    Screen findings are: Care Manager reviewed the plan of the patient's care with the Multidisciplinary Team. No discharge planning needs identified at this time. Care Manager will continue to manage plan and monitor patient's progress with the team.    Expected Discharge Date: 01/18/18    Expected Transfer from Critical Care: N/A    Patient and/or family were provided with choice of facilities / services that are available and appropriate to meet post hospital care needs?: N/A    Initial Assessment complete?: Yes

## 2018-01-18 LAB — BASIC METABOLIC PANEL
ANION GAP: 9 mmol/L (ref 9–15)
BLOOD UREA NITROGEN: 32 mg/dL — ABNORMAL HIGH (ref 7–21)
BUN / CREAT RATIO: 16
CALCIUM: 9.7 mg/dL (ref 8.5–10.2)
CHLORIDE: 108 mmol/L — ABNORMAL HIGH (ref 98–107)
CO2: 23 mmol/L (ref 22.0–30.0)
CREATININE: 1.98 mg/dL — ABNORMAL HIGH (ref 0.70–1.30)
EGFR MDRD AF AMER: 47 mL/min/{1.73_m2} — ABNORMAL LOW (ref >=60)
EGFR MDRD NON AF AMER: 39 mL/min/{1.73_m2} — ABNORMAL LOW (ref >=60)
POTASSIUM: 4.1 mmol/L (ref 3.5–5.0)
SODIUM: 140 mmol/L (ref 135–145)

## 2018-01-18 LAB — CHLORIDE: Chloride:SCnc:Pt:Ser/Plas:Qn:: 108 — ABNORMAL HIGH

## 2018-01-18 MED ORDER — OMEPRAZOLE 20 MG CAPSULE,DELAYED RELEASE
ORAL_CAPSULE | Freq: Every day | ORAL | 0 refills | 0.00000 days
Start: 2018-01-18 — End: 2018-01-20

## 2018-01-18 MED ORDER — INSULIN LISPRO (U-100) 100 UNIT/ML SUBCUTANEOUS SOLUTION
Freq: Three times a day (TID) | SUBCUTANEOUS | 0 refills | 0.00000 days | Status: SS
Start: 2018-01-18 — End: 2018-03-04

## 2018-01-18 MED ORDER — INSULIN GLARGINE (U-100) 100 UNIT/ML SUBCUTANEOUS SOLUTION
Freq: Every evening | SUBCUTANEOUS | 0 refills | 0 days | Status: SS
Start: 2018-01-18 — End: 2018-03-04

## 2018-01-18 NOTE — Unmapped (Signed)
Joshua Merritt is a 37 y.o. male with PMH of HTN, HLD, CVA, polysubstance abuse, living donor transplant on 02/03/2007 secondary to hypertension who presented to [facility] with Antibody mediated rejection of kidney transplant.  ??  The patient has had a complicated posttransplant course. 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 ??He has also had several episodes of antibody and cellular mediated rejection. His last episode of rejection was March of 2018 when underwent 6 sessions of plasmapheresis, thymoglobulin for 7 days and received two doses of rituximab.   ??  He underwent a renal transplant biopsy on 01/16/18 and preliminary pathology is concerning for mild rejection. The procedure went well and without complication. He was called to the observation unit as a direct admission for IV steroids.     He did well in the hospital and received three doses of Solumedrol 500mg  IV (1/18, 1/19, 1/20) as planned. He was continued on home Tacrolimus and Cellcept. With regards to his diabetes, his home Lantus 30U nightly was continued. Home glimepiride and pioglitazone were held. He was placed on Lispro 10U mealtime (increased from home 3U mealtime) and will be discharged on his home regimen (blood sugar range while inpatient was 114-405, hyperglycemia controlled with additional insulin resistant sliding scale dosing). He follows AC/HS blood sugars at home.

## 2018-01-18 NOTE — Unmapped (Signed)
Problem: Patient Care Overview  Goal: Plan of Care Review  Outcome: Progressing   01/17/18 1639   OTHER   Plan of Care Reviewed With patient   Plan of Care Review   Progress improving     Goal: Individualization and Mutuality  Outcome: Progressing    Goal: Discharge Needs Assessment  Outcome: Progressing   01/17/18 1639   OTHER   Equipment Currently Used at Home none   Discharge Needs Assessment   Concerns to be Addressed no discharge needs identified   Readmission Within the Last 30 Days no previous admission in last 30 days   Patient/Family Anticipates Transition to home   Patient/Family Anticipated Services at Transition none   Transportation Anticipated none   Anticipated Changes Related to Illness none   Equipment Needed After Discharge none     Goal: Interprofessional Rounds/Family Conf  Outcome: Progressing   01/17/18 1639   Interdisciplinary Rounds/Family Conf   Participants nursing;physician       Problem: VTE, DVT and PE (Adult)  Goal: Signs and Symptoms of Listed Potential Problems Will be Absent, Minimized or Managed (VTE, DVT and PE)  Signs and symptoms of listed potential problems will be absent, minimized or managed by discharge/transition of care (reference VTE, DVT and PE (Adult) CPG).   Outcome: Progressing   01/17/18 1639   VTE, DVT and PE (Adult)   Problems Assessed (VTE, DVT, PE) all   Problems Present (VTE, DVT, PE) none       Problem: Diabetes, Type 2 (Adult)  Goal: Signs and Symptoms of Listed Potential Problems Will be Absent, Minimized or Managed (Diabetes, Type 2)  Signs and symptoms of listed potential problems will be absent, minimized or managed by discharge/transition of care (reference Diabetes, Type 2 (Adult) CPG).   Outcome: Progressing   01/17/18 1639   Diabetes, Type 2 (Adult)   Problems Assessed (Type 2 Diabetes) all   Problems Present (Type 2 Diabetes) none

## 2018-01-18 NOTE — Unmapped (Signed)
Problem: Patient Care Overview  Goal: Plan of Care Review  Outcome: Progressing    Goal: Individualization and Mutuality  Outcome: Progressing    Goal: Discharge Needs Assessment  Outcome: Progressing    Goal: Interprofessional Rounds/Family Conf  Outcome: Progressing      Problem: VTE, DVT and PE (Adult)  Goal: Signs and Symptoms of Listed Potential Problems Will be Absent, Minimized or Managed (VTE, DVT and PE)  Signs and symptoms of listed potential problems will be absent, minimized or managed by discharge/transition of care (reference VTE, DVT and PE (Adult) CPG).   Outcome: Progressing      Problem: Diabetes, Type 2 (Adult)  Goal: Signs and Symptoms of Listed Potential Problems Will be Absent, Minimized or Managed (Diabetes, Type 2)  Signs and symptoms of listed potential problems will be absent, minimized or managed by discharge/transition of care (reference Diabetes, Type 2 (Adult) CPG).   Outcome: Progressing

## 2018-01-18 NOTE — Unmapped (Signed)
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 *      Outpatient Provider Follow Up Issues:  [ ]  Follow up blood sugar control  [ ]  Follow up kidney biopsy results    Hospital Course:    Joshua Merritt??is a 37 y.o.??male??with PMH??of HTN, HLD, CVA, polysubstance abuse, living donor transplant on 02/03/2007 secondary to hypertension??who presented to [facility] with??Antibody mediated rejection of kidney transplant.  ??  The patient has had a complicated posttransplant course. 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 ??He has also had several episodes of antibody and cellular mediated rejection. His last episode of rejection was March of 2018 when underwent 6 sessions of plasmapheresis, thymoglobulin for 7 days and received two doses??of rituximab.   ??  He underwent a renal transplant biopsy on 01/16/18 and preliminary pathology is concerning for mild rejection. The procedure went well and without complication. He was called to the observation unit as a direct admission for IV steroids.   ??  He did well in the hospital and received three doses of Solumedrol 500mg  IV (1/18, 1/19, 1/20) as planned. He was continued on home Tacrolimus and Cellcept. With regards to his diabetes, his home Lantus 30U nightly was continued. Home glimepiride and pioglitazone were held. He was continued on his home Lispro 10U mealtime as well as insulin resistant sliding scale (received additional 36U Lispro to cover steroid-induced hyperglycemia). He will be discharged on his home regimen (blood sugar range while inpatient was 114-405). He follows AC/HS blood sugars at home. His other home medications were all continued without adjustment.    Procedures:  None  US RENAL (LIMITED) [[id number]]    ______________________________________________________________________  Discharge Day Services:  BP 152/79  - Pulse 67  - Temp 36.5 ??C (97.7 ??F) (Oral)  - Resp 18  - Ht 180.3 cm (5' 11)  - Wt (!) 156 kg (343 lb 14.7 oz)  - SpO2 96%  - BMI 47.97 kg/m??   Pt seen on the day of discharge and determined appropriate for discharge.    Condition at Discharge: good  ______________________________________________________________________  Discharge Medications:     Your Medication List      START taking these medications    omeprazole 20 MG capsule  Commonly known as:  PriLOSEC  Take 1 capsule (20 mg total) by mouth daily.        CONTINUE taking these medications    amLODIPine 10 MG tablet  Commonly known as:  NORVASC  Take 1 tablet (10 mg total) by mouth daily.     aspirin 81 MG tablet  Commonly known as:  ADULT LOW DOSE ASPIRIN  Take 1 tablet (81 mg total) by mouth daily.     atorvastatin 10 MG tablet  Commonly known as:  LIPITOR  Take 1 tablet (10 mg total) by mouth nightly.     carvedilol 25 MG tablet  Commonly known as:  COREG  TAKE 1 TABLET  BY MOUTH TWICE DAILY     CELLCEPT 500 mg tablet  Generic drug:  mycophenolate  Take 2 tablets (1,000 mg total) by mouth every twelve (12) hours. Z94.0; trans     citalopram 40 MG tablet  Commonly known as:  CeleXA  Take 1 tablet (40 mg total) by mouth daily.     cloNIDine HCl 0.2 MG tablet  Commonly known as:  CATAPRES  TAKE 1 TABLET BY MOUTH TWICE DAILY     ergocalciferol 50,000 unit capsule  Commonly known as:  VITAMIN D2  Take 1 capsule (50,000 Units total) by mouth once a week.     flash glucose sensor kit  by Other route every fourteen (14) days.     furosemide 40 MG tablet  Commonly known as:  LASIX  Pt advised to take 40 mg daily and 80 mg every other day- PRN     glimepiride 4 MG tablet  Commonly known as:  AMARYL  Take 4 mg by mouth every morning before breakfast.     insulin glargine 100 unit/mL injection  Commonly known as:  LANTUS  Inject 0.3 mL (30 Units total) under the skin nightly.     insulin lispro 100 unit/mL injection  Commonly known as:  HumaLOG  Inject 0.1 mL (10 Units total) under the skin Three (3) times a day with a meal.     magnesium oxide 400 mg (241.3 mg magnesium) tablet  Commonly known as:  MAGOX  Take 1 tablet (400 mg total) by mouth daily.     pen needle, diabetic 32 gauge x 5/32 Ndle  Commonly known as:  BD ULTRA-FINE NANO PEN NEEDLE  1 each by Miscellaneous route Four (4) times a day.     pioglitazone 30 MG tablet  Commonly known as:  ACTOS  Take 30 mg by mouth daily.     predniSONE 5 MG tablet  Commonly known as:  DELTASONE  Take one tablet daily.     sodium bicarbonate 650 mg tablet  Take 2 tablets (1,300 mg total) by mouth Two (2) times a day.     tacrolimus 0.5 MG capsule  Commonly known as:  PROGRAF  Take 2 capsules (1 mg total) by mouth Two (2) times a day. Z94.0 kidney transplant date 02/03/17;          ______________________________________________________________________  Pending Test Results (if blank, then none):      Most Recent Labs:  Microbiology Results (last day)     ** No results found for the last 24 hours. **          Lab Results   Component Value Date    WBC 6.2 01/16/2018    HGB 10.3 (L) 01/16/2018    HCT 32.0 (L) 01/16/2018    PLT 200 01/16/2018       Lab Results   Component Value Date    NA 140 01/18/2018    K 4.1 01/18/2018    CL 108 (H) 01/18/2018    CO2 23.0 01/18/2018    BUN 32 (H) 01/18/2018    CREATININE 1.98 (H) 01/18/2018    CALCIUM 9.7 01/18/2018    MG 1.6 01/02/2018    PHOS 4.1 01/02/2018       Lab Results   Component Value Date    ALKPHOS 90 01/02/2018    BILITOT 0.4 01/02/2018    BILIDIR 0.30 01/03/2016    PROT 5.6 (L) 01/02/2018    ALBUMIN 3.6 01/02/2018    ALT 18 (L) 01/02/2018  AST 20 01/02/2018    GGT 30 01/04/2015       Lab Results   Component Value Date    PT 11.5 01/16/2018    INR 1.01 01/16/2018    APTT 28.9 01/16/2018     Hospital Radiology:  US Renal (limited)    Result Date: 01/16/2018  EXAM: US RENAL (LIMITED) DATE: 01/16/2018 2:49 PM ACCESSION: [id number] UN DICTATED: 01/16/2018 2:50 PM INTERPRETATION LOCATION: [facility] CLINICAL INDICATION: 37 years old Male with postbiopsy--  COMPARISON: Renal biopsy ultrasound same day. Renal ultrasound 08/08/2017. TECHNIQUE: Static and cine images of the kidneys and bladder were performed. FINDINGS: KIDNEYS: No hematoma or other complication identified status post same day renal biopsy. No hydronephrosis. No additional significant interval change. BLADDER: Unremarkable.      -- No same-day renal biopsy complication identified.      US Renal Biopsy    Result Date: 01/16/2018  EXAM: US RENAL BIOPSY DATE: 01/16/2018 10:42 AM ACCESSION: [id number] UN DICTATED: 01/16/2018 10:44 AM INTERPRETATION LOCATION: [facility] CLINICAL INDICATION: 37 years old Male with -N17.9-AKI (acute kidney injury) (CMS-HCC) TECHNIQUE: Ultrasound, retroperitoneal, real time with image documentation, limited.  Ultrasonic guidance for needle placement, imaging supervision and interpretation. COMPARISON: Renal chest 08/08/2017, RENAL BIOPSY ULTRASOUND 05/09/2017 BIOPSY PROCEDURE:  Images of the right lower quadrant transplant kidney were obtained in transverse and longitudinal planes. No contraindication to biopsy was seen. No overlying bowel or vessel was seen along the planned biopsy route. Using sterile technique, the lower pole of the RLQ transplant kidney was localized for Dr. Ruffin Frederick of the Nephrology service. 4 passes were made using a spring loaded biopsy needle under real time sonographic guidance.  No obvious post-biopsy hydronephrosis, hematoma or active bleeding was demonstrated with gray scale or color doppler following the completion of the biopsy.  A sonographer on the Radiology staff assisted with visualization of the kidney.  No radiologist was present during the procedure.      -Real time sonographic guidance for renal biopsy. No complications seen.        ______________________________________________________________________    Discharge Instructions:  Activity Instructions     Activity as tolerated                 Other Instructions     Discharge instructions to patient: Call your primary care doctor and make an appointment to see them:       Within 2 weeks from the time you are discharged from the hospital               Follow Up instructions and Outpatient Referrals     Call MD for:  persistent nausea or vomiting       Call MD for:  severe uncontrolled pain       Call MD for:  temperature >38.5 Celsius       Discharge instructions       You were in the hospital for steroid treatment for mild rejection. You were treated with additional insulin coverage due to the higher blood sugars during the steroid treatment. Please resume home diabetes medications and monitoring and contact your doctor if you have concerns about ongoing high blood sugar.               Appointments which have been scheduled for you    Apr 29, 2018  8:30 AM EDT  (Arrive by 8:15 AM)  LAB ONLY with LAB WALK-IN ACC  [facility] ([facility]) [address]  [address]  [place] [address]  [phone]   Apr 29, 2018  9:00 AM EDT  (Arrive by 8:45 AM)  RETURN NEPHROLOGY POST with Brayton Caves, MD  [facility] [address] [facility]) [address]  [place] [address]  [phone]          Length of Discharge: I spent less than 30 mins in the discharge of this patient.

## 2018-01-18 NOTE — Unmapped (Signed)
Problem: Patient Care Overview  Goal: Plan of Care Review  Outcome: Progressing   01/18/18 0040   OTHER   Plan of Care Reviewed With patient   Plan of Care Review   Progress improving     Goal: Discharge Needs Assessment  Outcome: Progressing   01/18/18 0040   OTHER   Equipment Currently Used at Home none   Patient and/or family were provided with choice of facilities / services that are available and appropriate to meet post hospital care needs? N/A   Discharge Needs Assessment   Concerns to be Addressed no discharge needs identified;denies needs/concerns at this time   Readmission Within the Last 30 Days no previous admission in last 30 days   Patient/Family Anticipates Transition to home   Patient/Family Anticipated Services at Transition none   Transportation Anticipated none   Anticipated Changes Related to Illness none   Equipment Needed After Discharge none   Discharge Facility/Level of Care Needs other (see comments)  (DC to home)   Comfortable at this time, for one more dose of solumedrol next shift. No complaints at this time    Problem: VTE, DVT and PE (Adult)  Goal: Signs and Symptoms of Listed Potential Problems Will be Absent, Minimized or Managed (VTE, DVT and PE)  Signs and symptoms of listed potential problems will be absent, minimized or managed by discharge/transition of care (reference VTE, DVT and PE (Adult) CPG).   Outcome: Progressing   01/18/18 0040   VTE, DVT and PE (Adult)   Problems Assessed (VTE, DVT, PE) all   Problems Present (VTE, DVT, PE) none   Ankle pumps and circles encouraged when in bed, ambulates without any difficulty.    Problem: Diabetes, Type 2 (Adult)  Goal: Signs and Symptoms of Listed Potential Problems Will be Absent, Minimized or Managed (Diabetes, Type 2)  Signs and symptoms of listed potential problems will be absent, minimized or managed by discharge/transition of care (reference Diabetes, Type 2 (Adult) CPG).   Outcome: Progressing   01/18/18 0040   Diabetes, Type 2 (Adult)   Problems Assessed (Type 2 Diabetes) all   Problems Present (Type 2 Diabetes) none   Continue to monitor Blood glucose, Lantus and Lispro insulin given as ordered.

## 2018-01-20 MED ORDER — OMEPRAZOLE 20 MG CAPSULE,DELAYED RELEASE
ORAL_CAPSULE | 11 refills | 0 days | Status: CP
Start: 2018-01-20 — End: 2018-02-09

## 2018-01-20 NOTE — Unmapped (Signed)
Patient request for RX refill.

## 2018-01-21 MED ORDER — PREDNISONE 5 MG TABLET
ORAL_TABLET | 3 refills | 0 days | Status: CP
Start: 2018-01-21 — End: 2019-02-17

## 2018-01-23 MED ORDER — SULFAMETHOXAZOLE 400 MG-TRIMETHOPRIM 80 MG TABLET
ORAL_TABLET | Freq: Two times a day (BID) | ORAL | 0 refills | 0 days | Status: CP
Start: 2018-01-23 — End: 2018-01-28

## 2018-01-24 ENCOUNTER — Ambulatory Visit: Admit: 2018-01-24 | Discharge: 2018-01-24 | Disposition: A | Discharge status: Home / Self Care | Payer: MEDICARE

## 2018-01-24 NOTE — Unmapped (Signed)
Wound dressed at this time. DC instructions discussed. Pt verbalizes understanding.

## 2018-01-24 NOTE — Unmapped (Signed)
rec'd call from ED dept. Pt there with new c/o and noted swelling to L leg,  erythema. Pt is afebrile. They plan to I /D site and send home on Bactrim. Transplant coord called to inform of this visit and for f/u care. Pt had just been discharged about 4 days ago.    Primary TNC notified via this routed message.

## 2018-01-24 NOTE — Unmapped (Signed)
Patient with hx of kidney transplant presents with fever, flu like sx, and possible abscess since Tuesday. Max temp 102. Abscess since Tuesday as well. Afebrile in triage. Has been on steroids d/t slight kidney rejection

## 2018-01-24 NOTE — Unmapped (Signed)
[facility]   Emergency Department Provider Note    ED Final Clinical Impression     Final diagnoses:   Abscess of left thigh (Primary)         ED Course, Assessment and Plan     Initial Clinical Impression:    Joshua Merritt is a 37 y.o. male with PMH of HTN, HLD, CVA, polysubstance abuse, and kidney transplant (2008) presenting to the ED for abscess to left thigh. Questionable chills at home but no documented fevers and afebrile here. He has a 2cm area of swelling and fluctance to left medial thigh with overlying erythema consistent with abscess.    Plan for I&D. Will give Clonidine and Carvedilol as patient has not taken his nightly medications and is significantly hypertensive on arrival. Will discuss with transplant coordinator on call.     ED Course:    10:40 PM I spoke with transplant coordinator. They are agreeable to plan. Will proceed with I&D.     10:40 PM Patient tolerated I&D well with no immediate complications. Will d/c with Bactrim and f/u with transplant coordinator. Strict return precautions were discussed. Patient expresses understanding and is agreeable to the plan.     I have discussed the case with the Attending Physician, Dr. Dimple Casey who was in agreement with the plan as described above.  ____________________________________________    Time seen: January 23, 2018 9:24 PM     History     CHIEF COMPLAINT:   Chief Complaint   Patient presents with   ??? Fever Immunocompromised       HPI:   Joshua Merritt is a 37 y.o. male with PMH of HTN, HLD, CVA, polysubstance abuse, and kidney transplant (2008) presenting to the ED for evaluation of fever and chills x3 days. Per chart review, patient was recently admitted from 01/16/18-01/18/18 for antibody mediated rejection of kidney transplant. A renal transplant biopsy was completed on 01/16/18 with preliminary pathology concerning for mild rejection. He received IV steroids and continued on home Tacrolimus and Cellcept before being discharged. Today patient reports developing a persistent subjective, tactile fever and chills 3 days ago. He has not had a thermometer to measure his temperature. He also reports developing a painful, swollen, erythematous area to the left upper thigh that is consistent with previous similar symptoms. He states these previous symptoms usually self resolve or resolve with abx. Denies chest pain, shortness of breath, nausea, vomiting, diarrhea, urinary symptoms, or any other concerning symptoms.        ____________________________________________    PAST MEDICAL HISTORY/PAST SURGICAL HISTORY:   Past Medical History:   Diagnosis Date   ??? CVA (cerebral infarction) 2006    Cerebrovasular disease with watershed distribution right frontal/occipital CVA   ??? Diabetes mellitus (CMS-HCC)    ??? Hyperlipidemia    ??? Hypertension    ??? Kidney failure    ??? OSA (obstructive sleep apnea)    ??? Snoring    ??? Snoring    ??? Stroke (CMS-HCC)    ??? TIA (transient ischemic attack)        MEDICATIONS:   No current facility-administered medications for this encounter.     Current Outpatient Prescriptions:   ???  amLODIPine (NORVASC) 10 MG tablet, Take 1 tablet (10 mg total) by mouth daily., Disp: 90 tablet, Rfl: 3  ???  aspirin (ADULT LOW DOSE ASPIRIN) 81 MG tablet, Take 1 tablet (81 mg total) by mouth daily., Disp: 30 tablet, Rfl: 3  ???  atorvastatin (LIPITOR)  10 MG tablet, Take 1 tablet (10 mg total) by mouth nightly., Disp: 30 tablet, Rfl: 11  ???  carvedilol (COREG) 25 MG tablet, TAKE 1 TABLET BY MOUTH TWICE DAILY, Disp: 60 tablet, Rfl: 11  ???  CELLCEPT 500 mg tablet, Take 2 tablets (1,000 mg total) by mouth every twelve (12) hours. Z94.0; trans, Disp: 360 tablet, Rfl: 3  ???  citalopram (CELEXA) 40 MG tablet, Take 1 tablet (40 mg total) by mouth daily. (Patient not taking: Reported on 01/02/2018), Disp: 30 tablet, Rfl: 11  ???  cloNIDine HCl (CATAPRES) 0.2 MG tablet, TAKE 1 TABLET BY MOUTH TWICE DAILY, Disp: 60 tablet, Rfl: 11  ???  ergocalciferol (VITAMIN D2) 50,000 unit capsule, Take 1 capsule (50,000 Units total) by mouth once a week., Disp: 12 capsule, Rfl: 3  ???  flash glucose sensor kit, by Other route every fourteen (14) days., Disp: , Rfl:   ???  furosemide (LASIX) 40 MG tablet, Pt advised to take 40 mg daily and 80 mg every other day- PRN, Disp: 30 tablet, Rfl: 11  ???  glimepiride (AMARYL) 4 MG tablet, Take 4 mg by mouth every morning before breakfast., Disp: , Rfl:   ???  insulin glargine (LANTUS) 100 unit/mL injection, Inject 0.3 mL (30 Units total) under the skin nightly., Disp: 9 mL, Rfl: 0  ???  insulin lispro (HUMALOG) 100 unit/mL injection, Inject 0.1 mL (10 Units total) under the skin Three (3) times a day with a meal., Disp: 900 Units, Rfl: 0  ???  magnesium oxide (MAGOX) 400 mg tablet, Take 1 tablet (400 mg total) by mouth daily., Disp: 200 tablet, Rfl: 1  ???  omeprazole (PRILOSEC) 20 MG capsule, TAKE 1 CAPSULE BY MOUTH DAILY., Disp: 30 capsule, Rfl: 11  ???  pen needle, diabetic (BD ULTRA-FINE NANO PEN NEEDLES) 32 gauge x 5/32 Ndle, 1 each by Miscellaneous route Four (4) times a day., Disp: 100 each, Rfl: 3  ???  pioglitazone (ACTOS) 30 MG tablet, Take 30 mg by mouth daily., Disp: , Rfl:   ???  predniSONE (DELTASONE) 5 MG tablet, Take one tablet daily., Disp: 90 tablet, Rfl: 3  ???  sodium bicarbonate 650 mg tablet, Take 2 tablets (1,300 mg total) by mouth Two (2) times a day., Disp: 120 tablet, Rfl: 11  ???  sulfamethoxazole-trimethoprim (BACTRIM) 400-80 mg per tablet, Take 1 tablet (80 mg of trimethoprim total) by mouth Two (2) times a day. for 5 days, Disp: 10 tablet, Rfl: 0  ???  tacrolimus (PROGRAF) 0.5 MG capsule, Take 2 capsules (1 mg total) by mouth Two (2) times a day. Z94.0 kidney transplant date 02/03/17;, Disp: 120 capsule, Rfl: 11    ALLERGIES:   Thymoglobulin [anti-thymocyte glob (rabbit)]    SOCIAL HISTORY:   Social History   Substance Use Topics   ??? Smoking status: Former Smoker     Packs/day: 0.50     Years: 10.00     Types: Cigarettes     Quit date: 03/05/2017   ??? Smokeless tobacco: Never Used      Comment: 1 cigarette daily   ??? Alcohol use No      Comment: History of heavy alcohol use (2-3 40oz beers daily). Stopped on Jan. 3rd 2017.        FAMILY HISTORY:  Family History   Problem Relation Age of Onset   ??? Lung cancer Maternal Grandfather    ??? Parkinsonism Paternal Grandfather         ____________________________________________    Review of  Systems  Constitutional: Positive for subjective, tactile fevers and chills.  Cardiovascular: Negative for chest pain.  Respiratory: Negative for shortness of breath.  A 10 point review of systems was performed and is negative other than positive elements noted in HPI     Physical Exam     VITAL SIGNS:    BP 214/106 Comment: has not taken nightly medication  - Pulse 92  - Temp 36.6 ??C (97.9 ??F)  - Resp 20  - SpO2 97%     Constitutional: Alert and oriented. Well appearing and in no distress.  Head: Normocephalic and atraumatic  Eyes: EOMI, PERRL, conjunctiva normal  ENT: External ears normal, bilateral nares clear, MMM  Cardiovascular: Normal rate, regular rhythm. Normal and symmetric distal pulses are present in all extremities.  Respiratory: Normal respiratory effort. Breath sounds are normal.  Gastrointestinal: Soft, non-distended and nontender. There is no CVA tenderness.  Musculoskeletal: Nontender with normal range of motion in all extremities.       Right lower leg: No tenderness or edema.       Left lower leg: No tenderness or edema.  Neurologic: Normal speech and language. No gross focal neurologic deficits are appreciated.  Skin: 2cm area of swelling and fluctance to left medial thigh with overlying erythema.  Psychiatric: Mood and affect are normal. Speech and behavior are normal.    Incision/Drainage  Date/Time: 01/23/2018 10:02 PM  Performed by: Marcelyn Bruins  Authorized by: Marcelyn Bruins     Consent:     Consent obtained:  Verbal    Consent given by:  Patient    Risks discussed:  Bleeding, incomplete drainage, pain and infection  Location:     Type:  Abscess    Size:  2 cm    Location:  Lower extremity    Lower extremity location:  Leg    Leg location:  L upper leg  Pre-procedure details:     Skin preparation:  Chloraprep  Anesthesia (see MAR for exact dosages):     Anesthesia method:  Local infiltration    Local anesthetic:  Lidocaine 1% w/o epi  Procedure type:     Complexity:  Complex  Procedure details:     Incision types:  Single straight    Scalpel blade:  11    Wound management:  Probed and deloculated    Drainage:  Bloody and purulent    Drainage amount:  Moderate    Wound treatment:  Wound left open    Packing materials:  1/2 in iodoform gauze    Amount 1/2 iodoform:  2 inches  Post-procedure details:     Patient tolerance of procedure:  Tolerated well, no immediate complications                  Pertinent labs & imaging results that were available during my care of the patient were reviewed by me and considered in my medical decision making. Labs and radiology studies included in my note may not constitute all ordered/reviewied labs during this encounter (see chart for details).    Portions of this record have been created using Scientist, clinical (histocompatibility and immunogenetics). Dictation errors have been sought, but may not have been identified and corrected.    Documentation assistance was provided by Shelby Dubin, Scribe, on January 23, 2018 at 9:24 PM for Francina Ames, MD.    Resident Attestation: January 23, 2018 10:45 PM. Documentation assistance was provided by the scribe in my presence.  The documentation recorded by the scribe has been reviewed  by me and accurately reflects the services I personally performed.        Karel Jarvis, MD  Resident  01/23/18 [id number]

## 2018-01-24 NOTE — Unmapped (Addendum)
Introduced self to patient, pt respirations unlabored and regular at this time. Pt a&ox4, endorses pain to L inner thigh, denies needs and is resting in bed. Pt was recently hospitalized for kidney biopsy for concern for mild rejection. Pt kidney transplant recipient back in 2007. On Tuesday, pt developed a boil on his L inner upper thigh that has progressively gotten worse. +swelling, redness, warmth, tenderness. Pt endorses subjective fevers at home. Personal belongings within reach. Call bell within reach. Bed in locked and in lowest position. Will continue to monitor.

## 2018-02-02 NOTE — Unmapped (Signed)
Notified to get Biopsy and agreed to come in.

## 2018-02-03 ENCOUNTER — Ambulatory Visit: Admit: 2018-02-03 | Discharge: 2018-02-03 | Payer: MEDICARE | Attending: Nephrology | Primary: Nephrology

## 2018-02-03 ENCOUNTER — Other Ambulatory Visit: Admit: 2018-02-03 | Discharge: 2018-02-03 | Payer: MEDICARE

## 2018-02-03 DIAGNOSIS — Z940 Kidney transplant status: Principal | ICD-10-CM

## 2018-02-03 DIAGNOSIS — Z0000 Encounter for general adult medical examination without abnormal findings: Secondary | ICD-10-CM

## 2018-02-03 DIAGNOSIS — R509 Fever, unspecified: Principal | ICD-10-CM

## 2018-02-03 DIAGNOSIS — D899 Disorder involving the immune mechanism, unspecified: Secondary | ICD-10-CM

## 2018-02-03 LAB — CBC W/ AUTO DIFF
BASOPHILS ABSOLUTE COUNT: 0 10*9/L (ref 0.0–0.1)
EOSINOPHILS ABSOLUTE COUNT: 0.1 10*9/L (ref 0.0–0.4)
HEMATOCRIT: 31.2 % — ABNORMAL LOW (ref 41.0–53.0)
LARGE UNSTAINED CELLS: 1 % (ref 0–4)
LYMPHOCYTES ABSOLUTE COUNT: 0.5 10*9/L — ABNORMAL LOW (ref 1.5–5.0)
MEAN CORPUSCULAR HEMOGLOBIN CONC: 30.7 g/dL — ABNORMAL LOW (ref 31.0–37.0)
MEAN CORPUSCULAR HEMOGLOBIN: 24.9 pg — ABNORMAL LOW (ref 26.0–34.0)
MEAN CORPUSCULAR VOLUME: 81.1 fL (ref 80.0–100.0)
MEAN PLATELET VOLUME: 7.7 fL (ref 7.0–10.0)
MONOCYTES ABSOLUTE COUNT: 0.7 10*9/L (ref 0.2–0.8)
NEUTROPHILS ABSOLUTE COUNT: 9.1 10*9/L — ABNORMAL HIGH (ref 2.0–7.5)
PLATELET COUNT: 185 10*9/L (ref 150–440)
RED BLOOD CELL COUNT: 3.84 10*12/L — ABNORMAL LOW (ref 4.50–5.90)
RED CELL DISTRIBUTION WIDTH: 15.9 % — ABNORMAL HIGH (ref 12.0–15.0)
WBC ADJUSTED: 10.5 10*9/L (ref 4.5–11.0)

## 2018-02-03 LAB — COMPREHENSIVE METABOLIC PANEL
ALBUMIN: 3 g/dL — ABNORMAL LOW (ref 3.5–5.0)
ALKALINE PHOSPHATASE: 69 U/L (ref 38–126)
ALT (SGPT): 25 U/L (ref 19–72)
ANION GAP: 5 mmol/L — ABNORMAL LOW (ref 9–15)
AST (SGOT): 13 U/L — ABNORMAL LOW (ref 19–55)
BILIRUBIN TOTAL: 0.5 mg/dL (ref 0.0–1.2)
BLOOD UREA NITROGEN: 22 mg/dL — ABNORMAL HIGH (ref 7–21)
BUN / CREAT RATIO: 11
CALCIUM: 8.5 mg/dL (ref 8.5–10.2)
CO2: 22 mmol/L (ref 22.0–30.0)
EGFR MDRD AF AMER: 45 mL/min/{1.73_m2} — ABNORMAL LOW (ref >=60)
EGFR MDRD NON AF AMER: 37 mL/min/{1.73_m2} — ABNORMAL LOW (ref >=60)
GLUCOSE RANDOM: 156 mg/dL — ABNORMAL HIGH (ref 65–99)
POTASSIUM: 4 mmol/L (ref 3.5–5.0)
PROTEIN TOTAL: 4.7 g/dL — ABNORMAL LOW (ref 6.5–8.3)
SODIUM: 136 mmol/L (ref 135–145)

## 2018-02-03 LAB — TACROLIMUS, TROUGH: Lab: 3.7

## 2018-02-03 LAB — MEAN CORPUSCULAR HEMOGLOBIN: Lab: 24.9 — ABNORMAL LOW

## 2018-02-03 LAB — URINALYSIS
BILIRUBIN UA: NEGATIVE
GLUCOSE UA: 70 — AB
KETONES UA: NEGATIVE
NITRITE UA: NEGATIVE
PH UA: 6.5 (ref 5.0–9.0)
PROTEIN UA: 100 — AB
RBC UA: 18 /HPF — ABNORMAL HIGH (ref <3)
SPECIFIC GRAVITY UA: 1.008 (ref 1.003–1.030)
SQUAMOUS EPITHELIAL: 1 /HPF (ref 0–5)
UROBILINOGEN UA: 0.2

## 2018-02-03 LAB — PARATHYROID HOMONE (PTH): CALCIUM: 8.5 mg/dL (ref 8.5–10.2)

## 2018-02-03 LAB — CREATININE, URINE: Lab: 56.4

## 2018-02-03 LAB — MAGNESIUM: Magnesium:MCnc:Pt:Ser/Plas:Qn:: 1.4 — ABNORMAL LOW

## 2018-02-03 LAB — VITAMIN D, TOTAL (25OH): Lab: 21.6

## 2018-02-03 LAB — BACTERIA

## 2018-02-03 LAB — PHOSPHORUS: Phosphate:MCnc:Pt:Ser/Plas:Qn:: 3

## 2018-02-03 LAB — SMEAR REVIEW

## 2018-02-03 LAB — PROTEIN / CREATININE RATIO, URINE
PROTEIN URINE: 332 mg/dL
PROTEIN/CREAT RATIO, URINE: 5.887

## 2018-02-03 LAB — PROTEIN TOTAL: Protein:MCnc:Pt:Ser/Plas:Qn:: 4.7 — ABNORMAL LOW

## 2018-02-03 LAB — CALCIUM: Calcium:MCnc:Pt:Ser/Plas:Qn:: 8.5

## 2018-02-03 MED ORDER — LOSARTAN 25 MG TABLET
ORAL_TABLET | Freq: Every day | ORAL | 11 refills | 0 days | Status: CP
Start: 2018-02-03 — End: 2018-08-25

## 2018-02-03 MED ORDER — OSELTAMIVIR 75 MG CAPSULE
ORAL_CAPSULE | Freq: Two times a day (BID) | ORAL | 0 refills | 0.00000 days | Status: CP
Start: 2018-02-03 — End: 2018-02-08

## 2018-02-04 LAB — CMV DNA, QUANTITATIVE, PCR: CMV QUANT: 116 [IU]/mL — ABNORMAL HIGH (ref <0)

## 2018-02-04 LAB — CMV QUANT: Lab: 116 — ABNORMAL HIGH

## 2018-02-04 MED ORDER — TACROLIMUS 0.5 MG CAPSULE
ORAL_CAPSULE | ORAL | 11 refills | 0.00000 days | Status: CP
Start: 2018-02-04 — End: 2018-02-04

## 2018-02-04 MED ORDER — TACROLIMUS 0.5 MG CAPSULE: capsule | 11 refills | 0 days | Status: AC

## 2018-02-04 NOTE — Unmapped (Signed)
Pt advised to increase prograf 3 mg in the  Morning and 2 mg at night

## 2018-02-06 LAB — HLA DS POST TRANSPLANT
ANTI-DONOR DRW #1 MFI: 0 MFI
ANTI-DONOR DRW #2 MFI: 66 MFI
ANTI-DONOR HLA-A #2 MFI: 0 MFI
ANTI-DONOR HLA-B #1 MFI: 49 MFI
ANTI-DONOR HLA-B #2 MFI: 0 MFI
ANTI-DONOR HLA-C #2 MFI: 0 MFI
ANTI-DONOR HLA-DQB #1 MFI: 0 MFI
ANTI-DONOR HLA-DQB #2 MFI: 16483 MFI — ABNORMAL HIGH
ANTI-DONOR HLA-DR #1 MFI: 406 MFI
ANTI-DONOR HLA-DR #2 MFI: 0 MFI
DONOR DRW ANTIGEN #1: 53
DONOR DRW ANTIGEN #2: 52
DONOR HLA-A ANTIGEN #1: 24
DONOR HLA-A ANTIGEN #2: 30
DONOR HLA-B ANTIGEN #1: 35
DONOR HLA-B ANTIGEN #2: 39
DONOR HLA-C ANTIGEN #1: 4
DONOR HLA-C ANTIGEN #2: 7
DONOR HLA-DQB ANTIGEN #1: 8
DONOR HLA-DQB ANTIGEN #2: 7
DONOR HLA-DR ANTIGEN #1: 4
DONOR HLA-DR ANTIGEN #2: 11

## 2018-02-06 LAB — ANTI-DONOR HLA-DR #1 MFI: Lab: 406

## 2018-02-06 LAB — BK VIRUS QUANTITATIVE PCR, BLOOD

## 2018-02-06 LAB — BK BLOOD RESULT: Lab: NOT DETECTED

## 2018-02-09 LAB — FSAB CLASS 2 ANTIBODY SPECIFICITY

## 2018-02-09 LAB — FSAB CLASS 1 ANTIBODY SPECIFICITY: HLA CLASS 1 ANTIBODY RESULT: NEGATIVE

## 2018-02-09 LAB — HLA CL2 AB RESULT: Lab: POSITIVE

## 2018-02-09 LAB — VITAMIN D 1,25-DIHYDROXY: 1,25-Dihydroxyvitamin D:MCnc:Pt:Ser/Plas:Qn:: 31

## 2018-02-09 LAB — CPRA%: Lab: 0

## 2018-02-09 MED ORDER — OMEPRAZOLE 20 MG CAPSULE,DELAYED RELEASE
ORAL_CAPSULE | Freq: Every day | ORAL | 11 refills | 0 days | Status: CP
Start: 2018-02-09 — End: 2018-02-10

## 2018-02-10 MED ORDER — OMEPRAZOLE 20 MG CAPSULE,DELAYED RELEASE
ORAL_CAPSULE | 3 refills | 0 days | Status: CP
Start: 2018-02-10 — End: 2019-02-17

## 2018-02-10 NOTE — Unmapped (Signed)
Patient request for RX refill.

## 2018-02-11 NOTE — Unmapped (Unsigned)
[facility] Specialty Medication Referral: No PA required    Medication (Brand/Generic): TACROLIMUS    Initial FSI Test Claim completed with resulted information below:  No PA required  Patient ABLE to fill at [facility]  Insurance Company:  MEDICARE B  Anticipated Copay: $27.43    As Co-pay is under $100 defined limit, per policy there will be no further investigation of need for financial assistance at this time unless patient requests. This referral has been communicated to the provider and handed off to the [facility] team for further processing and filling of prescribed medication.   ______________________________________________________________________  Please utilize this referral for viewing purposes as it will serve as the central location for all relevant documentation and updates.

## 2018-02-13 LAB — CBC W/ DIFFERENTIAL
BASOPHILS ABSOLUTE COUNT: 0 10*3/uL (ref 0.0–0.2)
BASOPHILS RELATIVE PERCENT: 0 %
EOSINOPHILS ABSOLUTE COUNT: 0.2 10*3/uL (ref 0.0–0.4)
EOSINOPHILS RELATIVE PERCENT: 3 %
HEMOGLOBIN: 9.8 g/dL — ABNORMAL LOW (ref 13.0–17.7)
IMMATURE GRANULOCYTES: 0 %
LYMPHOCYTES ABSOLUTE COUNT: 0.8 10*3/uL (ref 0.7–3.1)
LYMPHOCYTES RELATIVE PERCENT: 13 %
MEAN CORPUSCULAR HEMOGLOBIN: 24.9 pg — ABNORMAL LOW (ref 26.6–33.0)
MEAN CORPUSCULAR VOLUME: 79 fL (ref 79–97)
MONOCYTES ABSOLUTE COUNT: 0.7 10*3/uL (ref 0.1–0.9)
MONOCYTES RELATIVE PERCENT: 12 %
NEUTROPHILS ABSOLUTE COUNT: 4.4 10*3/uL (ref 1.4–7.0)
NEUTROPHILS RELATIVE PERCENT: 72 %
PLATELET COUNT: 353 10*3/uL (ref 150–379)
RED CELL DISTRIBUTION WIDTH: 16.2 % — ABNORMAL HIGH (ref 12.3–15.4)
WHITE BLOOD CELL COUNT: 6.1 10*3/uL (ref 3.4–10.8)

## 2018-02-13 LAB — BASIC METABOLIC PANEL
BUN / CREAT RATIO: 8 — ABNORMAL LOW (ref 9–20)
CALCIUM: 9.3 mg/dL (ref 8.7–10.2)
CO2: 19 mmol/L — ABNORMAL LOW (ref 20–29)
CREATININE: 2.15 mg/dL — ABNORMAL HIGH (ref 0.76–1.27)
GLUCOSE: 113 mg/dL — ABNORMAL HIGH (ref 65–99)
POTASSIUM: 5.4 mmol/L — ABNORMAL HIGH (ref 3.5–5.2)
SODIUM: 147 mmol/L — ABNORMAL HIGH (ref 134–144)

## 2018-02-13 LAB — CREATININE: Lab: 2.15 — ABNORMAL HIGH

## 2018-02-13 LAB — PHOSPHORUS, SERUM: Lab: 3.4

## 2018-02-13 LAB — MAGNESIUM: Lab: 1.7

## 2018-02-13 LAB — NEUTROPHILS RELATIVE PERCENT: Lab: 72

## 2018-02-14 LAB — TACROLIMUS LEVEL: TACROLIMUS BLOOD: 18.6 ng/mL (ref 2.0–20.0)

## 2018-02-14 LAB — TACROLIMUS BLOOD: Lab: 18.6

## 2018-02-16 MED ORDER — PROGRAF 0.5 MG CAPSULE
ORAL_CAPSULE | 11 refills | 0 days | Status: CP
Start: 2018-02-16 — End: 2018-04-07

## 2018-02-16 NOTE — Unmapped (Signed)
[facility] Specialty Medication Referral: No PA required    Medication (Brand/Generic): PROGRAF 0.5MG     Initial FSI Test Claim completed with resulted information below:  No PA required  Patient ABLE to fill at [facility] Company:  MED B  Anticipated Copay: $20.46  *Pt was enrolled in SOT queue, but hasn't gotten any meds from [place] since August 2018    As Co-pay is under $100 defined limit, per policy there will be no further investigation of need for financial assistance at this time unless patient requests. This referral has been communicated to the provider and handed off to the [facility] team for further processing and filling of prescribed medication.   ______________________________________________________________________  Please utilize this referral for viewing purposes as it will serve as the central location for all relevant documentation and updates.

## 2018-02-16 NOTE — Unmapped (Signed)
Prograf 18.6; trying to reach patient to verify if he took his meds before labs.

## 2018-02-16 NOTE — Unmapped (Signed)
Pt said he did not take his dose before going to labs. Advise to hold prograf dose tonight and decrease to 2mg  am and 1 mg PM

## 2018-02-19 ENCOUNTER — Ambulatory Visit: Admit: 2018-02-19 | Discharge: 2018-02-20 | Payer: MEDICARE

## 2018-02-19 DIAGNOSIS — Z940 Kidney transplant status: Principal | ICD-10-CM

## 2018-02-19 DIAGNOSIS — Z0000 Encounter for general adult medical examination without abnormal findings: Secondary | ICD-10-CM

## 2018-02-19 LAB — CBC W/ AUTO DIFF
BASOPHILS ABSOLUTE COUNT: 0 10*9/L (ref 0.0–0.1)
BASOPHILS RELATIVE PERCENT: 0.4 %
EOSINOPHILS ABSOLUTE COUNT: 0.1 10*9/L (ref 0.0–0.4)
EOSINOPHILS RELATIVE PERCENT: 2.2 %
HEMATOCRIT: 32.5 % — ABNORMAL LOW (ref 41.0–53.0)
LARGE UNSTAINED CELLS: 1 % (ref 0–4)
LYMPHOCYTES ABSOLUTE COUNT: 0.6 10*9/L — ABNORMAL LOW (ref 1.5–5.0)
LYMPHOCYTES RELATIVE PERCENT: 10 %
MEAN CORPUSCULAR HEMOGLOBIN CONC: 31.1 g/dL (ref 31.0–37.0)
MEAN CORPUSCULAR HEMOGLOBIN: 24.9 pg — ABNORMAL LOW (ref 26.0–34.0)
MEAN CORPUSCULAR VOLUME: 79.9 fL — ABNORMAL LOW (ref 80.0–100.0)
MEAN PLATELET VOLUME: 8.7 fL (ref 7.0–10.0)
MONOCYTES ABSOLUTE COUNT: 0.5 10*9/L (ref 0.2–0.8)
NEUTROPHILS ABSOLUTE COUNT: 5 10*9/L (ref 2.0–7.5)
PLATELET COUNT: 308 10*9/L (ref 150–440)
RED BLOOD CELL COUNT: 4.07 10*12/L — ABNORMAL LOW (ref 4.50–5.90)
RED CELL DISTRIBUTION WIDTH: 16.5 % — ABNORMAL HIGH (ref 12.0–15.0)
WBC ADJUSTED: 6.3 10*9/L (ref 4.5–11.0)

## 2018-02-19 LAB — COMPREHENSIVE METABOLIC PANEL
ALBUMIN: 3.4 g/dL — ABNORMAL LOW (ref 3.5–5.0)
ALKALINE PHOSPHATASE: 84 U/L (ref 38–126)
ALT (SGPT): 17 U/L — ABNORMAL LOW (ref 19–72)
ANION GAP: 5 mmol/L — ABNORMAL LOW (ref 9–15)
AST (SGOT): 13 U/L — ABNORMAL LOW (ref 19–55)
BILIRUBIN TOTAL: 0.3 mg/dL (ref 0.0–1.2)
BLOOD UREA NITROGEN: 19 mg/dL (ref 7–21)
BUN / CREAT RATIO: 9
CHLORIDE: 113 mmol/L — ABNORMAL HIGH (ref 98–107)
CREATININE: 2.09 mg/dL — ABNORMAL HIGH (ref 0.70–1.30)
EGFR MDRD AF AMER: 44 mL/min/{1.73_m2} — ABNORMAL LOW (ref >=60)
EGFR MDRD NON AF AMER: 36 mL/min/{1.73_m2} — ABNORMAL LOW (ref >=60)
GLUCOSE RANDOM: 72 mg/dL (ref 65–179)
POTASSIUM: 4.4 mmol/L (ref 3.5–5.0)
PROTEIN TOTAL: 5.6 g/dL — ABNORMAL LOW (ref 6.5–8.3)
SODIUM: 143 mmol/L (ref 135–145)

## 2018-02-19 LAB — URINALYSIS
BACTERIA: NONE SEEN /HPF
BILIRUBIN UA: NEGATIVE
GLUCOSE UA: NEGATIVE
KETONES UA: NEGATIVE
NITRITE UA: NEGATIVE
PH UA: 7 (ref 5.0–9.0)
PROTEIN UA: 100 — AB
RBC UA: 5 /HPF — ABNORMAL HIGH (ref <3)
SPECIFIC GRAVITY UA: 1.015 (ref 1.005–1.040)
SQUAMOUS EPITHELIAL: 1 /HPF (ref 0–5)
WBC UA: 25 /HPF — ABNORMAL HIGH (ref <2)

## 2018-02-19 LAB — PARATHYROID HORMONE INTACT: Parathyrin.intact:MCnc:Pt:Ser/Plas:Qn:: 227.8 — ABNORMAL HIGH

## 2018-02-19 LAB — PHOSPHORUS: Phosphate:MCnc:Pt:Ser/Plas:Qn:: 4

## 2018-02-19 LAB — VITAMIN D, TOTAL (25OH): Lab: 25.9

## 2018-02-19 LAB — PROTEIN / CREATININE RATIO, URINE: CREATININE, URINE: 49.8 mg/dL

## 2018-02-19 LAB — ALBUMIN: Albumin:MCnc:Pt:Ser/Plas:Qn:: 3.4 — ABNORMAL LOW

## 2018-02-19 LAB — SPECIFIC GRAVITY UA: Lab: 1.015

## 2018-02-19 LAB — LYMPHOCYTES RELATIVE PERCENT: Lab: 10

## 2018-02-19 LAB — TACROLIMUS, TROUGH: Lab: 8.5

## 2018-02-19 LAB — MAGNESIUM: Magnesium:MCnc:Pt:Ser/Plas:Qn:: 1.6

## 2018-02-19 LAB — VITAMIN D 25 HYDROXY: VITAMIN D, TOTAL (25OH): 25.9 ng/mL (ref 20.0–80.0)

## 2018-02-19 LAB — PROTEIN URINE: Protein:MCnc:Pt:Urine:Qn:: 195

## 2018-02-19 LAB — PARATHYROID HOMONE (PTH): CALCIUM: 9.5 mg/dL (ref 8.5–10.2)

## 2018-02-19 NOTE — Unmapped (Signed)
Spoke with patient today - he states that gets all his meds at [facility] and prefers to keep them there - does not want any meds from [place] at [facility] at this time. I will message coordinator and dis-enroll patient from specialty calls. He is aware to call us with any future needs or concerns.

## 2018-03-01 ENCOUNTER — Ambulatory Visit
Admit: 2018-03-01 | Discharge: 2018-03-04 | Disposition: A | Discharge status: Home / Self Care | Payer: MEDICARE | Admitting: Nephrology

## 2018-03-01 DIAGNOSIS — L03311 Cellulitis of abdominal wall: Principal | ICD-10-CM

## 2018-03-04 MED ORDER — CEPHALEXIN 500 MG CAPSULE
ORAL_CAPSULE | Freq: Four times a day (QID) | ORAL | 0 refills | 0.00000 days | Status: CP
Start: 2018-03-04 — End: 2018-03-14

## 2018-03-04 MED ORDER — INSULIN GLARGINE (U-100) 100 UNIT/ML SUBCUTANEOUS SOLUTION
Freq: Every evening | SUBCUTANEOUS | 0 refills | 0.00000 days
Start: 2018-03-04 — End: 2018-12-29

## 2018-03-04 MED ORDER — INSULIN LISPRO (U-100) 100 UNIT/ML SUBCUTANEOUS SOLUTION
Freq: Three times a day (TID) | SUBCUTANEOUS | 0 refills | 0.00000 days
Start: 2018-03-04 — End: 2019-05-03

## 2018-03-04 MED ORDER — DOXYCYCLINE HYCLATE 100 MG CAPSULE
ORAL_CAPSULE | Freq: Two times a day (BID) | ORAL | 0 refills | 0.00000 days | Status: CP
Start: 2018-03-04 — End: 2018-03-14

## 2018-04-07 MED ORDER — MAGNESIUM OXIDE 400 MG (241.3 MG MAGNESIUM) TABLET
ORAL_TABLET | Freq: Every day | ORAL | 1 refills | 0.00000 days | Status: CP
Start: 2018-04-07 — End: 2018-08-27

## 2018-04-07 MED ORDER — PROGRAF 0.5 MG CAPSULE
ORAL_CAPSULE | 11 refills | 0 days | Status: CP
Start: 2018-04-07 — End: 2018-04-13

## 2018-04-07 MED ORDER — ERGOCALCIFEROL (VITAMIN D2) 1,250 MCG (50,000 UNIT) CAPSULE
ORAL_CAPSULE | ORAL | 3 refills | 0.00000 days | Status: CP
Start: 2018-04-07 — End: 2019-03-28

## 2018-04-07 MED ORDER — AMLODIPINE 10 MG TABLET
ORAL_TABLET | Freq: Every day | ORAL | 3 refills | 0.00000 days | Status: CP
Start: 2018-04-07 — End: 2019-04-21

## 2018-04-13 MED ORDER — TACROLIMUS 1 MG CAPSULE
ORAL_CAPSULE | Freq: Two times a day (BID) | ORAL | 0 refills | 0.00000 days | Status: CP
Start: 2018-04-13 — End: 2018-04-14

## 2018-04-14 MED ORDER — TACROLIMUS 1 MG CAPSULE
ORAL_CAPSULE | Freq: Two times a day (BID) | ORAL | 0 refills | 0.00000 days | Status: CP
Start: 2018-04-14 — End: 2018-05-12

## 2018-04-29 ENCOUNTER — Ambulatory Visit: Admit: 2018-04-29 | Discharge: 2018-04-29 | Payer: MEDICARE | Attending: Nephrology | Primary: Nephrology

## 2018-04-29 ENCOUNTER — Ambulatory Visit: Admit: 2018-04-29 | Discharge: 2018-04-29 | Payer: MEDICARE

## 2018-04-29 DIAGNOSIS — Z940 Kidney transplant status: Principal | ICD-10-CM

## 2018-04-29 DIAGNOSIS — D899 Disorder involving the immune mechanism, unspecified: Secondary | ICD-10-CM

## 2018-04-29 DIAGNOSIS — Z0000 Encounter for general adult medical examination without abnormal findings: Secondary | ICD-10-CM

## 2018-05-12 MED ORDER — TACROLIMUS 1 MG CAPSULE
ORAL_CAPSULE | 2 refills | 0 days | Status: CP
Start: 2018-05-12 — End: 2018-06-11

## 2018-06-07 ENCOUNTER — Encounter: Payer: Self-pay | Admitting: Emergency Medicine

## 2018-06-07 ENCOUNTER — Emergency Department
Admission: EM | Admit: 2018-06-07 | Discharge: 2018-06-08 | Disposition: A | Discharge status: Home / Self Care | Payer: Medicare Other | Attending: Emergency Medicine | Admitting: Emergency Medicine

## 2018-06-07 ENCOUNTER — Other Ambulatory Visit: Payer: Self-pay

## 2018-06-07 DIAGNOSIS — Z79899 Other long term (current) drug therapy: Secondary | ICD-10-CM | POA: Insufficient documentation

## 2018-06-07 DIAGNOSIS — Z794 Long term (current) use of insulin: Secondary | ICD-10-CM | POA: Diagnosis not present

## 2018-06-07 DIAGNOSIS — I10 Essential (primary) hypertension: Secondary | ICD-10-CM | POA: Diagnosis not present

## 2018-06-07 DIAGNOSIS — T383X1A Poisoning by insulin and oral hypoglycemic [antidiabetic] drugs, accidental (unintentional), initial encounter: Secondary | ICD-10-CM | POA: Diagnosis not present

## 2018-06-07 DIAGNOSIS — E118 Type 2 diabetes mellitus with unspecified complications: Secondary | ICD-10-CM

## 2018-06-07 DIAGNOSIS — F17200 Nicotine dependence, unspecified, uncomplicated: Secondary | ICD-10-CM | POA: Insufficient documentation

## 2018-06-07 DIAGNOSIS — E1165 Type 2 diabetes mellitus with hyperglycemia: Secondary | ICD-10-CM | POA: Diagnosis present

## 2018-06-07 LAB — COMPREHENSIVE METABOLIC PANEL
ALBUMIN: 3.8 g/dL (ref 3.5–5.0)
ALT: 17 U/L (ref 17–63)
ANION GAP: 9 (ref 5–15)
AST: 25 U/L (ref 15–41)
Alkaline Phosphatase: 81 U/L (ref 38–126)
BUN: 26 mg/dL — ABNORMAL HIGH (ref 6–20)
CALCIUM: 8.9 mg/dL (ref 8.9–10.3)
CHLORIDE: 105 mmol/L (ref 101–111)
CO2: 21 mmol/L — AB (ref 22–32)
Creatinine, Ser: 2.3 mg/dL — ABNORMAL HIGH (ref 0.61–1.24)
GFR calc non Af Amer: 35 mL/min — ABNORMAL LOW (ref >60)
GFR, EST AFRICAN AMERICAN: 40 mL/min — AB (ref >60)
GLUCOSE: 92 mg/dL (ref 65–99)
POTASSIUM: 4.3 mmol/L (ref 3.5–5.1)
SODIUM: 135 mmol/L (ref 135–145)
Total Bilirubin: 0.5 mg/dL (ref 0.3–1.2)
Total Protein: 6.2 g/dL — ABNORMAL LOW (ref 6.5–8.1)

## 2018-06-07 LAB — GLUCOSE, CAPILLARY
GLUCOSE-CAPILLARY: 97 mg/dL (ref 65–99)
Glucose-Capillary: 121 mg/dL — ABNORMAL HIGH (ref 65–99)
Glucose-Capillary: 126 mg/dL — ABNORMAL HIGH (ref 65–99)
Glucose-Capillary: 56 mg/dL — ABNORMAL LOW (ref 65–99)

## 2018-06-07 LAB — CBC WITH DIFFERENTIAL/PLATELET
BASOS PCT: 0 %
Basophils Absolute: 0 10*3/uL (ref 0–0.1)
Eosinophils Absolute: 0.1 10*3/uL (ref 0–0.7)
Eosinophils Relative: 1 %
HEMATOCRIT: 32.3 % — AB (ref 40.0–52.0)
HEMOGLOBIN: 10.8 g/dL — AB (ref 13.0–18.0)
LYMPHS PCT: 8 %
Lymphs Abs: 0.6 10*3/uL — ABNORMAL LOW (ref 1.0–3.6)
MCH: 26.1 pg (ref 26.0–34.0)
MCHC: 33.3 g/dL (ref 32.0–36.0)
MCV: 78.2 fL — AB (ref 80.0–100.0)
MONO ABS: 0.7 10*3/uL (ref 0.2–1.0)
MONOS PCT: 8 %
NEUTROS ABS: 7.2 10*3/uL — AB (ref 1.4–6.5)
NEUTROS PCT: 83 %
Platelets: 216 10*3/uL (ref 150–440)
RBC: 4.13 MIL/uL — ABNORMAL LOW (ref 4.40–5.90)
RDW: 16 % — ABNORMAL HIGH (ref 11.5–14.5)
WBC: 8.5 10*3/uL (ref 3.8–10.6)

## 2018-06-07 LAB — URINALYSIS, COMPLETE (UACMP) WITH MICROSCOPIC
BACTERIA UA: NONE SEEN
Bilirubin Urine: NEGATIVE
GLUCOSE, UA: NEGATIVE mg/dL
Hgb urine dipstick: NEGATIVE
Ketones, ur: NEGATIVE mg/dL
Leukocytes, UA: NEGATIVE
NITRITE: NEGATIVE
PROTEIN: 100 mg/dL — AB
Specific Gravity, Urine: 1.004 — ABNORMAL LOW (ref 1.005–1.030)
Squamous Epithelial / LPF: NONE SEEN (ref 0–5)
WBC UA: NONE SEEN WBC/hpf (ref 0–5)
pH: 7 (ref 5.0–8.0)

## 2018-06-07 LAB — LIPASE, BLOOD: Lipase: 44 U/L (ref 11–51)

## 2018-06-07 MED ORDER — SODIUM CHLORIDE 0.9 % IV BOLUS
1000.0000 mL | Freq: Once | INTRAVENOUS | Status: AC
  Administered 2018-06-07: 1000 mL via INTRAVENOUS

## 2018-06-07 MED ORDER — CLONIDINE HCL 0.1 MG PO TABS
ORAL_TABLET | ORAL | Status: AC
  Filled 2018-06-07: qty 2

## 2018-06-07 MED ORDER — CARVEDILOL 25 MG PO TABS
ORAL_TABLET | ORAL | Status: AC
  Filled 2018-06-07: qty 1

## 2018-06-07 NOTE — ED Triage Notes (Signed)
Patient states that he is a type 2 diabetic. Patient reports that his blood sugar has been elevated all day. Patient states that his blood sugar has been in the 400's and that it is normally well controlled. Patient states that he has taken extra insulin and has still not been able to get his blood sugar down.

## 2018-06-07 NOTE — ED Notes (Signed)
Pt unable to give urine sample at this time. Pt has specimen cup.

## 2018-06-07 NOTE — ED Notes (Signed)
Pt states his monitor has been reading in the 400's today, he took extra humalog and lantus doses. In triage, pt checked his monitor, it was still reading in the 400's and ED glucometer read 97.

## 2018-06-07 NOTE — ED Notes (Addendum)
Patient here with complaints of elevated glucose. States at home it is staying around 200 despite the use of his Humalog 100 units total today so far. Of note, had kidney transplant at [facility] in 2007

## 2018-06-08 LAB — GLUCOSE, CAPILLARY: GLUCOSE-CAPILLARY: 100 mg/dL — AB (ref 65–99)

## 2018-06-08 MED ORDER — CARVEDILOL PHOSPHATE ER 20 MG PO CP24
20.0000 mg | ORAL_CAPSULE | Freq: Every day | ORAL | Status: DC
  Administered 2018-06-08: 20 mg via ORAL
  Filled 2018-06-08: qty 1

## 2018-06-08 MED ORDER — CLONIDINE HCL 0.1 MG PO TABS
0.2000 mg | ORAL_TABLET | Freq: Once | ORAL | Status: AC
  Administered 2018-06-08: 0.2 mg via ORAL

## 2018-06-08 NOTE — ED Notes (Signed)
Home HTN meds ordered per home dosages for pt d/t PM doses missed.

## 2018-06-08 NOTE — ED Provider Notes (Signed)
[facility] Emergency Department Provider Note  ____________________________________________  Time seen: Approximately 12:46 AM  I have reviewed the triage vital signs and the nursing notes.   HISTORY  Chief Complaint Hyperglycemia    HPI Eric Frey is a 37 y.o. male with a history of hypertension and diabetes who reports that his transcutaneous blood glucose monitor was reporting high blood sugars all day so he kept taking additional doses of insulin.  He took both short acting insulin as well as extra Lantus.  Denies any recent illnesses.  No polyuria or polydipsia no fevers chills or sweats or abdominal pain or vomiting or diarrhea.  Denies loss of consciousness or dizziness or shaking.  No double vision.      Past Medical History:  Diagnosis Date  . Diabetes mellitus without complication ([place])   . Hypertension   . Renal disorder    kidney transplant 2008     There are no active problems to display for this patient.    Past Surgical History:  Procedure Laterality Date  . KIDNEY TRANSPLANT       Prior to Admission medications   Medication Sig Start Date End Date Taking? Authorizing Provider  acetaminophen (TYLENOL) 325 MG tablet Take 650 mg by mouth every 6 (six) hours as needed. 01/18/16   [provider]  amLODipine (NORVASC) 10 MG tablet Take 1 tablet by mouth daily. 04/09/14   [provider]  aspirin EC 81 MG tablet Take 81 mg by mouth daily. 01/18/16   [provider]  atorvastatin (LIPITOR) 10 MG tablet Take 1 tablet by mouth daily. 04/02/14   [provider]  carvedilol (COREG) 12.5 MG tablet Take 25 mg by mouth 2 (two) times daily. 04/01/17   [provider]  Cholecalciferol (VITAMIN D3) 2000 units capsule Take 1 capsule by mouth daily. 02/16/13   [provider]  citalopram (CELEXA) 40 MG tablet Take 1 tablet by mouth daily. 12/09/14   [provider]  glimepiride  (AMARYL) 2 MG tablet Take 2 tablets by mouth daily. 04/19/17   [provider]  insulin lispro (HUMALOG) 100 UNIT/ML KiwkPen Inject 20 Units into the skin 3 (three) times daily with meals. 03/26/17   [provider]  LANTUS SOLOSTAR 100 UNIT/ML Solostar Pen Inject 28 Units into the skin daily. 02/14/17   [provider]  lisinopril (PRINIVIL,ZESTRIL) 10 MG tablet Take 1 tablet by mouth daily. 03/24/17   [provider]  omeprazole (PRILOSEC) 20 MG capsule Take 1 capsule by mouth daily. 03/26/17   [provider]  pioglitazone (ACTOS) 30 MG tablet Take 30 mg by mouth daily.    [provider]  tacrolimus (PROGRAF) 1 MG capsule Take 1 capsule by mouth 2 (two) times daily. 04/23/17   [provider]  valGANciclovir (VALCYTE) 450 MG tablet Take 1 tablet by mouth 2 (two) times daily. 04/21/17   [provider]     Allergies Patient has no known allergies.   No family history on file.  Social History Social History   Tobacco Use  . Smoking status: Current Some Day Smoker  . Smokeless tobacco: Never Used  Substance Use Topics  . Alcohol use: No  . Drug use: No    Review of Systems  Constitutional:   No fever or chills.  ENT:   No sore throat. No rhinorrhea. Cardiovascular:   No chest pain or syncope. Respiratory:   No dyspnea or cough. Gastrointestinal:   Negative for abdominal pain,  vomiting and diarrhea.  Musculoskeletal:   Negative for focal pain or swelling All other systems reviewed and are negative except as documented above in ROS and HPI.  ____________________________________________   PHYSICAL EXAM:  VITAL SIGNS: ED Triage Vitals  Enc Vitals Group     BP 06/07/18 1957 (!) 175/93     Pulse Rate 06/07/18 1956 92     Resp 06/07/18 1956 18     Temp 06/07/18 1956 97.7 F (36.5 C)     Temp Source 06/07/18 1956 Oral     SpO2 06/07/18 1957 98 %     Weight 06/07/18 1956 (!) 320 lb (145.2 kg)     Height  06/07/18 1956 5\' 11"  (1.803 m)     Head Circumference --      Peak Flow --      Pain Score 06/07/18 1956 0     Pain Loc --      Pain Edu? --      Excl. in [place]? --     Vital signs reviewed, nursing assessments reviewed.   Constitutional:   Alert and oriented. Non-toxic appearance. Eyes:   Conjunctivae are normal. EOMI. PERRL. ENT      Head:   Normocephalic and atraumatic.      Nose:   No congestion/rhinnorhea.       Mouth/Throat:   MMM, no pharyngeal erythema. No peritonsillar mass.       Neck:   No meningismus. Full ROM. Hematological/Lymphatic/Immunilogical:   No cervical lymphadenopathy. Cardiovascular:   RRR. Symmetric bilateral radial and DP pulses.  No murmurs.  Respiratory:   Normal respiratory effort without tachypnea/retractions. Breath sounds are clear and equal bilaterally. No wheezes/rales/rhonchi. Gastrointestinal:   Soft and nontender. Non distended. There is no CVA tenderness.  No rebound, rigidity, or guarding. Genitourinary:   deferred Musculoskeletal:   Normal range of motion in all extremities. No joint effusions.  No lower extremity tenderness.  No edema. Neurologic:   Normal speech and language.  Motor grossly intact. No acute focal neurologic deficits are appreciated.  Skin:    Skin is warm, dry and intact. No rash noted.  No petechiae, purpura, or bullae.  ____________________________________________    LABS (pertinent positives/negatives) (all labs ordered are listed, but only abnormal results are displayed) Labs Reviewed  COMPREHENSIVE METABOLIC PANEL - Abnormal; Notable for the following components:      Result Value   CO2 21 (*)    BUN 26 (*)    Creatinine, Ser 2.30 (*)    Total Protein 6.2 (*)    GFR calc non Af Amer 35 (*)    GFR calc Af Amer 40 (*)    All other components within normal limits  CBC WITH DIFFERENTIAL/PLATELET - Abnormal; Notable for the following components:   RBC 4.13 (*)    Hemoglobin 10.8 (*)    HCT 32.3 (*)    MCV 78.2  (*)    RDW 16.0 (*)    Neutro Abs 7.2 (*)    Lymphs Abs 0.6 (*)    All other components within normal limits  URINALYSIS, COMPLETE (UACMP) WITH MICROSCOPIC - Abnormal; Notable for the following components:   Color, Urine COLORLESS (*)    APPearance CLEAR (*)    Specific Gravity, Urine 1.004 (*)    Protein, ur 100 (*)    All other components within normal limits  GLUCOSE, CAPILLARY - Abnormal; Notable for the following components:   Glucose-Capillary 126 (*)    All other components within normal limits  GLUCOSE, CAPILLARY - Abnormal; Notable for the following components:   Glucose-Capillary 121 (*)    All other components within normal limits  GLUCOSE, CAPILLARY - Abnormal; Notable for the following components:   Glucose-Capillary 56 (*)    All other components within normal limits  GLUCOSE, CAPILLARY  LIPASE, BLOOD   ____________________________________________   EKG    ____________________________________________    RADIOLOGY  No results found.  ____________________________________________   PROCEDURES Procedures  ____________________________________________    CLINICAL IMPRESSION / ASSESSMENT AND PLAN / ED COURSE  Pertinent labs & imaging results that were available during my care of the patient were reviewed by me and considered in my medical decision making (see chart for details).    Patient well-appearing and nontoxic.  Euglycemic here in the ED.  Due to use of long-acting insulins will need to monitor.  Will allow the patient to eat at will.  Clinical Course as of Jun 08 45  Nancy Fetter Jun 07, 2018  2036 Stable ckd. Anion gap normal. K normal. Will hydrate, anticipate DC.    [PS]    Clinical Course User Index [PS] Carrie Mew, MD     ----------------------------------------- 12:47 AM on 06/08/2018 -----------------------------------------  Blood sugars remained stable.  Had a somewhat low blood sugar reading of 56, but asymptomatic, and  tolerating oral intake just fine.  I will have him hold his insulins tonight and resume his usual dosing tomorrow.  Can return if worse.  Does not appear to have taken a physiologically significant overdose.  No electrolyte abnormality or significant dehydration.  ____________________________________________   FINAL CLINICAL IMPRESSION(S) / ED DIAGNOSES    Final diagnoses:  Insulin overdose, accidental or unintentional, initial encounter  Type 2 diabetes mellitus with complication, with long-term current use of insulin [facility])     ED Discharge Orders    None      Portions of this note were generated with dragon dictation software. Dictation errors may occur despite best attempts at proofreading.    Carrie Mew, MD 06/08/18 [phone]

## 2018-06-08 NOTE — Discharge Instructions (Signed)
Do not take any additional insulin tonight. You can continue your short acting insulin tomorrow and resume your Lantus insulin tomorrow night.

## 2018-06-08 NOTE — ED Notes (Signed)

## 2018-06-11 MED ORDER — TACROLIMUS 1 MG CAPSULE
ORAL_CAPSULE | Freq: Two times a day (BID) | ORAL | 3 refills | 0.00000 days | Status: CP
Start: 2018-06-11 — End: 2018-06-15

## 2018-06-11 MED ORDER — TACROLIMUS 1 MG CAPSULE: 1 mg | capsule | Freq: Two times a day (BID) | 3 refills | 0 days | Status: AC

## 2018-06-11 NOTE — Unmapped (Signed)
Pt prograf level 10.1, advised to decrease prograf to 1mg  BID and repeat labs next week

## 2018-06-11 NOTE — Unmapped (Signed)
[facility] Specialty Medication Referral: No PA required    Medication (Brand/Generic): TACROLIMUS 1MG     Initial FSI Test Claim completed with resulted information below:  No PA required  Patient ABLE to fill at [facility]  Insurance Company:  ALL  Anticipated Copay: $15.74    As Co-pay is under $100 defined limit, per policy there will be no further investigation of need for financial assistance at this time unless patient requests. This referral has been communicated to the provider and handed off to the [facility] team for further processing and filling of prescribed medication.   ______________________________________________________________________  Please utilize this referral for viewing purposes as it will serve as the central location for all relevant documentation and updates.

## 2018-06-11 NOTE — Unmapped (Signed)
New rx for tacrolimus sent to [facility]. I attempted to call patient last week and had to leave a message. Finally reached patient today and he states he does NOT want to fill with [facility] - instead he wants to continue filling with [facility] in [place] which is where he currently gets all his meds. I will message coordinator today. Disenrolling patient from specialty calls as well.

## 2018-06-15 MED ORDER — TACROLIMUS 1 MG CAPSULE
ORAL_CAPSULE | Freq: Two times a day (BID) | ORAL | 3 refills | 0.00000 days | Status: CP
Start: 2018-06-15 — End: 2018-06-26

## 2018-06-16 MED ORDER — TRAZODONE 50 MG TABLET
ORAL_TABLET | 11 refills | 0 days | Status: CP
Start: 2018-06-16 — End: 2019-06-23

## 2018-06-23 MED ORDER — SODIUM BICARBONATE 650 MG TABLET
ORAL_TABLET | Freq: Two times a day (BID) | ORAL | 11 refills | 0 days | Status: CP
Start: 2018-06-23 — End: 2018-08-27

## 2018-06-23 MED ORDER — CLONIDINE HCL 0.2 MG TABLET
ORAL_TABLET | Freq: Two times a day (BID) | ORAL | 11 refills | 0 days | Status: CP
Start: 2018-06-23 — End: 2019-06-23

## 2018-06-26 MED ORDER — TACROLIMUS 1 MG CAPSULE
ORAL_CAPSULE | ORAL | 3 refills | 0 days | Status: CP
Start: 2018-06-26 — End: 2018-10-23

## 2018-06-29 MED ORDER — CARVEDILOL 25 MG TABLET
ORAL_TABLET | Freq: Two times a day (BID) | ORAL | 3 refills | 0.00000 days | Status: CP
Start: 2018-06-29 — End: 2018-08-27

## 2018-08-02 ENCOUNTER — Ambulatory Visit: Admit: 2018-08-02 | Discharge: 2018-08-02 | Disposition: A | Discharge status: Home / Self Care | Payer: MEDICARE

## 2018-08-10 ENCOUNTER — Encounter: Payer: Self-pay | Admitting: Emergency Medicine

## 2018-08-10 ENCOUNTER — Emergency Department
Admission: EM | Admit: 2018-08-10 | Discharge: 2018-08-11 | Disposition: A | Discharge status: Home / Self Care | Payer: Medicare Other | Attending: Emergency Medicine | Admitting: Emergency Medicine

## 2018-08-10 DIAGNOSIS — F17200 Nicotine dependence, unspecified, uncomplicated: Secondary | ICD-10-CM | POA: Insufficient documentation

## 2018-08-10 DIAGNOSIS — E119 Type 2 diabetes mellitus without complications: Secondary | ICD-10-CM | POA: Diagnosis not present

## 2018-08-10 DIAGNOSIS — R1110 Vomiting, unspecified: Secondary | ICD-10-CM | POA: Diagnosis present

## 2018-08-10 DIAGNOSIS — I10 Essential (primary) hypertension: Secondary | ICD-10-CM | POA: Insufficient documentation

## 2018-08-10 DIAGNOSIS — R197 Diarrhea, unspecified: Secondary | ICD-10-CM | POA: Diagnosis not present

## 2018-08-10 DIAGNOSIS — E860 Dehydration: Secondary | ICD-10-CM | POA: Insufficient documentation

## 2018-08-10 LAB — CBC WITH DIFFERENTIAL/PLATELET
BASOS ABS: 0 10*3/uL (ref 0–0.1)
BASOS PCT: 0 %
EOS ABS: 0.2 10*3/uL (ref 0–0.7)
Eosinophils Relative: 3 %
HCT: 28.1 % — ABNORMAL LOW (ref 40.0–52.0)
Hemoglobin: 9.2 g/dL — ABNORMAL LOW (ref 13.0–18.0)
Lymphocytes Relative: 11 %
Lymphs Abs: 0.7 10*3/uL — ABNORMAL LOW (ref 1.0–3.6)
MCH: 25.5 pg — ABNORMAL LOW (ref 26.0–34.0)
MCHC: 32.8 g/dL (ref 32.0–36.0)
MCV: 77.7 fL — ABNORMAL LOW (ref 80.0–100.0)
Monocytes Absolute: 0.6 10*3/uL (ref 0.2–1.0)
Monocytes Relative: 9 %
Neutro Abs: 4.6 10*3/uL (ref 1.4–6.5)
Neutrophils Relative %: 77 %
PLATELETS: 224 10*3/uL (ref 150–440)
RBC: 3.62 MIL/uL — AB (ref 4.40–5.90)
RDW: 15.4 % — AB (ref 11.5–14.5)
WBC: 6.1 10*3/uL (ref 3.8–10.6)

## 2018-08-10 LAB — COMPREHENSIVE METABOLIC PANEL
ALT: 16 U/L (ref 0–44)
AST: 17 U/L (ref 15–41)
Albumin: 3.6 g/dL (ref 3.5–5.0)
Alkaline Phosphatase: 63 U/L (ref 38–126)
Anion gap: 4 — ABNORMAL LOW (ref 5–15)
BUN: 32 mg/dL — AB (ref 6–20)
CO2: 24 mmol/L (ref 22–32)
CREATININE: 2.5 mg/dL — AB (ref 0.61–1.24)
Calcium: 8.7 mg/dL — ABNORMAL LOW (ref 8.9–10.3)
Chloride: 110 mmol/L (ref 98–111)
GFR calc Af Amer: 36 mL/min — ABNORMAL LOW (ref >60)
GFR calc non Af Amer: 31 mL/min — ABNORMAL LOW (ref >60)
Glucose, Bld: 72 mg/dL (ref 70–99)
POTASSIUM: 4.4 mmol/L (ref 3.5–5.1)
SODIUM: 138 mmol/L (ref 135–145)
Total Bilirubin: 0.7 mg/dL (ref 0.3–1.2)
Total Protein: 5.9 g/dL — ABNORMAL LOW (ref 6.5–8.1)

## 2018-08-10 LAB — LIPASE, BLOOD: Lipase: 91 U/L — ABNORMAL HIGH (ref 11–51)

## 2018-08-10 MED ORDER — ONDANSETRON HCL 4 MG/2ML IJ SOLN
4.0000 mg | Freq: Once | INTRAMUSCULAR | Status: AC
  Administered 2018-08-10: 4 mg via INTRAVENOUS
  Filled 2018-08-10: qty 2

## 2018-08-10 MED ORDER — SODIUM CHLORIDE 0.9 % IV SOLN
Freq: Once | INTRAVENOUS | Status: AC
  Administered 2018-08-10: 22:00:00 via INTRAVENOUS

## 2018-08-10 MED ORDER — ONDANSETRON 4 MG PO TBDP: 4.0000 mg | ORAL_TABLET | Freq: Three times a day (TID) | ORAL | 0 refills | Status: AC | PRN

## 2018-08-10 MED ORDER — MORPHINE SULFATE (PF) 4 MG/ML IV SOLN
4.0000 mg | Freq: Once | INTRAVENOUS | Status: AC
  Administered 2018-08-10: 4 mg via INTRAVENOUS
  Filled 2018-08-10: qty 1

## 2018-08-10 NOTE — ED Triage Notes (Signed)
Pt arrives from home with complaints of generalized body aches, diarrhea, & vomiting. 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

## 2018-08-10 NOTE — ED Provider Notes (Signed)
[facility] Emergency Department Provider Note       Time seen: ----------------------------------------- 9:41 PM on 08/10/2018 -----------------------------------------   I have reviewed the triage vital signs and the nursing notes.  HISTORY   Chief Complaint Generalized Body Aches    HPI Eric Frey is a 37 y.o. male with a history of diabetes, hypertension, renal transplant who presents to the ED for generalized aches with vomiting and diarrhea.  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  He has not had any fever or chills, denies any pain over his transplanted kidney, he denies any decrease in his urine output.  Patient states he cannot keep anything down today.  Past Medical History:  Diagnosis Date  . Diabetes mellitus without complication ([place])   . Hypertension   . Renal disorder    kidney transplant 2008    There are no active problems to display for this patient.   Past Surgical History:  Procedure Laterality Date  . KIDNEY TRANSPLANT      Allergies Patient has no known allergies.  Social History Social History   Tobacco Use  . Smoking status: Current Some Day Smoker  . Smokeless tobacco: Never Used  Substance Use Topics  . Alcohol use: No  . Drug use: No   Review of Systems Constitutional: Negative for fever. Cardiovascular: Negative for chest pain. Respiratory: Negative for shortness of breath. Gastrointestinal: Positive for vomiting and diarrhea, negative for abdominal pain Genitourinary: Negative for dysuria. Musculoskeletal: Negative for back pain. Skin: Negative for rash. Neurological: Negative for headaches, focal weakness or numbness.  All systems negative/normal/unremarkable except as stated in the HPI  ____________________________________________   PHYSICAL EXAM:  VITAL SIGNS: ED Triage Vitals  Enc Vitals Group     BP 08/10/18 2128 (!) 169/94     Pulse Rate 08/10/18  2128 81     Resp 08/10/18 2128 15     Temp 08/10/18 2128 97.7 F (36.5 C)     Temp Source 08/10/18 2128 Oral     SpO2 08/10/18 2128 98 %     Weight 08/10/18 2126 (!) 310 lb (140.6 kg)     Height 08/10/18 2126 5\' 11"  (1.803 m)     Head Circumference --      Peak Flow --      Pain Score 08/10/18 2126 3     Pain Loc --      Pain Edu? --      Excl. in [place]? --    Constitutional: Alert and oriented. Well appearing and in no distress. Eyes: Conjunctivae are normal. Normal extraocular movements. ENT   Head: Normocephalic and atraumatic.   Nose: No congestion/rhinnorhea.   Mouth/Throat: Mucous membranes are moist.   Neck: No stridor. Cardiovascular: Normal rate, regular rhythm. No murmurs, rubs, or gallops. Respiratory: Normal respiratory effort without tachypnea nor retractions. Breath sounds are clear and equal bilaterally. No wheezes/rales/rhonchi. Gastrointestinal: Soft and nontender. Normal bowel sounds Musculoskeletal: Nontender with normal range of motion in extremities.  Positive for peripheral edema Neurologic:  Normal speech and language. No gross focal neurologic deficits are appreciated.  Skin:  Skin is warm, dry and intact. No rash noted. Psychiatric: Mood and affect are normal. Speech and behavior are normal.  ____________________________________________  ED COURSE:  As part of my medical decision making, I reviewed the following data within the [place] History obtained from family if available, nursing notes, old chart and ekg, as well as notes from prior  ED visits. Patient presented for nausea, vomiting and diarrhea, we will assess with labs and imaging as indicated at this time.   Procedures ____________________________________________   LABS (pertinent positives/negatives)  Labs Reviewed  CBC WITH DIFFERENTIAL/PLATELET - Abnormal; Notable for the following components:      Result Value   RBC 3.62 (*)    Hemoglobin 9.2 (*)    HCT  28.1 (*)    MCV 77.7 (*)    MCH 25.5 (*)    RDW 15.4 (*)    Lymphs Abs 0.7 (*)    All other components within normal limits  COMPREHENSIVE METABOLIC PANEL - Abnormal; Notable for the following components:   BUN 32 (*)    Creatinine, Ser 2.50 (*)    Calcium 8.7 (*)    Total Protein 5.9 (*)    GFR calc non Af Amer 31 (*)    GFR calc Af Amer 36 (*)    Anion gap 4 (*)    All other components within normal limits  LIPASE, BLOOD - Abnormal; Notable for the following components:   Lipase 91 (*)    All other components within normal limits  URINALYSIS, COMPLETE (UACMP) WITH MICROSCOPIC    ____________________________________________  DIFFERENTIAL DIAGNOSIS   Gastroenteritis, dehydration, electrolyte abnormality, graft rejection  FINAL ASSESSMENT AND PLAN  Vomiting and diarrhea   Plan: The patient had presented for aches as well as diarrhea and vomiting. Patient's labs revealed some dehydration although he has a chronically elevated creatinine.  Unclear significance to his mildly elevated lipase, likely viral.  His urinalysis is pending at this time but overall he is reporting improvement in his symptoms.   Laurence Aly, MD   Note: This note was generated in part or whole with voice recognition software. Voice recognition is usually quite accurate but there are transcription errors that can and very often do occur. I apologize for any typographical errors that were not detected and corrected.     Earleen Newport, MD 08/10/18 2242

## 2018-08-10 NOTE — Discharge Instructions (Addendum)
Please follow up with your transplant physician.

## 2018-08-10 NOTE — ED Notes (Signed)
Pt with c/o N/V/D. Pt has had a kidney transplant in the past.

## 2018-08-11 LAB — URINALYSIS, COMPLETE (UACMP) WITH MICROSCOPIC
BACTERIA UA: NONE SEEN
Bilirubin Urine: NEGATIVE
Glucose, UA: NEGATIVE mg/dL
Hgb urine dipstick: NEGATIVE
Ketones, ur: NEGATIVE mg/dL
Leukocytes, UA: NEGATIVE
NITRITE: NEGATIVE
PH: 6 (ref 5.0–8.0)
Protein, ur: 100 mg/dL — AB
SPECIFIC GRAVITY, URINE: 1.008 (ref 1.005–1.030)
SQUAMOUS EPITHELIAL / LPF: NONE SEEN (ref 0–5)

## 2018-08-11 NOTE — ED Provider Notes (Signed)
-----------------------------------------   2:28 AM on 08/11/2018 -----------------------------------------   Blood pressure (!) 169/94, pulse 81, temperature 97.7 F (36.5 C), temperature source Oral, resp. rate 20, height 5\' 11"  (1.803 m), weight (!) 140.6 kg, SpO2 98 %.  Assuming care from Dr. Jimmye Norman.  In short, Eric Frey is a 37 y.o. male with a chief complaint of Generalized Body Aches .  Refer to the original H&P for additional details.  The current plan of care is to follow up the results of the urinalysis.     The patient's urinalysis is unremarkable.  There is no red blood cells, white blood cells or bacteria seen.  The patient will be discharged and encouraged to follow-up with his primary care physician.   Loney Hering, MD 08/11/18 [phone]

## 2018-08-18 ENCOUNTER — Ambulatory Visit: Admit: 2018-08-18 | Discharge: 2018-08-19 | Payer: MEDICARE

## 2018-08-18 DIAGNOSIS — Z940 Kidney transplant status: Principal | ICD-10-CM

## 2018-08-25 ENCOUNTER — Ambulatory Visit: Admit: 2018-08-25 | Discharge: 2018-08-26 | Payer: MEDICARE

## 2018-08-25 ENCOUNTER — Ambulatory Visit: Admit: 2018-08-25 | Discharge: 2018-08-26 | Payer: MEDICARE | Attending: Nephrology | Primary: Nephrology

## 2018-08-25 DIAGNOSIS — Z940 Kidney transplant status: Principal | ICD-10-CM

## 2018-08-25 DIAGNOSIS — D899 Disorder involving the immune mechanism, unspecified: Secondary | ICD-10-CM

## 2018-08-25 DIAGNOSIS — Z0000 Encounter for general adult medical examination without abnormal findings: Secondary | ICD-10-CM

## 2018-08-25 DIAGNOSIS — F419 Anxiety disorder, unspecified: Secondary | ICD-10-CM

## 2018-08-25 DIAGNOSIS — I10 Essential (primary) hypertension: Secondary | ICD-10-CM

## 2018-08-25 DIAGNOSIS — E785 Hyperlipidemia, unspecified: Secondary | ICD-10-CM

## 2018-08-25 MED ORDER — ATORVASTATIN 10 MG TABLET
ORAL_TABLET | Freq: Every day | ORAL | 3 refills | 0.00000 days | Status: CP
Start: 2018-08-25 — End: 2018-12-15

## 2018-08-25 MED ORDER — LOSARTAN 25 MG TABLET
ORAL_TABLET | Freq: Every day | ORAL | 3 refills | 0.00000 days | Status: CP
Start: 2018-08-25 — End: 2018-08-27

## 2018-08-25 MED ORDER — FUROSEMIDE 20 MG TABLET
ORAL_TABLET | Freq: Every day | ORAL | 3 refills | 0 days | Status: CP
Start: 2018-08-25 — End: 2018-08-27

## 2018-08-25 MED ORDER — CITALOPRAM 10 MG TABLET
ORAL_TABLET | 3 refills | 0 days | Status: CP
Start: 2018-08-25 — End: 2018-08-27

## 2018-08-27 MED ORDER — GLIMEPIRIDE 4 MG TABLET
ORAL_TABLET | Freq: Every morning | ORAL | 11 refills | 0 days | Status: CP
Start: 2018-08-27 — End: 2018-12-15

## 2018-08-27 MED ORDER — SODIUM BICARBONATE 650 MG TABLET
ORAL_TABLET | Freq: Two times a day (BID) | ORAL | 11 refills | 0 days | Status: CP
Start: 2018-08-27 — End: 2019-06-23

## 2018-08-27 MED ORDER — CARVEDILOL 25 MG TABLET
ORAL_TABLET | Freq: Two times a day (BID) | ORAL | 3 refills | 0.00000 days | Status: CP
Start: 2018-08-27 — End: 2019-06-23

## 2018-08-27 MED ORDER — FUROSEMIDE 40 MG TABLET
ORAL_TABLET | Freq: Every day | ORAL | 3 refills | 0 days | Status: CP
Start: 2018-08-27 — End: 2018-12-15

## 2018-08-27 MED ORDER — MAGNESIUM OXIDE 400 MG (241.3 MG MAGNESIUM) TABLET
ORAL_TABLET | Freq: Every day | ORAL | 11 refills | 0.00000 days | Status: CP
Start: 2018-08-27 — End: 2019-06-23

## 2018-08-27 MED ORDER — LOSARTAN 25 MG TABLET
ORAL_TABLET | Freq: Every day | ORAL | 11 refills | 0.00000 days | Status: CP
Start: 2018-08-27 — End: 2018-12-15

## 2018-08-27 MED ORDER — CITALOPRAM 10 MG TABLET
ORAL_TABLET | 3 refills | 0 days | Status: CP
Start: 2018-08-27 — End: 2019-05-05

## 2018-09-01 ENCOUNTER — Ambulatory Visit: Admit: 2018-09-01 | Discharge: 2018-09-02 | Payer: MEDICARE | Attending: Family | Primary: Family

## 2018-09-01 ENCOUNTER — Ambulatory Visit: Admit: 2018-09-01 | Discharge: 2018-09-02 | Payer: MEDICARE

## 2018-09-01 DIAGNOSIS — M25511 Pain in right shoulder: Principal | ICD-10-CM

## 2018-09-01 MED ORDER — DIAZEPAM 5 MG TABLET
ORAL_TABLET | 0 refills | 0 days | Status: CP
Start: 2018-09-01 — End: 2018-12-15

## 2018-09-04 ENCOUNTER — Ambulatory Visit: Admit: 2018-09-04 | Discharge: 2018-09-05 | Payer: MEDICARE

## 2018-09-04 DIAGNOSIS — M25511 Pain in right shoulder: Principal | ICD-10-CM

## 2018-09-23 MED ORDER — CELLCEPT 500 MG TABLET
ORAL_TABLET | Freq: Two times a day (BID) | ORAL | 3 refills | 0 days | Status: CP
Start: 2018-09-23 — End: 2018-10-23

## 2018-09-24 NOTE — Unmapped (Signed)
[facility] Specialty Medication Referral: PA APPROVED    Medication (Brand/Generic): CELLCEPT    Initial Test Claim completed with resulted information below:  No PA required  Patient ABLE to fill at [facility]  Insurance Company:  PART B  Anticipated Copay: $40.32  Is anticipated copay with a copay card or grant? NO    As Co-pay is under $25 defined limit, per policy there will be no further investigation of need for financial assistance at this time unless patient requests. This referral has been communicated to the provider and handed off to the [facility] team for further processing and filling of prescribed medication.   ______________________________________________________________________  Please utilize this referral for viewing purposes as it will serve as the central location for all relevant documentation and updates.

## 2018-09-29 NOTE — Unmapped (Signed)
Patient declined getting Cellept today.  Patient states that he is getting Cellcept through the manufacturer. Will unenroll and message coordinator today.

## 2018-09-29 NOTE — Unmapped (Signed)
[facility] orders updated

## 2018-10-01 ENCOUNTER — Ambulatory Visit: Admit: 2018-10-01 | Discharge: 2018-10-01 | Payer: MEDICARE | Attending: Family | Primary: Family

## 2018-10-01 ENCOUNTER — Ambulatory Visit: Admit: 2018-10-01 | Discharge: 2018-10-01 | Payer: MEDICARE

## 2018-10-01 ENCOUNTER — Ambulatory Visit: Admit: 2018-10-01 | Discharge: 2018-10-01 | Payer: MEDICARE | Attending: Registered" | Primary: Registered"

## 2018-10-01 DIAGNOSIS — Z7189 Other specified counseling: Secondary | ICD-10-CM

## 2018-10-01 DIAGNOSIS — Z6841 Body Mass Index (BMI) 40.0 and over, adult: Secondary | ICD-10-CM

## 2018-10-01 DIAGNOSIS — E108 Type 1 diabetes mellitus with unspecified complications: Secondary | ICD-10-CM

## 2018-10-01 DIAGNOSIS — G4733 Obstructive sleep apnea (adult) (pediatric): Secondary | ICD-10-CM

## 2018-10-01 DIAGNOSIS — I10 Essential (primary) hypertension: Secondary | ICD-10-CM

## 2018-10-01 DIAGNOSIS — Z940 Kidney transplant status: Principal | ICD-10-CM

## 2018-10-01 DIAGNOSIS — R799 Abnormal finding of blood chemistry, unspecified: Secondary | ICD-10-CM

## 2018-10-08 ENCOUNTER — Encounter: Admit: 2018-10-08 | Discharge: 2018-10-08 | Payer: MEDICARE | Attending: Registered Nurse | Primary: Registered Nurse

## 2018-10-08 ENCOUNTER — Ambulatory Visit: Admit: 2018-10-08 | Discharge: 2018-10-08 | Payer: MEDICARE

## 2018-10-08 DIAGNOSIS — Z01818 Encounter for other preprocedural examination: Principal | ICD-10-CM

## 2018-10-23 MED ORDER — TACROLIMUS 1 MG CAPSULE
ORAL_CAPSULE | ORAL | 3 refills | 0 days | Status: CP
Start: 2018-10-23 — End: 2019-10-23

## 2018-10-23 MED ORDER — CELLCEPT 250 MG CAPSULE
ORAL_CAPSULE | Freq: Two times a day (BID) | ORAL | 3 refills | 0 days
Start: 2018-10-23 — End: 2019-05-04

## 2018-10-23 MED ORDER — VALGANCICLOVIR 450 MG TABLET
ORAL_TABLET | Freq: Every day | ORAL | 11 refills | 0 days | Status: CP
Start: 2018-10-23 — End: 2019-05-05

## 2018-10-27 ENCOUNTER — Ambulatory Visit
Admit: 2018-10-27 | Discharge: 2018-10-28 | Payer: MEDICARE | Attending: Critical Care Medicine | Primary: Critical Care Medicine

## 2018-10-27 DIAGNOSIS — R9389 Abnormal findings on diagnostic imaging of other specified body structures: Principal | ICD-10-CM

## 2018-11-06 ENCOUNTER — Ambulatory Visit: Admit: 2018-11-06 | Discharge: 2018-11-07 | Payer: MEDICARE

## 2018-11-06 ENCOUNTER — Ambulatory Visit: Admit: 2018-11-06 | Discharge: 2018-11-07 | Payer: MEDICARE | Attending: Registered" | Primary: Registered"

## 2018-11-06 DIAGNOSIS — Z940 Kidney transplant status: Principal | ICD-10-CM

## 2018-11-06 DIAGNOSIS — Z6841 Body Mass Index (BMI) 40.0 and over, adult: Secondary | ICD-10-CM

## 2018-11-06 DIAGNOSIS — Z0000 Encounter for general adult medical examination without abnormal findings: Secondary | ICD-10-CM

## 2018-11-17 ENCOUNTER — Ambulatory Visit: Admit: 2018-11-17 | Discharge: 2018-11-17 | Payer: MEDICARE

## 2018-11-17 DIAGNOSIS — R9389 Abnormal findings on diagnostic imaging of other specified body structures: Principal | ICD-10-CM

## 2018-11-17 DIAGNOSIS — Z940 Kidney transplant status: Secondary | ICD-10-CM

## 2018-11-17 DIAGNOSIS — J90 Pleural effusion, not elsewhere classified: Principal | ICD-10-CM

## 2018-11-18 ENCOUNTER — Ambulatory Visit
Admit: 2018-11-18 | Discharge: 2018-12-17 | Payer: MEDICARE | Attending: Physician Assistant | Primary: Physician Assistant

## 2018-11-18 ENCOUNTER — Ambulatory Visit
Admit: 2018-11-18 | Discharge: 2018-12-17 | Payer: MEDICARE | Attending: Rehabilitative and Restorative Service Providers" | Primary: Rehabilitative and Restorative Service Providers"

## 2018-11-18 DIAGNOSIS — R51 Headache: Secondary | ICD-10-CM

## 2018-11-18 DIAGNOSIS — G4719 Other hypersomnia: Secondary | ICD-10-CM

## 2018-11-18 DIAGNOSIS — G4730 Sleep apnea, unspecified: Principal | ICD-10-CM

## 2018-11-18 DIAGNOSIS — M25511 Pain in right shoulder: Principal | ICD-10-CM

## 2018-11-18 DIAGNOSIS — G4700 Insomnia, unspecified: Secondary | ICD-10-CM

## 2018-11-18 DIAGNOSIS — R0683 Snoring: Secondary | ICD-10-CM

## 2018-11-18 DIAGNOSIS — R0681 Apnea, not elsewhere classified: Secondary | ICD-10-CM

## 2018-11-18 DIAGNOSIS — R6889 Other general symptoms and signs: Secondary | ICD-10-CM

## 2018-11-18 DIAGNOSIS — G478 Other sleep disorders: Secondary | ICD-10-CM

## 2018-11-18 DIAGNOSIS — R351 Nocturia: Secondary | ICD-10-CM

## 2018-11-19 ENCOUNTER — Ambulatory Visit: Admit: 2018-11-19 | Discharge: 2018-11-21 | Payer: MEDICARE

## 2018-11-19 DIAGNOSIS — R351 Nocturia: Secondary | ICD-10-CM

## 2018-11-19 DIAGNOSIS — G478 Other sleep disorders: Secondary | ICD-10-CM

## 2018-11-19 DIAGNOSIS — R0681 Apnea, not elsewhere classified: Secondary | ICD-10-CM

## 2018-11-19 DIAGNOSIS — R51 Headache: Secondary | ICD-10-CM

## 2018-11-19 DIAGNOSIS — G4730 Sleep apnea, unspecified: Principal | ICD-10-CM

## 2018-11-19 DIAGNOSIS — G4719 Other hypersomnia: Secondary | ICD-10-CM

## 2018-11-19 DIAGNOSIS — R0683 Snoring: Secondary | ICD-10-CM

## 2018-11-19 DIAGNOSIS — R6889 Other general symptoms and signs: Secondary | ICD-10-CM

## 2018-11-24 DIAGNOSIS — E1169 Type 2 diabetes mellitus with other specified complication: Secondary | ICD-10-CM | POA: Insufficient documentation

## 2018-11-24 DIAGNOSIS — G4733 Obstructive sleep apnea (adult) (pediatric): Secondary | ICD-10-CM | POA: Insufficient documentation

## 2018-11-25 ENCOUNTER — Ambulatory Visit: Admit: 2018-11-25 | Discharge: 2018-11-26 | Payer: MEDICARE | Attending: Family | Primary: Family

## 2018-11-25 DIAGNOSIS — G8929 Other chronic pain: Secondary | ICD-10-CM

## 2018-11-25 DIAGNOSIS — M25511 Pain in right shoulder: Principal | ICD-10-CM

## 2018-11-25 MED ORDER — DIAZEPAM 5 MG TABLET
ORAL_TABLET | 0 refills | 0 days | Status: CP
Start: 2018-11-25 — End: 2018-12-15

## 2018-12-03 ENCOUNTER — Ambulatory Visit: Admit: 2018-12-03 | Discharge: 2018-12-04 | Payer: MEDICARE

## 2018-12-03 DIAGNOSIS — G8929 Other chronic pain: Secondary | ICD-10-CM

## 2018-12-03 DIAGNOSIS — M25511 Pain in right shoulder: Principal | ICD-10-CM

## 2018-12-08 ENCOUNTER — Ambulatory Visit: Admit: 2018-12-08 | Discharge: 2018-12-09 | Payer: MEDICARE | Attending: Family | Primary: Family

## 2018-12-08 DIAGNOSIS — M75101 Unspecified rotator cuff tear or rupture of right shoulder, not specified as traumatic: Principal | ICD-10-CM

## 2018-12-09 DIAGNOSIS — M25511 Pain in right shoulder: Principal | ICD-10-CM

## 2018-12-09 DIAGNOSIS — M75101 Unspecified rotator cuff tear or rupture of right shoulder, not specified as traumatic: Secondary | ICD-10-CM

## 2018-12-10 ENCOUNTER — Ambulatory Visit: Admit: 2018-12-10 | Discharge: 2018-12-11 | Payer: MEDICARE

## 2018-12-10 DIAGNOSIS — M75101 Unspecified rotator cuff tear or rupture of right shoulder, not specified as traumatic: Principal | ICD-10-CM

## 2018-12-11 ENCOUNTER — Ambulatory Visit: Admit: 2018-12-11 | Discharge: 2018-12-12 | Payer: MEDICARE | Attending: Registered" | Primary: Registered"

## 2018-12-11 DIAGNOSIS — Z6841 Body Mass Index (BMI) 40.0 and over, adult: Secondary | ICD-10-CM

## 2018-12-15 ENCOUNTER — Ambulatory Visit: Admit: 2018-12-15 | Discharge: 2018-12-15 | Payer: MEDICARE | Attending: Nephrology | Primary: Nephrology

## 2018-12-15 ENCOUNTER — Ambulatory Visit: Admit: 2018-12-15 | Discharge: 2018-12-15 | Payer: MEDICARE

## 2018-12-15 DIAGNOSIS — D899 Disorder involving the immune mechanism, unspecified: Secondary | ICD-10-CM

## 2018-12-15 DIAGNOSIS — Z0000 Encounter for general adult medical examination without abnormal findings: Secondary | ICD-10-CM

## 2018-12-15 DIAGNOSIS — R801 Persistent proteinuria, unspecified: Secondary | ICD-10-CM

## 2018-12-15 DIAGNOSIS — Z940 Kidney transplant status: Principal | ICD-10-CM

## 2018-12-15 DIAGNOSIS — I10 Essential (primary) hypertension: Secondary | ICD-10-CM

## 2018-12-15 MED ORDER — LOSARTAN 50 MG TABLET
ORAL_TABLET | Freq: Every day | ORAL | 11 refills | 0.00000 days | Status: CP
Start: 2018-12-15 — End: 2019-12-15

## 2018-12-29 ENCOUNTER — Ambulatory Visit: Admit: 2018-12-29 | Discharge: 2018-12-30 | Payer: MEDICARE

## 2018-12-29 DIAGNOSIS — Z794 Long term (current) use of insulin: Secondary | ICD-10-CM

## 2018-12-29 DIAGNOSIS — Z940 Kidney transplant status: Principal | ICD-10-CM

## 2018-12-29 DIAGNOSIS — Z01818 Encounter for other preprocedural examination: Principal | ICD-10-CM

## 2018-12-29 DIAGNOSIS — Z6841 Body Mass Index (BMI) 40.0 and over, adult: Secondary | ICD-10-CM

## 2018-12-29 DIAGNOSIS — M75121 Complete rotator cuff tear or rupture of right shoulder, not specified as traumatic: Secondary | ICD-10-CM

## 2018-12-29 DIAGNOSIS — E1122 Type 2 diabetes mellitus with diabetic chronic kidney disease: Secondary | ICD-10-CM

## 2018-12-29 DIAGNOSIS — J189 Pneumonia, unspecified organism: Secondary | ICD-10-CM

## 2018-12-29 DIAGNOSIS — I10 Essential (primary) hypertension: Secondary | ICD-10-CM

## 2019-01-15 ENCOUNTER — Ambulatory Visit: Admit: 2019-01-15 | Discharge: 2019-01-16 | Payer: MEDICARE | Attending: Registered" | Primary: Registered"

## 2019-01-15 DIAGNOSIS — Z6841 Body Mass Index (BMI) 40.0 and over, adult: Secondary | ICD-10-CM

## 2019-01-25 DIAGNOSIS — M75121 Complete rotator cuff tear or rupture of right shoulder, not specified as traumatic: Principal | ICD-10-CM

## 2019-01-26 ENCOUNTER — Ambulatory Visit: Admit: 2019-01-26 | Discharge: 2019-01-27 | Payer: MEDICARE

## 2019-01-26 ENCOUNTER — Encounter: Admit: 2019-01-26 | Discharge: 2019-01-27 | Payer: MEDICARE | Attending: Anesthesiology | Primary: Anesthesiology

## 2019-01-26 DIAGNOSIS — M75121 Complete rotator cuff tear or rupture of right shoulder, not specified as traumatic: Principal | ICD-10-CM

## 2019-01-27 MED ORDER — ACETAMINOPHEN 500 MG TABLET
ORAL_TABLET | Freq: Three times a day (TID) | ORAL | 0 refills | 0 days | Status: CP
Start: 2019-01-27 — End: 2019-02-10

## 2019-01-27 MED ORDER — GABAPENTIN 100 MG CAPSULE
ORAL_CAPSULE | Freq: Three times a day (TID) | ORAL | 0 refills | 0 days | Status: CP
Start: 2019-01-27 — End: 2019-05-03

## 2019-01-27 MED ORDER — ASPIRIN 81 MG TABLET,DELAYED RELEASE
ORAL_TABLET | Freq: Every day | ORAL | 0 refills | 0 days | Status: CP
Start: 2019-01-27 — End: 2020-05-02

## 2019-01-27 MED ORDER — DOCUSATE SODIUM 100 MG CAPSULE
ORAL_CAPSULE | Freq: Two times a day (BID) | ORAL | 0 refills | 0.00000 days | Status: CP
Start: 2019-01-27 — End: 2019-02-10

## 2019-01-27 MED ORDER — OXYCODONE 5 MG TABLET
ORAL_TABLET | ORAL | 0 refills | 0 days | Status: CP | PRN
Start: 2019-01-27 — End: 2019-02-03

## 2019-02-04 MED ORDER — OXYCODONE 5 MG TABLET
ORAL_TABLET | ORAL | 0 refills | 0.00000 days | Status: CP | PRN
Start: 2019-02-04 — End: 2019-05-03

## 2019-02-08 ENCOUNTER — Ambulatory Visit: Admit: 2019-02-08 | Discharge: 2019-02-09 | Payer: MEDICARE

## 2019-02-08 DIAGNOSIS — M75101 Unspecified rotator cuff tear or rupture of right shoulder, not specified as traumatic: Secondary | ICD-10-CM

## 2019-02-12 ENCOUNTER — Ambulatory Visit
Admit: 2019-02-12 | Discharge: 2019-02-12 | Payer: MEDICARE | Attending: Physician Assistant | Primary: Physician Assistant

## 2019-02-12 ENCOUNTER — Ambulatory Visit: Admit: 2019-02-12 | Discharge: 2019-02-12 | Payer: MEDICARE | Attending: Registered" | Primary: Registered"

## 2019-02-12 DIAGNOSIS — G4733 Obstructive sleep apnea (adult) (pediatric): Principal | ICD-10-CM

## 2019-02-12 DIAGNOSIS — Z6841 Body Mass Index (BMI) 40.0 and over, adult: Secondary | ICD-10-CM

## 2019-02-12 DIAGNOSIS — Z9989 Dependence on other enabling machines and devices: Secondary | ICD-10-CM

## 2019-02-16 ENCOUNTER — Ambulatory Visit
Admit: 2019-02-16 | Discharge: 2019-03-17 | Payer: MEDICARE | Attending: Rehabilitative and Restorative Service Providers" | Primary: Rehabilitative and Restorative Service Providers"

## 2019-02-16 DIAGNOSIS — Z9889 Other specified postprocedural states: Secondary | ICD-10-CM

## 2019-02-16 DIAGNOSIS — M75101 Unspecified rotator cuff tear or rupture of right shoulder, not specified as traumatic: Secondary | ICD-10-CM

## 2019-02-16 DIAGNOSIS — M25511 Pain in right shoulder: Principal | ICD-10-CM

## 2019-02-18 DIAGNOSIS — M25511 Pain in right shoulder: Principal | ICD-10-CM

## 2019-02-18 MED ORDER — OMEPRAZOLE 20 MG CAPSULE,DELAYED RELEASE: 20 mg | capsule | Freq: Every day | 3 refills | 0 days | Status: AC

## 2019-02-18 MED ORDER — OMEPRAZOLE 20 MG CAPSULE,DELAYED RELEASE
ORAL_CAPSULE | Freq: Every day | ORAL | 3 refills | 0.00000 days | Status: CP
Start: 2019-02-18 — End: 2019-05-03

## 2019-02-18 MED ORDER — PREDNISONE 5 MG TABLET: tablet | 3 refills | 0 days | Status: AC

## 2019-02-18 MED ORDER — PREDNISONE 5 MG TABLET
ORAL_TABLET | ORAL | 3 refills | 0.00000 days | Status: CP
Start: 2019-02-18 — End: 2019-05-04

## 2019-02-24 DIAGNOSIS — M25511 Pain in right shoulder: Principal | ICD-10-CM

## 2019-02-26 DIAGNOSIS — M25511 Pain in right shoulder: Principal | ICD-10-CM

## 2019-02-26 DIAGNOSIS — Z9889 Other specified postprocedural states: Secondary | ICD-10-CM

## 2019-03-01 ENCOUNTER — Ambulatory Visit: Admit: 2019-03-01 | Discharge: 2019-03-02 | Payer: MEDICARE

## 2019-03-01 DIAGNOSIS — F1910 Other psychoactive substance abuse, uncomplicated: Principal | ICD-10-CM

## 2019-03-01 DIAGNOSIS — I639 Cerebral infarction, unspecified: Principal | ICD-10-CM

## 2019-03-01 DIAGNOSIS — R0683 Snoring: Principal | ICD-10-CM

## 2019-03-01 DIAGNOSIS — M75101 Unspecified rotator cuff tear or rupture of right shoulder, not specified as traumatic: Principal | ICD-10-CM

## 2019-03-01 DIAGNOSIS — N19 Unspecified kidney failure: Principal | ICD-10-CM

## 2019-03-01 DIAGNOSIS — I10 Essential (primary) hypertension: Principal | ICD-10-CM

## 2019-03-01 DIAGNOSIS — D899 Disorder involving the immune mechanism, unspecified: Principal | ICD-10-CM

## 2019-03-01 DIAGNOSIS — E119 Type 2 diabetes mellitus without complications: Principal | ICD-10-CM

## 2019-03-01 DIAGNOSIS — G459 Transient cerebral ischemic attack, unspecified: Principal | ICD-10-CM

## 2019-03-01 DIAGNOSIS — Z6841 Body Mass Index (BMI) 40.0 and over, adult: Principal | ICD-10-CM

## 2019-03-01 DIAGNOSIS — E785 Hyperlipidemia, unspecified: Principal | ICD-10-CM

## 2019-03-01 DIAGNOSIS — T300 Burn of unspecified body region, unspecified degree: Principal | ICD-10-CM

## 2019-03-01 DIAGNOSIS — F1020 Alcohol dependence, uncomplicated: Principal | ICD-10-CM

## 2019-03-01 DIAGNOSIS — G4733 Obstructive sleep apnea (adult) (pediatric): Principal | ICD-10-CM

## 2019-03-01 DIAGNOSIS — Z940 Kidney transplant status: Principal | ICD-10-CM

## 2019-03-03 DIAGNOSIS — Z905 Acquired absence of kidney: Principal | ICD-10-CM

## 2019-03-03 DIAGNOSIS — F1020 Alcohol dependence, uncomplicated: Principal | ICD-10-CM

## 2019-03-03 DIAGNOSIS — M25511 Pain in right shoulder: Principal | ICD-10-CM

## 2019-03-03 DIAGNOSIS — Z87891 Personal history of nicotine dependence: Principal | ICD-10-CM

## 2019-03-03 DIAGNOSIS — R531 Weakness: Principal | ICD-10-CM

## 2019-03-03 DIAGNOSIS — Z7952 Long term (current) use of systemic steroids: Principal | ICD-10-CM

## 2019-03-03 DIAGNOSIS — Z794 Long term (current) use of insulin: Principal | ICD-10-CM

## 2019-03-03 DIAGNOSIS — Z6841 Body Mass Index (BMI) 40.0 and over, adult: Principal | ICD-10-CM

## 2019-03-03 DIAGNOSIS — Z8673 Personal history of transient ischemic attack (TIA), and cerebral infarction without residual deficits: Principal | ICD-10-CM

## 2019-03-03 DIAGNOSIS — G4733 Obstructive sleep apnea (adult) (pediatric): Principal | ICD-10-CM

## 2019-03-03 DIAGNOSIS — I10 Essential (primary) hypertension: Principal | ICD-10-CM

## 2019-03-03 DIAGNOSIS — G4700 Insomnia, unspecified: Principal | ICD-10-CM

## 2019-03-03 DIAGNOSIS — E119 Type 2 diabetes mellitus without complications: Principal | ICD-10-CM

## 2019-03-03 DIAGNOSIS — Z7409 Other reduced mobility: Principal | ICD-10-CM

## 2019-03-03 DIAGNOSIS — F1919 Other psychoactive substance abuse with unspecified psychoactive substance-induced disorder: Principal | ICD-10-CM

## 2019-03-03 DIAGNOSIS — Z79899 Other long term (current) drug therapy: Principal | ICD-10-CM

## 2019-03-03 DIAGNOSIS — E785 Hyperlipidemia, unspecified: Principal | ICD-10-CM

## 2019-03-03 DIAGNOSIS — Z7982 Long term (current) use of aspirin: Principal | ICD-10-CM

## 2019-03-03 DIAGNOSIS — M75101 Unspecified rotator cuff tear or rupture of right shoulder, not specified as traumatic: Principal | ICD-10-CM

## 2019-03-03 DIAGNOSIS — Z940 Kidney transplant status: Principal | ICD-10-CM

## 2019-03-05 DIAGNOSIS — E119 Type 2 diabetes mellitus without complications: Principal | ICD-10-CM

## 2019-03-05 DIAGNOSIS — Z940 Kidney transplant status: Principal | ICD-10-CM

## 2019-03-05 DIAGNOSIS — R531 Weakness: Principal | ICD-10-CM

## 2019-03-05 DIAGNOSIS — G4733 Obstructive sleep apnea (adult) (pediatric): Principal | ICD-10-CM

## 2019-03-05 DIAGNOSIS — F1020 Alcohol dependence, uncomplicated: Principal | ICD-10-CM

## 2019-03-05 DIAGNOSIS — G4700 Insomnia, unspecified: Principal | ICD-10-CM

## 2019-03-05 DIAGNOSIS — E785 Hyperlipidemia, unspecified: Principal | ICD-10-CM

## 2019-03-05 DIAGNOSIS — Z9889 Other specified postprocedural states: Principal | ICD-10-CM

## 2019-03-05 DIAGNOSIS — Z794 Long term (current) use of insulin: Principal | ICD-10-CM

## 2019-03-05 DIAGNOSIS — M75101 Unspecified rotator cuff tear or rupture of right shoulder, not specified as traumatic: Principal | ICD-10-CM

## 2019-03-05 DIAGNOSIS — Z7982 Long term (current) use of aspirin: Principal | ICD-10-CM

## 2019-03-05 DIAGNOSIS — Z87891 Personal history of nicotine dependence: Principal | ICD-10-CM

## 2019-03-05 DIAGNOSIS — F1919 Other psychoactive substance abuse with unspecified psychoactive substance-induced disorder: Principal | ICD-10-CM

## 2019-03-05 DIAGNOSIS — M25511 Pain in right shoulder: Principal | ICD-10-CM

## 2019-03-05 DIAGNOSIS — Z79899 Other long term (current) drug therapy: Principal | ICD-10-CM

## 2019-03-05 DIAGNOSIS — Z6841 Body Mass Index (BMI) 40.0 and over, adult: Principal | ICD-10-CM

## 2019-03-05 DIAGNOSIS — Z8673 Personal history of transient ischemic attack (TIA), and cerebral infarction without residual deficits: Principal | ICD-10-CM

## 2019-03-05 DIAGNOSIS — Z7952 Long term (current) use of systemic steroids: Principal | ICD-10-CM

## 2019-03-05 DIAGNOSIS — Z7409 Other reduced mobility: Principal | ICD-10-CM

## 2019-03-05 DIAGNOSIS — I10 Essential (primary) hypertension: Principal | ICD-10-CM

## 2019-03-05 DIAGNOSIS — Z905 Acquired absence of kidney: Principal | ICD-10-CM

## 2019-03-10 DIAGNOSIS — Z905 Acquired absence of kidney: Principal | ICD-10-CM

## 2019-03-10 DIAGNOSIS — Z8673 Personal history of transient ischemic attack (TIA), and cerebral infarction without residual deficits: Principal | ICD-10-CM

## 2019-03-10 DIAGNOSIS — F1919 Other psychoactive substance abuse with unspecified psychoactive substance-induced disorder: Principal | ICD-10-CM

## 2019-03-10 DIAGNOSIS — Z9889 Other specified postprocedural states: Principal | ICD-10-CM

## 2019-03-10 DIAGNOSIS — Z7952 Long term (current) use of systemic steroids: Principal | ICD-10-CM

## 2019-03-10 DIAGNOSIS — Z940 Kidney transplant status: Principal | ICD-10-CM

## 2019-03-10 DIAGNOSIS — I10 Essential (primary) hypertension: Principal | ICD-10-CM

## 2019-03-10 DIAGNOSIS — E785 Hyperlipidemia, unspecified: Principal | ICD-10-CM

## 2019-03-10 DIAGNOSIS — G4733 Obstructive sleep apnea (adult) (pediatric): Principal | ICD-10-CM

## 2019-03-10 DIAGNOSIS — M75101 Unspecified rotator cuff tear or rupture of right shoulder, not specified as traumatic: Principal | ICD-10-CM

## 2019-03-10 DIAGNOSIS — E119 Type 2 diabetes mellitus without complications: Principal | ICD-10-CM

## 2019-03-10 DIAGNOSIS — Z87891 Personal history of nicotine dependence: Principal | ICD-10-CM

## 2019-03-10 DIAGNOSIS — Z7982 Long term (current) use of aspirin: Principal | ICD-10-CM

## 2019-03-10 DIAGNOSIS — G4700 Insomnia, unspecified: Principal | ICD-10-CM

## 2019-03-10 DIAGNOSIS — R531 Weakness: Principal | ICD-10-CM

## 2019-03-10 DIAGNOSIS — F1020 Alcohol dependence, uncomplicated: Principal | ICD-10-CM

## 2019-03-10 DIAGNOSIS — Z794 Long term (current) use of insulin: Principal | ICD-10-CM

## 2019-03-10 DIAGNOSIS — Z6841 Body Mass Index (BMI) 40.0 and over, adult: Principal | ICD-10-CM

## 2019-03-10 DIAGNOSIS — M25511 Pain in right shoulder: Principal | ICD-10-CM

## 2019-03-10 DIAGNOSIS — Z7409 Other reduced mobility: Principal | ICD-10-CM

## 2019-03-10 DIAGNOSIS — Z79899 Other long term (current) drug therapy: Principal | ICD-10-CM

## 2019-03-12 DIAGNOSIS — Z6841 Body Mass Index (BMI) 40.0 and over, adult: Principal | ICD-10-CM

## 2019-03-12 DIAGNOSIS — F1020 Alcohol dependence, uncomplicated: Principal | ICD-10-CM

## 2019-03-12 DIAGNOSIS — Z8673 Personal history of transient ischemic attack (TIA), and cerebral infarction without residual deficits: Principal | ICD-10-CM

## 2019-03-12 DIAGNOSIS — Z9889 Other specified postprocedural states: Principal | ICD-10-CM

## 2019-03-12 DIAGNOSIS — E119 Type 2 diabetes mellitus without complications: Principal | ICD-10-CM

## 2019-03-12 DIAGNOSIS — Z87891 Personal history of nicotine dependence: Principal | ICD-10-CM

## 2019-03-12 DIAGNOSIS — R531 Weakness: Principal | ICD-10-CM

## 2019-03-12 DIAGNOSIS — M75101 Unspecified rotator cuff tear or rupture of right shoulder, not specified as traumatic: Principal | ICD-10-CM

## 2019-03-12 DIAGNOSIS — Z794 Long term (current) use of insulin: Principal | ICD-10-CM

## 2019-03-12 DIAGNOSIS — Z7982 Long term (current) use of aspirin: Principal | ICD-10-CM

## 2019-03-12 DIAGNOSIS — Z940 Kidney transplant status: Principal | ICD-10-CM

## 2019-03-12 DIAGNOSIS — G4700 Insomnia, unspecified: Principal | ICD-10-CM

## 2019-03-12 DIAGNOSIS — I10 Essential (primary) hypertension: Principal | ICD-10-CM

## 2019-03-12 DIAGNOSIS — Z79899 Other long term (current) drug therapy: Principal | ICD-10-CM

## 2019-03-12 DIAGNOSIS — F1919 Other psychoactive substance abuse with unspecified psychoactive substance-induced disorder: Principal | ICD-10-CM

## 2019-03-12 DIAGNOSIS — E785 Hyperlipidemia, unspecified: Principal | ICD-10-CM

## 2019-03-12 DIAGNOSIS — G4733 Obstructive sleep apnea (adult) (pediatric): Principal | ICD-10-CM

## 2019-03-12 DIAGNOSIS — Z7952 Long term (current) use of systemic steroids: Principal | ICD-10-CM

## 2019-03-12 DIAGNOSIS — Z7409 Other reduced mobility: Principal | ICD-10-CM

## 2019-03-12 DIAGNOSIS — Z905 Acquired absence of kidney: Principal | ICD-10-CM

## 2019-03-12 DIAGNOSIS — M25511 Pain in right shoulder: Principal | ICD-10-CM

## 2019-03-15 DIAGNOSIS — Z905 Acquired absence of kidney: Principal | ICD-10-CM

## 2019-03-15 DIAGNOSIS — Z79899 Other long term (current) drug therapy: Principal | ICD-10-CM

## 2019-03-15 DIAGNOSIS — Z8673 Personal history of transient ischemic attack (TIA), and cerebral infarction without residual deficits: Principal | ICD-10-CM

## 2019-03-15 DIAGNOSIS — Z7982 Long term (current) use of aspirin: Principal | ICD-10-CM

## 2019-03-15 DIAGNOSIS — E119 Type 2 diabetes mellitus without complications: Principal | ICD-10-CM

## 2019-03-15 DIAGNOSIS — Z940 Kidney transplant status: Principal | ICD-10-CM

## 2019-03-15 DIAGNOSIS — Z794 Long term (current) use of insulin: Principal | ICD-10-CM

## 2019-03-15 DIAGNOSIS — D899 Disorder involving the immune mechanism, unspecified: Principal | ICD-10-CM

## 2019-03-15 DIAGNOSIS — M75101 Unspecified rotator cuff tear or rupture of right shoulder, not specified as traumatic: Principal | ICD-10-CM

## 2019-03-15 DIAGNOSIS — G4700 Insomnia, unspecified: Principal | ICD-10-CM

## 2019-03-15 DIAGNOSIS — Z7952 Long term (current) use of systemic steroids: Principal | ICD-10-CM

## 2019-03-15 DIAGNOSIS — Z6841 Body Mass Index (BMI) 40.0 and over, adult: Principal | ICD-10-CM

## 2019-03-15 DIAGNOSIS — E785 Hyperlipidemia, unspecified: Principal | ICD-10-CM

## 2019-03-15 DIAGNOSIS — M25511 Pain in right shoulder: Principal | ICD-10-CM

## 2019-03-15 DIAGNOSIS — I10 Essential (primary) hypertension: Principal | ICD-10-CM

## 2019-03-15 DIAGNOSIS — F1919 Other psychoactive substance abuse with unspecified psychoactive substance-induced disorder: Principal | ICD-10-CM

## 2019-03-15 DIAGNOSIS — Z7409 Other reduced mobility: Principal | ICD-10-CM

## 2019-03-15 DIAGNOSIS — F1020 Alcohol dependence, uncomplicated: Principal | ICD-10-CM

## 2019-03-15 DIAGNOSIS — Z87891 Personal history of nicotine dependence: Principal | ICD-10-CM

## 2019-03-15 DIAGNOSIS — R531 Weakness: Principal | ICD-10-CM

## 2019-03-15 DIAGNOSIS — G4733 Obstructive sleep apnea (adult) (pediatric): Principal | ICD-10-CM

## 2019-03-23 ENCOUNTER — Ambulatory Visit
Admit: 2019-03-23 | Discharge: 2019-04-16 | Payer: MEDICARE | Attending: Rehabilitative and Restorative Service Providers" | Primary: Rehabilitative and Restorative Service Providers"

## 2019-03-23 DIAGNOSIS — Z8673 Personal history of transient ischemic attack (TIA), and cerebral infarction without residual deficits: Principal | ICD-10-CM

## 2019-03-23 DIAGNOSIS — R531 Weakness: Principal | ICD-10-CM

## 2019-03-23 DIAGNOSIS — E119 Type 2 diabetes mellitus without complications: Principal | ICD-10-CM

## 2019-03-23 DIAGNOSIS — Z905 Acquired absence of kidney: Principal | ICD-10-CM

## 2019-03-23 DIAGNOSIS — F1020 Alcohol dependence, uncomplicated: Principal | ICD-10-CM

## 2019-03-23 DIAGNOSIS — Z7952 Long term (current) use of systemic steroids: Principal | ICD-10-CM

## 2019-03-23 DIAGNOSIS — F1919 Other psychoactive substance abuse with unspecified psychoactive substance-induced disorder: Principal | ICD-10-CM

## 2019-03-23 DIAGNOSIS — M75101 Unspecified rotator cuff tear or rupture of right shoulder, not specified as traumatic: Principal | ICD-10-CM

## 2019-03-23 DIAGNOSIS — G4700 Insomnia, unspecified: Principal | ICD-10-CM

## 2019-03-23 DIAGNOSIS — Z7409 Other reduced mobility: Principal | ICD-10-CM

## 2019-03-23 DIAGNOSIS — I10 Essential (primary) hypertension: Principal | ICD-10-CM

## 2019-03-23 DIAGNOSIS — Z9889 Other specified postprocedural states: Principal | ICD-10-CM

## 2019-03-23 DIAGNOSIS — G4733 Obstructive sleep apnea (adult) (pediatric): Principal | ICD-10-CM

## 2019-03-23 DIAGNOSIS — Z79899 Other long term (current) drug therapy: Principal | ICD-10-CM

## 2019-03-23 DIAGNOSIS — Z794 Long term (current) use of insulin: Principal | ICD-10-CM

## 2019-03-23 DIAGNOSIS — Z940 Kidney transplant status: Principal | ICD-10-CM

## 2019-03-23 DIAGNOSIS — Z7982 Long term (current) use of aspirin: Principal | ICD-10-CM

## 2019-03-23 DIAGNOSIS — Z6841 Body Mass Index (BMI) 40.0 and over, adult: Principal | ICD-10-CM

## 2019-03-23 DIAGNOSIS — Z87891 Personal history of nicotine dependence: Principal | ICD-10-CM

## 2019-03-23 DIAGNOSIS — M25511 Pain in right shoulder: Principal | ICD-10-CM

## 2019-03-23 DIAGNOSIS — E785 Hyperlipidemia, unspecified: Principal | ICD-10-CM

## 2019-03-28 MED ORDER — ERGOCALCIFEROL (VITAMIN D2) 1,250 MCG (50,000 UNIT) CAPSULE
ORAL_CAPSULE | ORAL | 3 refills | 0 days | Status: CP
Start: 2019-03-28 — End: 2020-03-27

## 2019-03-29 DIAGNOSIS — Z940 Kidney transplant status: Principal | ICD-10-CM

## 2019-03-29 DIAGNOSIS — D899 Disorder involving the immune mechanism, unspecified: Principal | ICD-10-CM

## 2019-04-14 ENCOUNTER — Institutional Professional Consult (permissible substitution): Admit: 2019-04-14 | Discharge: 2019-04-15 | Payer: MEDICARE | Attending: Registered" | Primary: Registered"

## 2019-04-14 DIAGNOSIS — Z713 Dietary counseling and surveillance: Principal | ICD-10-CM

## 2019-04-22 MED ORDER — AMLODIPINE 10 MG TABLET
ORAL_TABLET | Freq: Every day | ORAL | 3 refills | 0 days | Status: CP
Start: 2019-04-22 — End: 2020-04-21

## 2019-05-04 ENCOUNTER — Ambulatory Visit: Admit: 2019-05-04 | Discharge: 2019-05-04 | Payer: MEDICARE

## 2019-05-04 ENCOUNTER — Telehealth: Admit: 2019-05-04 | Discharge: 2019-05-04 | Payer: MEDICARE | Attending: Nephrology | Primary: Nephrology

## 2019-05-04 DIAGNOSIS — Z940 Kidney transplant status: Principal | ICD-10-CM

## 2019-05-04 DIAGNOSIS — D709 Neutropenia, unspecified: Secondary | ICD-10-CM

## 2019-05-04 DIAGNOSIS — I129 Hypertensive chronic kidney disease with stage 1 through stage 4 chronic kidney disease, or unspecified chronic kidney disease: Secondary | ICD-10-CM

## 2019-05-04 DIAGNOSIS — Z6841 Body Mass Index (BMI) 40.0 and over, adult: Secondary | ICD-10-CM

## 2019-05-04 DIAGNOSIS — D899 Disorder involving the immune mechanism, unspecified: Secondary | ICD-10-CM

## 2019-05-04 DIAGNOSIS — E1122 Type 2 diabetes mellitus with diabetic chronic kidney disease: Secondary | ICD-10-CM

## 2019-05-04 DIAGNOSIS — I10 Essential (primary) hypertension: Secondary | ICD-10-CM

## 2019-05-04 DIAGNOSIS — R801 Persistent proteinuria, unspecified: Secondary | ICD-10-CM

## 2019-05-04 DIAGNOSIS — N184 Chronic kidney disease, stage 4 (severe): Secondary | ICD-10-CM

## 2019-05-04 MED ORDER — CELLCEPT 250 MG CAPSULE
ORAL_CAPSULE | Freq: Two times a day (BID) | ORAL | 3 refills | 0.00000 days
Start: 2019-05-04 — End: 2019-06-07

## 2019-05-07 ENCOUNTER — Ambulatory Visit: Admit: 2019-05-07 | Discharge: 2019-05-08 | Payer: MEDICARE

## 2019-05-07 DIAGNOSIS — Z940 Kidney transplant status: Principal | ICD-10-CM

## 2019-05-14 ENCOUNTER — Ambulatory Visit: Admit: 2019-05-14 | Discharge: 2019-05-15 | Payer: MEDICARE

## 2019-05-14 ENCOUNTER — Institutional Professional Consult (permissible substitution): Admit: 2019-05-14 | Discharge: 2019-05-15 | Payer: MEDICARE | Attending: Registered" | Primary: Registered"

## 2019-05-14 DIAGNOSIS — Z0000 Encounter for general adult medical examination without abnormal findings: Secondary | ICD-10-CM

## 2019-05-14 DIAGNOSIS — Z940 Kidney transplant status: Principal | ICD-10-CM

## 2019-05-14 DIAGNOSIS — Z6841 Body Mass Index (BMI) 40.0 and over, adult: Secondary | ICD-10-CM

## 2019-05-25 ENCOUNTER — Institutional Professional Consult (permissible substitution)
Admit: 2019-05-25 | Discharge: 2019-05-26 | Payer: MEDICARE | Attending: Physician Assistant | Primary: Physician Assistant

## 2019-05-25 DIAGNOSIS — R0981 Nasal congestion: Secondary | ICD-10-CM

## 2019-05-25 DIAGNOSIS — G4733 Obstructive sleep apnea (adult) (pediatric): Principal | ICD-10-CM

## 2019-05-25 DIAGNOSIS — Z9989 Dependence on other enabling machines and devices: Secondary | ICD-10-CM

## 2019-05-28 ENCOUNTER — Ambulatory Visit: Admit: 2019-05-28 | Discharge: 2019-05-29 | Payer: MEDICARE

## 2019-05-28 DIAGNOSIS — Z940 Kidney transplant status: Principal | ICD-10-CM

## 2019-05-28 DIAGNOSIS — Z0000 Encounter for general adult medical examination without abnormal findings: Secondary | ICD-10-CM

## 2019-06-03 ENCOUNTER — Telehealth: Admit: 2019-06-03 | Discharge: 2019-06-04 | Payer: MEDICARE | Attending: Clinical | Primary: Clinical

## 2019-06-03 DIAGNOSIS — Z6841 Body Mass Index (BMI) 40.0 and over, adult: Secondary | ICD-10-CM

## 2019-06-03 DIAGNOSIS — F54 Psychological and behavioral factors associated with disorders or diseases classified elsewhere: Secondary | ICD-10-CM

## 2019-06-07 MED ORDER — CELLCEPT 250 MG CAPSULE
ORAL_CAPSULE | Freq: Two times a day (BID) | ORAL | 3 refills | 0.00000 days | Status: CP
Start: 2019-06-07 — End: 2020-06-06

## 2019-06-11 ENCOUNTER — Ambulatory Visit: Admit: 2019-06-11 | Discharge: 2019-06-12 | Payer: MEDICARE

## 2019-06-11 DIAGNOSIS — Z0000 Encounter for general adult medical examination without abnormal findings: Secondary | ICD-10-CM

## 2019-06-11 DIAGNOSIS — Z940 Kidney transplant status: Principal | ICD-10-CM

## 2019-06-11 DIAGNOSIS — Z7189 Other specified counseling: Secondary | ICD-10-CM

## 2019-06-11 DIAGNOSIS — Z0389 Encounter for observation for other suspected diseases and conditions ruled out: Secondary | ICD-10-CM

## 2019-06-11 DIAGNOSIS — R799 Abnormal finding of blood chemistry, unspecified: Secondary | ICD-10-CM

## 2019-06-18 ENCOUNTER — Institutional Professional Consult (permissible substitution): Admit: 2019-06-18 | Discharge: 2019-06-19 | Payer: MEDICARE | Attending: Registered" | Primary: Registered"

## 2019-06-18 ENCOUNTER — Institutional Professional Consult (permissible substitution): Admit: 2019-06-18 | Discharge: 2019-06-19 | Payer: MEDICARE | Attending: Family | Primary: Family

## 2019-06-18 DIAGNOSIS — Z6841 Body Mass Index (BMI) 40.0 and over, adult: Principal | ICD-10-CM

## 2019-06-18 DIAGNOSIS — F1210 Cannabis abuse, uncomplicated: Secondary | ICD-10-CM

## 2019-06-18 DIAGNOSIS — Z7189 Other specified counseling: Secondary | ICD-10-CM

## 2019-06-18 DIAGNOSIS — E1122 Type 2 diabetes mellitus with diabetic chronic kidney disease: Secondary | ICD-10-CM

## 2019-06-18 DIAGNOSIS — Z794 Long term (current) use of insulin: Secondary | ICD-10-CM

## 2019-06-23 MED ORDER — SODIUM BICARBONATE 650 MG TABLET
ORAL_TABLET | Freq: Two times a day (BID) | ORAL | 11 refills | 0 days | Status: CP
Start: 2019-06-23 — End: 2020-06-22

## 2019-06-23 MED ORDER — TRAZODONE 50 MG TABLET
ORAL_TABLET | 11 refills | 0 days | Status: CP
Start: 2019-06-23 — End: 2019-06-25

## 2019-06-23 MED ORDER — MAGNESIUM OXIDE 400 MG (241.3 MG MAGNESIUM) TABLET
ORAL_TABLET | Freq: Every day | ORAL | 11 refills | 0.00000 days | Status: CP
Start: 2019-06-23 — End: 2020-06-22

## 2019-06-23 MED ORDER — CARVEDILOL 25 MG TABLET
ORAL_TABLET | Freq: Two times a day (BID) | ORAL | 3 refills | 0.00000 days | Status: CP
Start: 2019-06-23 — End: 2020-06-22

## 2019-06-23 MED ORDER — CLONIDINE HCL 0.2 MG TABLET
ORAL_TABLET | Freq: Two times a day (BID) | ORAL | 11 refills | 0.00000 days | Status: CP
Start: 2019-06-23 — End: 2019-06-25

## 2019-06-25 MED ORDER — CLONIDINE HCL 0.2 MG TABLET
ORAL_TABLET | Freq: Two times a day (BID) | ORAL | 11 refills | 0.00000 days | Status: CP
Start: 2019-06-25 — End: 2019-07-23

## 2019-06-25 MED ORDER — TRAZODONE 50 MG TABLET
ORAL_TABLET | 11 refills | 0 days | Status: CP
Start: 2019-06-25 — End: ?

## 2019-07-06 ENCOUNTER — Ambulatory Visit: Admit: 2019-07-06 | Discharge: 2019-07-07 | Payer: MEDICARE

## 2019-07-06 DIAGNOSIS — Z940 Kidney transplant status: Principal | ICD-10-CM

## 2019-07-06 DIAGNOSIS — Z0389 Encounter for observation for other suspected diseases and conditions ruled out: Secondary | ICD-10-CM

## 2019-07-06 DIAGNOSIS — R799 Abnormal finding of blood chemistry, unspecified: Secondary | ICD-10-CM

## 2019-07-22 ENCOUNTER — Ambulatory Visit: Admit: 2019-07-22 | Discharge: 2019-07-23 | Payer: MEDICARE

## 2019-07-22 DIAGNOSIS — Z0389 Encounter for observation for other suspected diseases and conditions ruled out: Secondary | ICD-10-CM

## 2019-07-22 DIAGNOSIS — F1210 Cannabis abuse, uncomplicated: Secondary | ICD-10-CM

## 2019-07-22 DIAGNOSIS — Z940 Kidney transplant status: Principal | ICD-10-CM

## 2019-07-22 DIAGNOSIS — Z6841 Body Mass Index (BMI) 40.0 and over, adult: Secondary | ICD-10-CM

## 2019-07-23 ENCOUNTER — Institutional Professional Consult (permissible substitution): Admit: 2019-07-23 | Discharge: 2019-07-24 | Payer: MEDICARE | Attending: Family | Primary: Family

## 2019-07-23 DIAGNOSIS — Z794 Long term (current) use of insulin: Secondary | ICD-10-CM

## 2019-07-23 DIAGNOSIS — Z6841 Body Mass Index (BMI) 40.0 and over, adult: Secondary | ICD-10-CM

## 2019-07-23 DIAGNOSIS — E1122 Type 2 diabetes mellitus with diabetic chronic kidney disease: Secondary | ICD-10-CM

## 2019-07-23 DIAGNOSIS — F1210 Cannabis abuse, uncomplicated: Principal | ICD-10-CM

## 2019-07-23 DIAGNOSIS — Z7189 Other specified counseling: Secondary | ICD-10-CM

## 2019-07-23 DIAGNOSIS — Z0389 Encounter for observation for other suspected diseases and conditions ruled out: Secondary | ICD-10-CM

## 2019-07-23 DIAGNOSIS — G4733 Obstructive sleep apnea (adult) (pediatric): Secondary | ICD-10-CM

## 2019-07-23 MED ORDER — CLONIDINE HCL 0.3 MG TABLET
ORAL_TABLET | Freq: Two times a day (BID) | ORAL | 11 refills | 30.00000 days | Status: CP
Start: 2019-07-23 — End: 2020-07-22

## 2019-07-23 MED ORDER — CITALOPRAM 20 MG TABLET
ORAL_TABLET | Freq: Every day | ORAL | 11 refills | 30 days | Status: CP
Start: 2019-07-23 — End: 2020-07-22

## 2019-08-12 ENCOUNTER — Ambulatory Visit: Admit: 2019-08-12 | Discharge: 2019-08-13 | Payer: MEDICARE

## 2019-08-12 DIAGNOSIS — Z0389 Encounter for observation for other suspected diseases and conditions ruled out: Secondary | ICD-10-CM

## 2019-08-12 DIAGNOSIS — Z940 Kidney transplant status: Principal | ICD-10-CM

## 2019-08-12 DIAGNOSIS — R799 Abnormal finding of blood chemistry, unspecified: Secondary | ICD-10-CM

## 2019-08-12 DIAGNOSIS — Z0000 Encounter for general adult medical examination without abnormal findings: Secondary | ICD-10-CM

## 2019-08-27 ENCOUNTER — Telehealth: Admit: 2019-08-27 | Discharge: 2019-08-27 | Payer: MEDICARE | Attending: Nephrology | Primary: Nephrology

## 2019-08-27 DIAGNOSIS — Z6841 Body Mass Index (BMI) 40.0 and over, adult: Secondary | ICD-10-CM

## 2019-08-27 DIAGNOSIS — I10 Essential (primary) hypertension: Secondary | ICD-10-CM

## 2019-08-27 DIAGNOSIS — R801 Persistent proteinuria, unspecified: Secondary | ICD-10-CM

## 2019-08-27 DIAGNOSIS — T8619 Other complication of kidney transplant: Secondary | ICD-10-CM

## 2019-08-27 DIAGNOSIS — Z940 Kidney transplant status: Principal | ICD-10-CM

## 2019-08-27 DIAGNOSIS — D899 Disorder involving the immune mechanism, unspecified: Secondary | ICD-10-CM

## 2019-08-27 DIAGNOSIS — N059 Unspecified nephritic syndrome with unspecified morphologic changes: Secondary | ICD-10-CM

## 2019-09-18 DIAGNOSIS — Z940 Kidney transplant status: Secondary | ICD-10-CM

## 2019-09-19 MED ORDER — TACROLIMUS 1 MG CAPSULE
ORAL_CAPSULE | ORAL | 3 refills | 90 days | Status: CP
Start: 2019-09-19 — End: 2019-09-20

## 2019-09-20 ENCOUNTER — Ambulatory Visit: Admit: 2019-09-20 | Discharge: 2019-09-21 | Payer: MEDICARE

## 2019-09-20 DIAGNOSIS — Z940 Kidney transplant status: Secondary | ICD-10-CM

## 2019-09-20 DIAGNOSIS — Z0389 Encounter for observation for other suspected diseases and conditions ruled out: Secondary | ICD-10-CM

## 2019-09-20 DIAGNOSIS — R799 Abnormal finding of blood chemistry, unspecified: Secondary | ICD-10-CM

## 2019-09-20 MED ORDER — TACROLIMUS 1 MG CAPSULE
ORAL_CAPSULE | Freq: Every day | ORAL | 3 refills | 90.00000 days | Status: CP
Start: 2019-09-20 — End: 2019-10-12

## 2019-09-22 DIAGNOSIS — E1122 Type 2 diabetes mellitus with diabetic chronic kidney disease: Secondary | ICD-10-CM | POA: Insufficient documentation

## 2019-09-27 ENCOUNTER — Ambulatory Visit: Admit: 2019-09-27 | Discharge: 2019-09-28 | Payer: MEDICARE | Attending: Retina Specialist | Primary: Retina Specialist

## 2019-09-27 DIAGNOSIS — H40001 Preglaucoma, unspecified, right eye: Secondary | ICD-10-CM

## 2019-09-27 DIAGNOSIS — E1169 Type 2 diabetes mellitus with other specified complication: Secondary | ICD-10-CM

## 2019-09-27 DIAGNOSIS — Z794 Long term (current) use of insulin: Secondary | ICD-10-CM

## 2019-09-27 DIAGNOSIS — E119 Type 2 diabetes mellitus without complications: Secondary | ICD-10-CM

## 2019-09-27 DIAGNOSIS — Z940 Kidney transplant status: Secondary | ICD-10-CM

## 2019-09-28 DIAGNOSIS — N186 End stage renal disease: Secondary | ICD-10-CM

## 2019-09-29 DIAGNOSIS — Z940 Kidney transplant status: Secondary | ICD-10-CM

## 2019-09-29 DIAGNOSIS — R52 Pain, unspecified: Secondary | ICD-10-CM

## 2019-09-30 ENCOUNTER — Ambulatory Visit: Admit: 2019-09-30 | Discharge: 2019-10-01 | Payer: MEDICARE

## 2019-09-30 DIAGNOSIS — Z794 Long term (current) use of insulin: Secondary | ICD-10-CM

## 2019-09-30 DIAGNOSIS — N186 End stage renal disease: Secondary | ICD-10-CM

## 2019-09-30 DIAGNOSIS — Z8673 Personal history of transient ischemic attack (TIA), and cerebral infarction without residual deficits: Secondary | ICD-10-CM

## 2019-09-30 DIAGNOSIS — Z940 Kidney transplant status: Secondary | ICD-10-CM

## 2019-09-30 DIAGNOSIS — Z79899 Other long term (current) drug therapy: Secondary | ICD-10-CM

## 2019-09-30 DIAGNOSIS — R52 Pain, unspecified: Secondary | ICD-10-CM

## 2019-09-30 DIAGNOSIS — E1122 Type 2 diabetes mellitus with diabetic chronic kidney disease: Secondary | ICD-10-CM

## 2019-09-30 DIAGNOSIS — G4733 Obstructive sleep apnea (adult) (pediatric): Secondary | ICD-10-CM

## 2019-09-30 DIAGNOSIS — Z6841 Body Mass Index (BMI) 40.0 and over, adult: Secondary | ICD-10-CM

## 2019-09-30 DIAGNOSIS — E785 Hyperlipidemia, unspecified: Secondary | ICD-10-CM

## 2019-09-30 DIAGNOSIS — Z888 Allergy status to other drugs, medicaments and biological substances status: Secondary | ICD-10-CM

## 2019-09-30 DIAGNOSIS — I120 Hypertensive chronic kidney disease with stage 5 chronic kidney disease or end stage renal disease: Secondary | ICD-10-CM

## 2019-09-30 DIAGNOSIS — Z87891 Personal history of nicotine dependence: Secondary | ICD-10-CM

## 2019-10-01 ENCOUNTER — Ambulatory Visit: Admit: 2019-10-01 | Discharge: 2019-10-01 | Payer: MEDICARE

## 2019-10-01 ENCOUNTER — Ambulatory Visit: Admit: 2019-10-01 | Discharge: 2019-10-01 | Payer: MEDICARE | Attending: Nephrology | Primary: Nephrology

## 2019-10-01 DIAGNOSIS — Z0000 Encounter for general adult medical examination without abnormal findings: Secondary | ICD-10-CM

## 2019-10-01 DIAGNOSIS — D899 Disorder involving the immune mechanism, unspecified: Secondary | ICD-10-CM

## 2019-10-01 DIAGNOSIS — Z940 Kidney transplant status: Secondary | ICD-10-CM

## 2019-10-01 DIAGNOSIS — I10 Essential (primary) hypertension: Secondary | ICD-10-CM

## 2019-10-01 DIAGNOSIS — T8619 Other complication of kidney transplant: Secondary | ICD-10-CM

## 2019-10-01 DIAGNOSIS — T8611 Kidney transplant rejection: Secondary | ICD-10-CM

## 2019-10-01 DIAGNOSIS — D508 Other iron deficiency anemias: Secondary | ICD-10-CM

## 2019-10-01 DIAGNOSIS — Z6841 Body Mass Index (BMI) 40.0 and over, adult: Secondary | ICD-10-CM

## 2019-10-01 DIAGNOSIS — N059 Unspecified nephritic syndrome with unspecified morphologic changes: Secondary | ICD-10-CM

## 2019-10-01 MED ORDER — CHLORTHALIDONE 25 MG TABLET
ORAL_TABLET | Freq: Every morning | ORAL | 11 refills | 30 days | Status: CP
Start: 2019-10-01 — End: 2020-09-30

## 2019-10-04 DIAGNOSIS — D899 Disorder involving the immune mechanism, unspecified: Secondary | ICD-10-CM

## 2019-10-04 DIAGNOSIS — Z940 Kidney transplant status: Secondary | ICD-10-CM

## 2019-10-11 DIAGNOSIS — Z940 Kidney transplant status: Principal | ICD-10-CM

## 2019-10-12 DIAGNOSIS — Z940 Kidney transplant status: Principal | ICD-10-CM

## 2019-10-12 MED ORDER — TACROLIMUS 1 MG CAPSULE: 2 mg | capsule | Freq: Two times a day (BID) | 3 refills | 90 days | Status: AC

## 2019-10-12 MED ORDER — TACROLIMUS 1 MG CAPSULE: 2 mg | capsule | Freq: Every day | 11 refills | 30 days | Status: AC

## 2019-10-13 DIAGNOSIS — Z6841 Body Mass Index (BMI) 40.0 and over, adult: Principal | ICD-10-CM

## 2019-10-13 DIAGNOSIS — G4733 Obstructive sleep apnea (adult) (pediatric): Principal | ICD-10-CM

## 2019-10-13 DIAGNOSIS — E1122 Type 2 diabetes mellitus with diabetic chronic kidney disease: Principal | ICD-10-CM

## 2019-10-13 DIAGNOSIS — Z419 Encounter for procedure for purposes other than remedying health state, unspecified: Principal | ICD-10-CM

## 2019-10-13 DIAGNOSIS — Z794 Long term (current) use of insulin: Secondary | ICD-10-CM

## 2019-10-13 DIAGNOSIS — K219 Gastro-esophageal reflux disease without esophagitis: Principal | ICD-10-CM

## 2019-10-13 DIAGNOSIS — F329 Major depressive disorder, single episode, unspecified: Principal | ICD-10-CM

## 2019-10-13 DIAGNOSIS — Z940 Kidney transplant status: Principal | ICD-10-CM

## 2019-10-13 DIAGNOSIS — I10 Essential (primary) hypertension: Principal | ICD-10-CM

## 2019-10-13 DIAGNOSIS — Z01818 Encounter for other preprocedural examination: Principal | ICD-10-CM

## 2019-10-13 MED ORDER — TACROLIMUS 1 MG CAPSULE: 2 mg | capsule | Freq: Two times a day (BID) | 3 refills | 90 days | Status: AC

## 2019-10-15 ENCOUNTER — Encounter
Admit: 2019-10-15 | Discharge: 2019-10-17 | Disposition: A | Discharge status: Home / Self Care | Payer: MEDICARE | Attending: Certified Registered" | Admitting: Student in an Organized Health Care Education/Training Program

## 2019-10-15 ENCOUNTER — Ambulatory Visit
Admit: 2019-10-15 | Discharge: 2019-10-17 | Disposition: A | Discharge status: Home / Self Care | Payer: MEDICARE | Admitting: Student in an Organized Health Care Education/Training Program

## 2019-10-15 ENCOUNTER — Ambulatory Visit
Admit: 2019-10-15 | Discharge: 2019-10-17 | Disposition: A | Discharge status: Home / Self Care | Payer: MEDICARE | Attending: Family | Admitting: Student in an Organized Health Care Education/Training Program

## 2019-10-15 DIAGNOSIS — F419 Anxiety disorder, unspecified: Principal | ICD-10-CM

## 2019-10-15 MED ORDER — CITALOPRAM 10 MG TABLET: 20 mg | tablet | Freq: Every day | 3 refills | 90 days | Status: AC

## 2019-10-15 MED ORDER — OXYCODONE 5 MG/5 ML ORAL SOLUTION: 5 mg | mL | 0 refills | 2 days | Status: AC

## 2019-10-17 MED ORDER — OMEPRAZOLE 20 MG CAPSULE,DELAYED RELEASE: 20 mg | capsule | Freq: Every day | 0 refills | 30 days | Status: AC

## 2019-10-17 MED ORDER — ONDANSETRON 4 MG DISINTEGRATING TABLET: 4 mg | tablet | Freq: Three times a day (TID) | 0 refills | 10 days | Status: AC

## 2019-10-17 MED ORDER — OXYCODONE 5 MG/5 ML ORAL SOLUTION: 5 mg | mL | 0 refills | 5 days | Status: AC

## 2019-10-17 MED FILL — ONDANSETRON 4 MG DISINTEGRATING TABLET: 10 days supply | Qty: 30 | Fill #0 | Status: AC

## 2019-10-17 MED FILL — ONDANSETRON 4 MG DISINTEGRATING TABLET: ORAL | 10 days supply | Qty: 30 | Fill #0

## 2019-10-17 MED FILL — OXYCODONE 5 MG/5 ML ORAL SOLUTION: 5 days supply | Qty: 150 | Fill #0 | Status: AC

## 2019-10-17 MED FILL — OXYCODONE 5 MG/5 ML ORAL SOLUTION: ORAL | 5 days supply | Qty: 150 | Fill #0

## 2019-10-18 DIAGNOSIS — Z940 Kidney transplant status: Principal | ICD-10-CM

## 2019-10-18 DIAGNOSIS — D899 Disorder involving the immune mechanism, unspecified: Principal | ICD-10-CM

## 2019-10-26 ENCOUNTER — Institutional Professional Consult (permissible substitution): Admit: 2019-10-26 | Discharge: 2019-10-27 | Payer: MEDICARE

## 2019-10-28 ENCOUNTER — Ambulatory Visit
Admit: 2019-10-28 | Discharge: 2019-11-01 | Disposition: A | Discharge status: Home / Self Care | Payer: MEDICARE | Admitting: Nephrology

## 2019-11-01 DIAGNOSIS — Z940 Kidney transplant status: Principal | ICD-10-CM

## 2019-11-01 DIAGNOSIS — D899 Disorder involving the immune mechanism, unspecified: Principal | ICD-10-CM

## 2019-11-01 MED ORDER — LOSARTAN 25 MG TABLET
ORAL_TABLET | Freq: Every day | ORAL | 0 refills | 30.00000 days | Status: CP
Start: 2019-11-01 — End: 2019-12-01

## 2019-11-01 MED ORDER — CEFDINIR 300 MG CAPSULE
ORAL_CAPSULE | Freq: Every day | ORAL | 0 refills | 6.00000 days | Status: CP
Start: 2019-11-01 — End: ?

## 2019-11-02 MED ORDER — ASPIRIN 81 MG CHEWABLE TABLET
ORAL_TABLET | Freq: Every day | ORAL | 0 refills | 30.00000 days | Status: CP
Start: 2019-11-02 — End: 2019-12-02

## 2019-11-05 DIAGNOSIS — Z940 Kidney transplant status: Principal | ICD-10-CM

## 2019-11-05 MED ORDER — CELLCEPT 250 MG CAPSULE
ORAL_CAPSULE | Freq: Two times a day (BID) | ORAL | 3 refills | 90 days | Status: CP
Start: 2019-11-05 — End: 2020-11-04

## 2019-11-05 NOTE — Unmapped (Signed)
[facility] Specialty Medication Referral: No PA required    Medication (Brand/Generic): Cellcept 250 mg capsules    Initial Benefits Investigation Claim completed with resulted information below:  No PA required  Patient ABLE to fill at [facility]  Insurance Company:  Prt B  Anticipated Copay: $9.77    As Co-pay is under $25 defined limit, per policy there will be no further investigation of need for financial assistance at this time unless patient requests. This referral has been communicated to the provider and handed off to the [facility] team for further processing and filling of prescribed medication.   ______________________________________________________________________  Please utilize this referral for viewing purposes as it will serve as the central location for all relevant documentation and updates.

## 2019-11-05 NOTE — Unmapped (Signed)
11/9 update: patient will get cellcept from [facility] for rest of this year. Starting in January, he wants to get all meds from local [facility] in [place]. As part b rx, new rx will need to be sent to that pharmacy. I will message clinic today. Disenrolling patient from our calls.-ef    [facility]   Patient Onboarding/Medication Counseling    Joshua Merritt is a 38 y.o. male with kidney transplant who I am counseling today on continuation of therapy.  I am speaking to the patient.    Verified patient's date of birth / HIPAA.    Specialty medication(s) to be sent: na      Non-specialty medications/supplies to be sent: na      Medications not needed at this time: na         Cellcept (mycopheonlate mofetil)    Medication & Administration     Dosage: Take 1 capsule (250mg ) by mouth twice daily    Administration: Take by mouth with or without food. Taking with food can minimize GI side effects. Swallow capsules whole, do not crush or chew.    Adherence/Missed dose instructions:  Take a missed dose as soon as you remember it . If it is close to the time of your next dose, skip the missed dose and resume your normal schedule.Never take 2 doses to try and catch up from a missed dose.    Goals of Therapy     Prevent organ rejection    Side Effects & Monitoring Parameters     ? Feeling tired or weak  ? Shakiness  ? Trouble sleeping  ? Diarrhea, abdominal pain, nausea, vomiting, constipation or decreased appetite  ? Decreases in blood counts   ? Back or joint pain  ? Hypertension or hypotension  ? High blood sugar  ? Headache  ? Skin rash    The following side effects should be reported to the provider: ? Reduced immune function ??? report signs of infection such as fever; chills; body aches; very bad sore throat; ear or sinus pain; cough; more sputum or change in color of sputum; pain with passing urine; wound that will not heal, etc.  Also at a slightly higher risk of some malignancies (mainly skin and blood cancers) due to this reduced immune function.  ? Allergic reaction (rash, hives, swelling, shortness of breath)  ? High blood sugar (confusion, feeling sleepy, more thirst, more hungry, passing urine more often, flushing, fast breathing, or breath that smells like fruit)  ? Electrolyte issues (mood changes, confusion, muscle pain or weakness, a heartbeat that does not feel normal, seizures, not hungry, or very bad upset stomach or throwing up)  ? High or low blood pressure (bad headache or dizziness, passing out, or change in eyesight)  ? Kidney issues (unable to pass urine, change in how much urine is passed, blood in the urine, or a big weight gain)  ? Skin (oozing, heat, swelling, redness, or pain), UTI and other infections   ? Chest pain or pressure  ? Abnormal heartbeat  ? Unexplained bleeding or bruising  ? Abnormal burning, numbness, or tingling  ? Muscle cramps,  ? Yellowing of skin or eyes    Monitoring parameters  ? Pregnancy   ? CBC   ? Renal and hepatic function    Contraindications, Warnings, & Precautions     ? *This is a REMS drug and an FDA-approved patient medication guide will be printed with each dispensation  ? Black  Box Warning: Infections   ? Black Box Warning: Lymphoproliferative disorders - risk of development of lymphoma and skin malignancy is increased  ? Black Box Warning: Use during pregnancy is associated with increased risks of first trimester pregnancy loss and congenital malformations.   ? Black Box Warning: Females of reproductive potential should use contraception during treatment and for 6 weeks after therapy is discontinued  ? CNS depression ? New or reactivated viral infections  ? Neutropenia  ? Male patients and/or their male partners should use effective contraception during treatment of the male patient and for at least 3 months after last dose.  ? Breastfeeding is not recommended during therapy and for 6 weeks after last dose    Drug/Food Interactions     ? Medication list reviewed in Epic. The patient was instructed to inform the care team before taking any new medications or supplements. no interactions noted that clinic is not already monitoring.   ? Separate doses of antacids and this medication  ? Check with your doctor before getting any vaccinations    Storage, Handling Precautions, & Disposal     ? Store at room temperature in a dry place  ? This medication is considered hazardous. Wash hands after handling and store out of reach or others, including children and pets.        Current Medications (including OTC/herbals), Comorbidities and Allergies     Current Outpatient Medications   Medication Sig Dispense Refill   ??? amLODIPine (NORVASC) 10 MG tablet Take 1 tablet (10 mg total) by mouth daily. 90 tablet 3   ??? aspirin 81 MG chewable tablet Chew 1 tablet (81 mg total) daily. 30 tablet 0   ??? carvediloL (COREG) 25 MG tablet Take 1 tablet (25 mg total) by mouth Two (2) times a day. 180 tablet 3   ??? cefdinir (OMNICEF) 300 MG capsule Take 1 capsule (300 mg total) by mouth daily. 6 capsule 0   ??? CELLCEPT 250 mg capsule Take 1 capsule (250 mg total) by mouth Two (2) times a day. 180 capsule 3   ??? citalopram (CELEXA) 10 MG tablet Take 2 tablets (20 mg total) by mouth daily. 180 tablet 3   ??? cloNIDine HCL (CATAPRES) 0.3 MG tablet Take 1 tablet (0.3 mg total) by mouth Two (2) times a day. 60 tablet 11   ??? ergocalciferol (VITAMIN D2) 1,250 mcg (50,000 unit) capsule Take 1 capsule (50,000 Units total) by mouth once a week. 12 capsule 3   ??? flash glucose sensor kit by Other route every fourteen (14) days. ??? fluticasone propionate (FLONASE) 50 mcg/actuation nasal spray 2 sprays into each nostril.     ??? losartan (COZAAR) 25 MG tablet Take 1 tablet (25 mg total) by mouth daily. 30 tablet 0   ??? magnesium oxide (MAGOX) 400 mg (241.3 mg magnesium) tablet Take 1 tablet (400 mg total) by mouth daily. 30 tablet 11   ??? omeprazole (PRILOSEC) 20 MG capsule Take 1 capsule (20 mg total) by mouth daily. 30 capsule 0   ??? omeprazole (PRILOSEC) 20 MG capsule Take 1 capsule (20 mg total) by mouth daily. 30 capsule 0   ??? ondansetron (ZOFRAN-ODT) 4 MG disintegrating tablet Take 1 tablet (4 mg total) by mouth every eight (8) hours as needed for nausea. 30 tablet 0   ??? pen needle, diabetic (BD ULTRA-FINE NANO PEN NEEDLES) 32 gauge x 5/32 Ndle 1 each by Miscellaneous route Four (4) times a day. 100 each 3   ??? predniSONE (  DELTASONE) 5 MG tablet TAKE 1 TABLET BY MOUTH ONCE DAILY 90 tablet 3   ??? rosuvastatin (CRESTOR) 5 MG tablet Take 5 mg by mouth daily.     ??? sodium bicarbonate 650 mg tablet Take 2 tablets (1,300 mg total) by mouth Two (2) times a day. 120 tablet 11   ??? tacrolimus (PROGRAF) 1 MG capsule Take 2 capsules (2 mg total) by mouth two (2) times a day. Z94.0; Kidney transplant 360 capsule 3   ??? traZODone (DESYREL) 50 MG tablet TAKE 2-3 TABLETS BY MOUTH EVERY NIGHT AT BEDTIME AS NEEDED FOR SLEEP 90 tablet 11   ??? VENTOLIN HFA 90 mcg/actuation inhaler        No current facility-administered medications for this visit.        Allergies   Allergen Reactions   ??? Thymoglobulin [Anti-Thymocyte Glob (Rabbit)]      Serum sickness rxn       Patient Active Problem List   Diagnosis   ??? Essential (primary) hypertension   ??? Metabolic acidosis   ??? 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 Alcohol withdrawal (CMS-HCC)   ??? Polysubstance abuse (CMS-HCC)   ??? AKI (acute kidney injury) (CMS-HCC)   ??? Sepsis (CMS-HCC)   ??? Influenza A   ??? Immunosuppression (CMS-HCC)   ??? Kidney replaced by transplant ??? Membranous glomerulonephritis with nephrosis   ??? Nephrotic syndrome   ??? Trauma   ??? Cellulitis of abdominal wall   ??? Leukocytosis   ??? Morbid obesity with BMI of 45.0-49.9, adult (CMS-HCC)   ??? Complete tear of right rotator cuff   ??? Morbid obesity due to excess calories (CMS-HCC)   ??? OSA (obstructive sleep apnea)   ??? Diabetes mellitus type 2 in obese (CMS-HCC)   ??? 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 Morbid obesity with BMI of 40.0-44.9, adult (CMS-HCC)   ??? Urinary tract infection       Reviewed and up to date in Epic.    Appropriateness of Therapy     Is medication and dose appropriate based on diagnosis? Yes    Baseline Quality of Life Assessment      How many days over the past month did your transplant keep you from your normal activities? 0    Financial Information     Medication Assistance provided: None Required    Anticipated copay of $9.77 for 30ds on part b reviewed with patient. Verified delivery address.    Delivery Information     Scheduled delivery date: na- pt is getting from [facility] until end of year and then [facility]    Expected start date: pt is currently already taking    Medication will be delivered via a to the na address in [place].  This shipment will require a signature.      Explained the services we provide at [facility] and that each month we would call to set up refills.  Stressed importance of returning phone calls so that we could ensure they receive their medications in time each month.  Informed patient that we should be setting up refills 7-10 days prior to when they will run out of medication.  A pharmacist will reach out to perform a clinical assessment periodically.  Informed patient that a welcome packet and a drug information handout will be sent. Patient verbalized understanding of the above information as well as how to contact the pharmacy at [phone]  option 4 with any questions/concerns.  The pharmacy is open Monday through Friday 8:30am-4:30pm.  A pharmacist is available 24/7 via pager to answer any clinical questions they may have.    Patient Specific Needs     ? Does the patient have any physical, cognitive, or cultural barriers? No    ? Patient prefers to have medications discussed with  Patient     ? Is the patient able to read and understand education materials at a high school level or above? Yes    ? Patient's primary language is  English     ? Is the patient high risk? Yes, patient taking a REMS drug     ? Does the patient require a Care Management Plan? No     ? Does the patient require physician intervention or other additional services (i.e. nutrition, smoking cessation, social work)? No      Thad Ranger  [facility] Specialty Pharmacist

## 2019-11-09 DIAGNOSIS — Z940 Kidney transplant status: Principal | ICD-10-CM

## 2019-11-09 MED ORDER — MYCOPHENOLATE MOFETIL 250 MG CAPSULE
ORAL_CAPSULE | Freq: Two times a day (BID) | ORAL | 3 refills | 90 days | Status: CP
Start: 2019-11-09 — End: 2020-11-08

## 2019-11-15 DIAGNOSIS — D899 Disorder involving the immune mechanism, unspecified: Principal | ICD-10-CM

## 2019-11-15 DIAGNOSIS — Z940 Kidney transplant status: Principal | ICD-10-CM

## 2019-11-29 ENCOUNTER — Ambulatory Visit: Admit: 2019-11-29 | Discharge: 2019-11-30 | Payer: MEDICARE

## 2019-12-01 ENCOUNTER — Institutional Professional Consult (permissible substitution): Admit: 2019-12-01 | Discharge: 2019-12-02 | Payer: MEDICARE | Attending: Registered" | Primary: Registered"

## 2019-12-01 DIAGNOSIS — Z9889 Other specified postprocedural states: Principal | ICD-10-CM

## 2019-12-01 DIAGNOSIS — N289 Disorder of kidney and ureter, unspecified: Principal | ICD-10-CM

## 2019-12-01 DIAGNOSIS — Z6841 Body Mass Index (BMI) 40.0 and over, adult: Principal | ICD-10-CM

## 2019-12-01 DIAGNOSIS — Z940 Kidney transplant status: Principal | ICD-10-CM

## 2019-12-11 ENCOUNTER — Ambulatory Visit: Admit: 2019-12-11 | Discharge: 2019-12-13 | Disposition: A | Discharge status: Home / Self Care | Payer: MEDICARE

## 2019-12-13 DIAGNOSIS — D899 Disorder involving the immune mechanism, unspecified: Principal | ICD-10-CM

## 2019-12-13 DIAGNOSIS — Z940 Kidney transplant status: Principal | ICD-10-CM

## 2019-12-27 DIAGNOSIS — Z940 Kidney transplant status: Principal | ICD-10-CM

## 2019-12-27 DIAGNOSIS — D899 Disorder involving the immune mechanism, unspecified: Principal | ICD-10-CM

## 2019-12-29 ENCOUNTER — Ambulatory Visit: Admit: 2019-12-29 | Discharge: 2019-12-29 | Payer: MEDICARE | Attending: Nephrology | Primary: Nephrology

## 2019-12-29 ENCOUNTER — Ambulatory Visit: Admit: 2019-12-29 | Discharge: 2019-12-29 | Payer: MEDICARE

## 2019-12-29 DIAGNOSIS — Z940 Kidney transplant status: Principal | ICD-10-CM

## 2019-12-29 DIAGNOSIS — N289 Disorder of kidney and ureter, unspecified: Principal | ICD-10-CM

## 2019-12-29 MED ORDER — LOSARTAN 25 MG TABLET
ORAL_TABLET | Freq: Every day | ORAL | 3 refills | 90 days | Status: CP
Start: 2019-12-29 — End: 2020-12-28

## 2020-01-04 DIAGNOSIS — Z940 Kidney transplant status: Principal | ICD-10-CM

## 2020-01-04 MED ORDER — MYCOPHENOLATE MOFETIL 250 MG CAPSULE
ORAL_CAPSULE | Freq: Two times a day (BID) | ORAL | 3 refills | 90.00000 days | Status: CP
Start: 2020-01-04 — End: 2021-01-03

## 2020-01-10 DIAGNOSIS — Z940 Kidney transplant status: Principal | ICD-10-CM

## 2020-01-10 DIAGNOSIS — D899 Disorder involving the immune mechanism, unspecified: Principal | ICD-10-CM

## 2020-01-12 ENCOUNTER — Ambulatory Visit: Admit: 2020-01-12 | Discharge: 2020-01-13 | Payer: MEDICARE

## 2020-01-24 DIAGNOSIS — D899 Disorder involving the immune mechanism, unspecified: Principal | ICD-10-CM

## 2020-01-24 DIAGNOSIS — Z940 Kidney transplant status: Principal | ICD-10-CM

## 2020-01-28 ENCOUNTER — Ambulatory Visit: Admit: 2020-01-28 | Discharge: 2020-01-29 | Payer: MEDICARE

## 2020-02-01 ENCOUNTER — Institutional Professional Consult (permissible substitution): Admit: 2020-02-01 | Discharge: 2020-02-02 | Payer: MEDICARE | Attending: Family | Primary: Family

## 2020-02-01 DIAGNOSIS — R634 Abnormal weight loss: Principal | ICD-10-CM

## 2020-02-01 DIAGNOSIS — Z6835 Body mass index (BMI) 35.0-35.9, adult: Principal | ICD-10-CM

## 2020-02-01 DIAGNOSIS — Z903 Acquired absence of stomach [part of]: Principal | ICD-10-CM

## 2020-02-01 DIAGNOSIS — R799 Abnormal finding of blood chemistry, unspecified: Principal | ICD-10-CM

## 2020-02-01 MED ORDER — URSODIOL 300 MG CAPSULE
ORAL_CAPSULE | Freq: Two times a day (BID) | ORAL | 3 refills | 30 days | Status: CP
Start: 2020-02-01 — End: 2020-07-30

## 2020-02-07 DIAGNOSIS — D899 Disorder involving the immune mechanism, unspecified: Principal | ICD-10-CM

## 2020-02-07 DIAGNOSIS — Z940 Kidney transplant status: Principal | ICD-10-CM

## 2020-02-11 DIAGNOSIS — Z940 Kidney transplant status: Principal | ICD-10-CM

## 2020-02-11 MED ORDER — OMEPRAZOLE 20 MG CAPSULE,DELAYED RELEASE
ORAL_CAPSULE | Freq: Every day | ORAL | 3 refills | 90 days | Status: CP
Start: 2020-02-11 — End: 2021-02-10

## 2020-02-14 ENCOUNTER — Ambulatory Visit: Admit: 2020-02-14 | Discharge: 2020-02-16 | Disposition: A | Discharge status: Home / Self Care | Payer: MEDICARE

## 2020-02-16 MED ORDER — LOSARTAN 25 MG TABLET
ORAL_TABLET | Freq: Two times a day (BID) | ORAL | 1 refills | 90.00000 days | Status: CP
Start: 2020-02-16 — End: ?

## 2020-02-16 MED FILL — CIPROFLOXACIN 500 MG TABLET: 4 days supply | Qty: 4 | Fill #0 | Status: AC

## 2020-02-17 MED ORDER — CIPROFLOXACIN 500 MG TABLET
ORAL_TABLET | ORAL | 0 refills | 4 days | Status: CP
Start: 2020-02-17 — End: 2020-02-21
  Filled 2020-02-16: qty 4, 4d supply, fill #0

## 2020-02-21 DIAGNOSIS — D899 Disorder involving the immune mechanism, unspecified: Principal | ICD-10-CM

## 2020-02-21 DIAGNOSIS — Z940 Kidney transplant status: Principal | ICD-10-CM

## 2020-02-28 ENCOUNTER — Ambulatory Visit: Admit: 2020-02-28 | Discharge: 2020-02-29 | Payer: MEDICARE

## 2020-03-03 MED ORDER — LOSARTAN 50 MG TABLET
ORAL_TABLET | Freq: Two times a day (BID) | ORAL | 11 refills | 30 days | Status: CP
Start: 2020-03-03 — End: 2021-03-03

## 2020-03-06 DIAGNOSIS — D899 Disorder involving the immune mechanism, unspecified: Principal | ICD-10-CM

## 2020-03-06 DIAGNOSIS — Z940 Kidney transplant status: Principal | ICD-10-CM

## 2020-03-11 ENCOUNTER — Ambulatory Visit: Payer: Medicare Other | Attending: Internal Medicine

## 2020-03-11 DIAGNOSIS — Z23 Encounter for immunization: Secondary | ICD-10-CM

## 2020-03-11 NOTE — Progress Notes (Signed)
   Covid-19 Vaccination Clinic  Name:  Eric Frey    MRN: [id number] DOB: December 25, 1981  03/11/2020  Mr. Eric Frey was observed post Covid-19 immunization for 15 minutes without incident. He was provided with Vaccine Information Sheet and instruction to access the V-Safe system.   Mr. Eric Frey was instructed to call 911 with any severe reactions post vaccine: Marland Kitchen Difficulty breathing  . Swelling of face and throat  . A fast heartbeat  . A bad rash all over body  . Dizziness and weakness   Immunizations Administered    Name Date Dose VIS Date Route   Pfizer COVID-19 Vaccine 03/11/2020 12:55 PM 0.3 mL 12/10/2019 Intramuscular   Manufacturer: [place]   Lot: [id number]   [place]: [id number]

## 2020-03-16 DIAGNOSIS — Z01818 Encounter for other preprocedural examination: Principal | ICD-10-CM

## 2020-03-16 DIAGNOSIS — N186 End stage renal disease: Principal | ICD-10-CM

## 2020-03-16 DIAGNOSIS — R93429 Abnormal radiologic findings on diagnostic imaging of unspecified kidney: Principal | ICD-10-CM

## 2020-03-16 DIAGNOSIS — Z01810 Encounter for preprocedural cardiovascular examination: Principal | ICD-10-CM

## 2020-03-20 DIAGNOSIS — Z940 Kidney transplant status: Principal | ICD-10-CM

## 2020-03-20 DIAGNOSIS — D899 Disorder involving the immune mechanism, unspecified: Principal | ICD-10-CM

## 2020-03-23 MED ORDER — ERGOCALCIFEROL (VITAMIN D2) 1,250 MCG (50,000 UNIT) CAPSULE
ORAL_CAPSULE | 3 refills | 0 days | Status: CP
Start: 2020-03-23 — End: ?

## 2020-03-27 ENCOUNTER — Ambulatory Visit: Admit: 2020-03-27 | Discharge: 2020-03-28 | Payer: MEDICARE

## 2020-04-03 ENCOUNTER — Ambulatory Visit: Admit: 2020-04-03 | Discharge: 2020-04-04 | Payer: MEDICARE

## 2020-04-03 DIAGNOSIS — D899 Disorder involving the immune mechanism, unspecified: Principal | ICD-10-CM

## 2020-04-03 DIAGNOSIS — Z940 Kidney transplant status: Principal | ICD-10-CM

## 2020-04-04 ENCOUNTER — Institutional Professional Consult (permissible substitution): Admit: 2020-04-04 | Discharge: 2020-04-05 | Payer: MEDICARE | Attending: Registered" | Primary: Registered"

## 2020-04-04 ENCOUNTER — Ambulatory Visit: Payer: Medicare Other | Attending: Internal Medicine

## 2020-04-04 DIAGNOSIS — Z23 Encounter for immunization: Secondary | ICD-10-CM

## 2020-04-04 NOTE — Progress Notes (Signed)
   Covid-19 Vaccination Clinic  Name:  Alberto Pina    MRN: [id number] DOB: 1981-07-23  04/04/2020  Mr. Gerad Cornelio was observed post Covid-19 immunization for 15 minutes without incident. He was provided with Vaccine Information Sheet and instruction to access the V-Safe system.   Mr. Sadler Teschner was instructed to call 911 with any severe reactions post vaccine: Marland Kitchen Difficulty breathing  . Swelling of face and throat  . A fast heartbeat  . A bad rash all over body  . Dizziness and weakness   Immunizations Administered    Name Date Dose VIS Date Route   Pfizer COVID-19 Vaccine 04/04/2020  1:34 PM 0.3 mL 12/10/2019 Intramuscular   Manufacturer: [place]   Lot: [id number]   [place]: [address]-8

## 2020-04-10 ENCOUNTER — Institutional Professional Consult (permissible substitution): Admit: 2020-04-10 | Discharge: 2020-04-10 | Payer: MEDICARE

## 2020-04-10 ENCOUNTER — Institutional Professional Consult (permissible substitution): Admit: 2020-04-10 | Discharge: 2020-04-10 | Payer: MEDICARE | Attending: Registered" | Primary: Registered"

## 2020-04-10 ENCOUNTER — Ambulatory Visit: Admit: 2020-04-10 | Discharge: 2020-04-10 | Payer: MEDICARE

## 2020-04-11 MED ORDER — PREDNISONE 5 MG TABLET
ORAL_TABLET | 3 refills | 0 days | Status: CP
Start: 2020-04-11 — End: ?

## 2020-04-13 MED ORDER — AMLODIPINE 10 MG TABLET
ORAL_TABLET | 3 refills | 0 days | Status: CP
Start: 2020-04-13 — End: ?

## 2020-04-17 ENCOUNTER — Institutional Professional Consult (permissible substitution): Admit: 2020-04-17 | Discharge: 2020-04-18 | Payer: MEDICARE

## 2020-04-17 DIAGNOSIS — Z940 Kidney transplant status: Principal | ICD-10-CM

## 2020-04-17 DIAGNOSIS — D849 Immunodeficiency, unspecified: Principal | ICD-10-CM

## 2020-04-20 MED ORDER — PREDNISONE 5 MG TABLET
ORAL_TABLET | 3 refills | 0 days | Status: CP
Start: 2020-04-20 — End: ?

## 2020-04-21 ENCOUNTER — Ambulatory Visit: Admit: 2020-04-21 | Discharge: 2020-04-21 | Payer: MEDICARE

## 2020-04-21 ENCOUNTER — Ambulatory Visit: Admit: 2020-04-21 | Discharge: 2020-04-21 | Payer: MEDICARE | Attending: Nephrology | Primary: Nephrology

## 2020-04-21 DIAGNOSIS — D849 Immunodeficiency, unspecified: Principal | ICD-10-CM

## 2020-04-21 DIAGNOSIS — T8619 Other complication of kidney transplant: Secondary | ICD-10-CM

## 2020-04-21 DIAGNOSIS — I10 Essential (primary) hypertension: Principal | ICD-10-CM

## 2020-04-21 DIAGNOSIS — N059 Unspecified nephritic syndrome with unspecified morphologic changes: Secondary | ICD-10-CM

## 2020-04-21 DIAGNOSIS — N289 Disorder of kidney and ureter, unspecified: Principal | ICD-10-CM

## 2020-04-21 DIAGNOSIS — Z940 Kidney transplant status: Principal | ICD-10-CM

## 2020-04-23 ENCOUNTER — Encounter: Payer: Self-pay | Admitting: Nurse Practitioner

## 2020-04-24 DIAGNOSIS — Z01818 Encounter for other preprocedural examination: Principal | ICD-10-CM

## 2020-04-24 DIAGNOSIS — N186 End stage renal disease: Principal | ICD-10-CM

## 2020-04-27 ENCOUNTER — Ambulatory Visit: Admit: 2020-04-27 | Discharge: 2020-04-28 | Payer: MEDICARE

## 2020-04-27 ENCOUNTER — Encounter: Payer: Self-pay | Admitting: Nurse Practitioner

## 2020-04-27 ENCOUNTER — Telehealth (INDEPENDENT_AMBULATORY_CARE_PROVIDER_SITE_OTHER): Payer: Medicare Other | Admitting: Nurse Practitioner

## 2020-04-27 DIAGNOSIS — Z940 Kidney transplant status: Secondary | ICD-10-CM

## 2020-04-27 DIAGNOSIS — D849 Immunodeficiency, unspecified: Secondary | ICD-10-CM | POA: Diagnosis not present

## 2020-04-27 DIAGNOSIS — Z7689 Persons encountering health services in other specified circumstances: Secondary | ICD-10-CM | POA: Diagnosis not present

## 2020-04-27 DIAGNOSIS — I10 Essential (primary) hypertension: Secondary | ICD-10-CM

## 2020-04-27 DIAGNOSIS — F411 Generalized anxiety disorder: Secondary | ICD-10-CM

## 2020-04-27 DIAGNOSIS — G4733 Obstructive sleep apnea (adult) (pediatric): Secondary | ICD-10-CM

## 2020-04-27 DIAGNOSIS — Z794 Long term (current) use of insulin: Secondary | ICD-10-CM

## 2020-04-27 DIAGNOSIS — E1169 Type 2 diabetes mellitus with other specified complication: Secondary | ICD-10-CM | POA: Insufficient documentation

## 2020-04-27 DIAGNOSIS — E1122 Type 2 diabetes mellitus with diabetic chronic kidney disease: Secondary | ICD-10-CM | POA: Diagnosis not present

## 2020-04-27 DIAGNOSIS — E785 Hyperlipidemia, unspecified: Secondary | ICD-10-CM

## 2020-04-27 DIAGNOSIS — Z9884 Bariatric surgery status: Secondary | ICD-10-CM

## 2020-04-27 DIAGNOSIS — E1159 Type 2 diabetes mellitus with other circulatory complications: Secondary | ICD-10-CM

## 2020-04-27 DIAGNOSIS — E1165 Type 2 diabetes mellitus with hyperglycemia: Secondary | ICD-10-CM

## 2020-04-27 DIAGNOSIS — R12 Heartburn: Secondary | ICD-10-CM

## 2020-04-27 DIAGNOSIS — N184 Chronic kidney disease, stage 4 (severe): Secondary | ICD-10-CM

## 2020-04-27 DIAGNOSIS — F1911 Other psychoactive substance abuse, in remission: Secondary | ICD-10-CM

## 2020-04-27 DIAGNOSIS — Z6841 Body Mass Index (BMI) 40.0 and over, adult: Secondary | ICD-10-CM

## 2020-04-27 DIAGNOSIS — I152 Hypertension secondary to endocrine disorders: Secondary | ICD-10-CM

## 2020-04-27 NOTE — Assessment & Plan Note (Signed)
Chronic, ongoing with history of transplant.  Followed by endocrinology and nephrology.  Recent A1C 7.1%.  Continue current medication regimen as prescribed by endocrinology (Levemir, Humalog, Actos).  Recommend continued monitoring of BS by [employer].  Focus on healthy diabetic diet.  Continue collaboration with endocrinology and nephrology.  Return to office in 3 months for follow-up and foot exam.

## 2020-04-27 NOTE — Assessment & Plan Note (Signed)
Chronic, ongoing.  Continue current medication regimen (Prilosec) and adjust as needed.  Obtain yearly Mag level.  Attempt reduction in future and if poorly tolerate continue regimen.

## 2020-04-27 NOTE — Assessment & Plan Note (Signed)
Recent bariatric surgery and has lost 70 pounds.  Recommended eating smaller high protein, low fat meals more frequently and exercising 30 mins a day 5 times a week with a goal of 10-15lb weight loss in the next 3 months. Patient voiced their understanding and motivation to adhere to these recommendations.

## 2020-04-27 NOTE — Patient Instructions (Signed)

## 2020-04-27 NOTE — Assessment & Plan Note (Signed)
Chronic, ongoing.  Continue collaboration with renal transplant team and nephrology at [facility].  Continue current medication regimen as prescribed by them.

## 2020-04-27 NOTE — Assessment & Plan Note (Signed)
Chronic, ongoing.  Continue current medication regimen (Crestor) and adjust dose as needed to meet goals.  Plan on follow-up in 3 months and will check lipid panel.

## 2020-04-27 NOTE — Assessment & Plan Note (Signed)
Transplant in February 2008, continue collaboration with nephrology and renal transplant team at [facility].

## 2020-04-27 NOTE — Progress Notes (Signed)
New Patient Office Visit  Subjective:  Patient ID: Eric Frey, male    DOB: 06/25/1981  Age: 39 y.o. MRN: [id number]  CC:  Chief Complaint  Patient presents with  . Establish Care    . This visit was completed via MyChart due to the restrictions of the COVID-19 pandemic. All issues as above were discussed and addressed. Physical exam was done as above through visual confirmation on MyChart. If it was felt that the patient should be evaluated in the office, they were directed there. The patient verbally consented to this visit. . Location of the patient: home . Location of the provider: home . Those involved with this call:  . Provider: Marnee Guarneri, DNP . CMA: Yvonna Alanis, CMA . Front Desk/Registration: Don Perking  . Time spent on call: 30 minutes with patient face to face via video conference. More than 50% of this time was spent in counseling and coordination of care. 20 minutes total spent in review of patient's record and preparation of their chart.  . I verified patient identity using two factors (patient name and date of birth). Patient consents verbally to being seen via telemedicine visit today.    HPI Eric Frey presents for new patient visit to establish care.  Introduced to Designer, jewellery role and practice setting.  All questions answered.  DIABETES Followed by Dr. Gabriel Carina and last seen on 12/27/2019.  Continues on Levemir 40 units nightly and Humalog 5 units before each meal.  His A1C at last visit was 7.1%.  He wears a [employer].  Has history of bariatric surgery (sleeve) in October 2020 -- performed at [facility] -- has lost 70 pounds. Hypoglycemic episodes:no Polydipsia/polyuria: no Visual disturbance: no Chest pain: no Paresthesias: no Glucose Monitoring: yes  Accucheck frequency: BID  Fasting glucose: mid-140's  Post prandial:  Evening:  Before meals: Taking Insulin?: yes  Long acting insulin: 40 units Levemir  Short acting  insulin: 5 units Humalog before meals Blood Pressure Monitoring: daily Retinal Examination: Up to Date Foot Exam: Not up to Date Pneumovax: Up to Date Influenza: Up to Date Aspirin: yes   CHRONIC KIDNEY DISEASE Followed by [facility] nephrology with last GFR 22.  Has history of a renal transplant in February of 2008, given one of his sister's kidneys. He last saw his nephrologist on 04/22/20.  Continues on Tacrolimus, Sodium Bicarbonate, and Cellcept. CKD status: stable Medications renally dose: yes Previous renal evaluation: yes Pneumovax:  Up to Date Influenza Vaccine:  Up to Date   HYPERTENSION / HYPERLIPIDEMIA Last visit with cardiology was in April 2018, Dr. Ubaldo Glassing.  Echo at that time noted EF 55% and normal LV and RV systolic function.  Recently had Losartan increased which has helped lower BP, this change was made by his nephrologist on 04/22/20 -- he also continues on Losartan 50 MG BID, Hygroton 25 MG daily, Carvedilol 25 MG BID, Amlodipine 10 MG daily, Clonidine 0.3 MG BID.  Takes Crestor for HLD.  Has sleep apnea and reports 100% CPAP use. Satisfied with current treatment? yes Duration of hypertension: chronic BP monitoring frequency: daily BP range: 130/70 range BP medication side effects: no Duration of hyperlipidemia: chronic Cholesterol medication side effects: no Cholesterol supplements: none Medication compliance: good compliance Aspirin: yes Recent stressors: no Recurrent headaches: no Visual changes: no Palpitations: no Dyspnea: no Chest pain: no Lower extremity edema: no Dizzy/lightheaded: no   ANXIETY/STRESS 3 years ago went rehab for cocaine and alcohol abuse. No use since that time.  He does take Trazodone 100-150 MG before bedtime as needed for sleep and Celexa 10 MG BID for mood. Duration:stable Anxious mood: no  Excessive worrying: no Irritability: no  Sweating: no Nausea: no Palpitations:no Hyperventilation: no Panic attacks: no Agoraphobia: no    Obscessions/compulsions: no Depressed mood: no Depression screen PHQ 2/9 04/27/2020  Decreased Interest 0  Down, Depressed, Hopeless 0  PHQ - 2 Score 0   Anhedonia: no Weight changes: no Insomnia: none Hypersomnia: no Fatigue/loss of energy: no Feelings of worthlessness: no Feelings of guilt: no Impaired concentration/indecisiveness: no Suicidal ideations: no  Crying spells: no Recent Stressors/Life Changes: no   Relationship problems: no   Family stress: no     Financial stress: no    Job stress: no    Recent death/loss: no  Past Medical History:  Diagnosis Date  . Anxiety   . Diabetes mellitus without complication ([place])   . Hyperlipidemia   . Hypertension   . Renal disorder    kidney transplant 2008  . Sleep apnea     Past Surgical History:  Procedure Laterality Date  . KIDNEY TRANSPLANT    . LAPAROSCOPIC GASTRIC SLEEVE RESECTION      Family History  Problem Relation Age of Onset  . Skin cancer Mother   . Lung cancer Maternal Grandfather     Social History   Socioeconomic History  . Marital status: Single    Spouse name: Not on file  . Number of children: Not on file  . Years of education: Not on file  . Highest education level: Not on file  Occupational History  . Not on file  Tobacco Use  . Smoking status: Former Research scientist (life sciences)  . Smokeless tobacco: Never Used  Substance and Sexual Activity  . Alcohol use: No  . Drug use: No  . Sexual activity: Yes  Other Topics Concern  . Not on file  Social History Narrative  . Not on file   Social Determinants of Health   Financial Resource Strain: Low Risk   . Difficulty of Paying Living Expenses: Not hard at all  Food Insecurity: No Food Insecurity  . Worried About Charity fundraiser in the Last Year: Never true  . Ran Out of Food in the Last Year: Never true  Transportation Needs: No Transportation Needs  . Lack of Transportation (Medical): No  . Lack of Transportation (Non-Medical): No  Physical  Activity: Insufficiently Active  . Days of Exercise per Week: 3 days  . Minutes of Exercise per Session: 30 min  Stress: No Stress Concern Present  . Feeling of Stress : Not at all  Social Connections: Somewhat Isolated  . Frequency of Communication with Friends and Family: Three times a week  . Frequency of Social Gatherings with Friends and Family: Three times a week  . Attends Religious Services: Never  . Active Member of Clubs or Organizations: No  . Attends Archivist Meetings: Never  . Marital Status: Living with partner  Intimate Partner Violence:   . Fear of Current or Ex-Partner:   . Emotionally Abused:   Marland Kitchen Physically Abused:   . Sexually Abused:     ROS Review of Systems  Constitutional: Negative for activity change, diaphoresis, fatigue and fever.  Respiratory: Negative for cough, chest tightness, shortness of breath and wheezing.   Cardiovascular: Negative for chest pain, palpitations and leg swelling.  Gastrointestinal: Negative.   Endocrine: Negative for polydipsia, polyphagia and polyuria.  Neurological: Negative.   Psychiatric/Behavioral: Negative.  Objective:   Today's Vitals: There were no vitals taken for this visit.  Physical Exam Vitals and nursing note reviewed.  Constitutional:      General: He is awake. He is not in acute distress.    Appearance: He is well-developed. He is obese. He is not ill-appearing.  HENT:     Head: Normocephalic.     Right Ear: Hearing normal. No drainage.     Left Ear: Hearing normal. No drainage.  Eyes:     General: Lids are normal.        Right eye: No discharge.        Left eye: No discharge.     Conjunctiva/sclera: Conjunctivae normal.  Pulmonary:     Effort: Pulmonary effort is normal. No accessory muscle usage or respiratory distress.  Musculoskeletal:     Cervical back: Normal range of motion.  Neurological:     Mental Status: He is alert and oriented to person, place, and time.  Psychiatric:         Mood and Affect: Mood normal.        Behavior: Behavior normal. Behavior is cooperative.        Thought Content: Thought content normal.        Judgment: Judgment normal.     Assessment & Plan:   Problem List Items Addressed This Visit      Cardiovascular and Mediastinum   Hypertension associated with diabetes ([place])    Chronic, ongoing.  Previously followed by cardiology, but at this time is being followed by nephrology at [facility].  Will continue current medication regimen as prescribed by them, recent changes made and this has helped lower his BP on readings at home.  Recommend continuing to check BP once to twice a day at home and documenting for provider.  Recent labs performed at [facility], unable to view at this time, patient needs to give permission.  Recommend focus on DASH diet at home.  Return in 3 months for follow-up in office.      Relevant Medications   cloNIDine (CATAPRES) 0.3 MG tablet   losartan (COZAAR) 50 MG tablet   rosuvastatin (CRESTOR) 5 MG tablet   chlorthalidone (HYGROTON) 25 MG tablet     Respiratory   OSA (obstructive sleep apnea)    Recommend continued use of CPAP 100% of the time.        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  Followed by endocrinology and nephrology.  Recent A1C 7.1%.  Continue current medication regimen as prescribed by endocrinology (Levemir, Humalog, Actos).  Recommend continued monitoring of BS by FreeStyle.  Focus on healthy diabetic diet.  Continue collaboration with endocrinology and nephrology.  Return to office in 3 months for follow-up and foot exam.      Relevant Medications   losartan (COZAAR) 50 MG tablet   rosuvastatin (CRESTOR) 5 MG tablet   Other Relevant Orders   Ambulatory referral to Endocrinology   Uncontrolled type 2 diabetes mellitus with hyperglycemia, with long-term current use of insulin (HCC)    Chronic, ongoing.  Followed by  endocrinology with recent A1C 7.1%.  Continue this collaboration and current medication regimen as prescribed by them (Levemir, Humalog, Actos).  Recommend continued monitoring of BS via FreeStyle regularly.  May benefit from CCM referral in future due to lengthy medication list and chronic conditions, as this time he wishes to defer this.  Requires referral back to endocrinology  due to insurance changes, this was placed.  Return in 3 months for follow-up and foot exam + lipid check.      Relevant Medications   losartan (COZAAR) 50 MG tablet   rosuvastatin (CRESTOR) 5 MG tablet   Hyperlipidemia associated with type 2 diabetes mellitus (HCC)    Chronic, ongoing.  Continue current medication regimen (Crestor) and adjust dose as needed to meet goals.  Plan on follow-up in 3 months and will check lipid panel.      Relevant Medications   losartan (COZAAR) 50 MG tablet   rosuvastatin (CRESTOR) 5 MG tablet     Other   Immunosuppression (HCC)    Chronic, ongoing.  Continue collaboration with renal transplant team and nephrology at [facility].  Continue current medication regimen as prescribed by them.      History of kidney transplant    Transplant in February 2008, continue collaboration with nephrology and renal transplant team at [facility].      Relevant Medications   mycophenolate (CELLCEPT) 500 MG tablet   Long-term insulin use (HCC)    Chronic, ongoing.  Followed by endocrinology, with recent A1C 7.1%.  Continue insulin regimen as prescribed by them and consider CCM referral in future if assistance required.      Morbid obesity with BMI of 40.0-44.9, adult [facility])    Recent bariatric surgery and has lost 70 pounds.  Recommended eating smaller high protein, low fat meals more frequently and exercising 30 mins a day 5 times a week with a goal of 10-15lb weight loss in the next 3 months. Patient voiced their understanding and motivation to adhere to these recommendations.       History of substance  abuse ([place])    History of cocaine and alcohol abuse, went to rehab last 3 years ago.  Recommend continued cessation of alcohol and cocaine use, especially due to underlying chronic illnesses, return to use of these substances could lead to poor outcomes.      Hx of bariatric surgery    October 2020, continue collaboration with [facility] weight management team.  Praised patient for 70 pounds weight loss.        Generalized anxiety disorder    Chronic, ongoing.  Denies SI/HI.  Continue current medication regimen, Celexa and Trazodone, and adjust as needed based on mood and symptoms + sleep pattern.  Return in 3 months for follow-up in office.        Relevant Medications   citalopram (CELEXA) 10 MG tablet   Heart burn    Chronic, ongoing.  Continue current medication regimen (Prilosec) and adjust as needed.  Obtain yearly Mag level.  Attempt reduction in future and if poorly tolerate continue regimen.       Other Visit Diagnoses    Encounter to establish care    -  Primary      Outpatient Encounter Medications as of 04/27/2020  Medication Sig  . acetaminophen (TYLENOL) 325 MG tablet Take 650 mg by mouth every 6 (six) hours as needed.  Marland Kitchen amLODipine (NORVASC) 10 MG tablet Take 1 tablet by mouth daily.  Marland Kitchen aspirin EC 81 MG tablet Take 81 mg by mouth daily.  . carvedilol (COREG) 25 MG tablet Take 25 mg by mouth 2 (two) times daily.   . chlorthalidone (HYGROTON) 25 MG tablet Take 25 mg by mouth daily.  . citalopram (CELEXA) 10 MG tablet Take 1 tablet by mouth in the morning and at bedtime.  . cloNIDine (CATAPRES) 0.3 MG tablet Take 0.3 mg  by mouth 2 (two) times daily.  . ferrous sulfate 325 (65 FE) MG tablet Take 1 tablet by mouth once a week.  . insulin detemir (LEVEMIR) 100 unit/ml SOLN Inject 50 Units into the skin at bedtime.  . insulin lispro (HUMALOG) 100 UNIT/ML KiwkPen Inject 15 Units into the skin 3 (three) times daily with meals.   Marland Kitchen losartan (COZAAR) 50 MG tablet Take 50 mg by mouth 2  (two) times daily.  . mycophenolate (CELLCEPT) 500 MG tablet Take 750 mg by mouth 2 (two) times daily.  Marland Kitchen omeprazole (PRILOSEC) 20 MG capsule Take 1 capsule by mouth daily.  . ondansetron (ZOFRAN ODT) 4 MG disintegrating tablet Take 1 tablet (4 mg total) by mouth every 8 (eight) hours as needed for nausea or vomiting.  . pioglitazone (ACTOS) 30 MG tablet Take 15 mg by mouth daily.   . predniSONE (DELTASONE) 5 MG tablet Take 5 mg by mouth daily.  . rosuvastatin (CRESTOR) 5 MG tablet Take 5 mg by mouth daily.  . sodium bicarbonate 650 MG tablet Take 1,300 mg by mouth 2 (two) times daily.  . tacrolimus (PROGRAF) 1 MG capsule Take by mouth. 2 capsules in the AM and 1 capsule in the PM  . traZODone (DESYREL) 50 MG tablet Take 100-150 mg by mouth at bedtime as needed for sleep.  . ursodiol (ACTIGALL) 300 MG capsule Take 300 mg by mouth 2 (two) times daily.  . Vitamin D, Ergocalciferol, (DRISDOL) 50000 units CAPS capsule Take 1 capsule by mouth once a week.  . [DISCONTINUED] cloNIDine (CATAPRES) 0.2 MG tablet Take 0.2 mg by mouth 2 (two) times daily.  . [DISCONTINUED] glimepiride (AMARYL) 2 MG tablet Take 4 tablets by mouth daily.   . [DISCONTINUED] losartan (COZAAR) 25 MG tablet Take 25 mg by mouth daily.  . [DISCONTINUED] MAGNESIUM-OXIDE 400 (241.3 Mg) MG tablet Take 400 mg by mouth daily.   No facility-administered encounter medications on file as of 04/27/2020.   I discussed the assessment and treatment plan with the patient. The patient was provided an opportunity to ask questions and all were answered. The patient agreed with the plan and demonstrated an understanding of the instructions.   The patient was advised to call back or seek an in-person evaluation if the symptoms worsen or if the condition fails to improve as anticipated.   I provided 30+ minutes of time during this encounter.  Follow-up: Return in about 3 months (around 07/27/2020) for T2DM, HTN/HLD, CKD, MOOD, GERD.   Venita Lick, NP

## 2020-04-27 NOTE — Assessment & Plan Note (Signed)
Chronic, ongoing.  Previously followed by cardiology, but at this time is being followed by nephrology at [facility].  Will continue current medication regimen as prescribed by them, recent changes made and this has helped lower his BP on readings at home.  Recommend continuing to check BP once to twice a day at home and documenting for provider.  Recent labs performed at [facility], unable to view at this time, patient needs to give permission.  Recommend focus on DASH diet at home.  Return in 3 months for follow-up in office.

## 2020-04-27 NOTE — Assessment & Plan Note (Signed)
Chronic, ongoing.  Denies SI/HI.  Continue current medication regimen, Celexa and Trazodone, and adjust as needed based on mood and symptoms + sleep pattern.  Return in 3 months for follow-up in office.

## 2020-04-27 NOTE — Assessment & Plan Note (Signed)
Chronic, ongoing.  Followed by endocrinology, with recent A1C 7.1%.  Continue insulin regimen as prescribed by them and consider CCM referral in future if assistance required.

## 2020-04-27 NOTE — Assessment & Plan Note (Signed)
History of cocaine and alcohol abuse, went to rehab last 3 years ago.  Recommend continued cessation of alcohol and cocaine use, especially due to underlying chronic illnesses, return to use of these substances could lead to poor outcomes.

## 2020-04-27 NOTE — Assessment & Plan Note (Signed)
Recommend continued use of CPAP 100% of the time.

## 2020-04-27 NOTE — Assessment & Plan Note (Addendum)
Chronic, ongoing.  Followed by endocrinology with recent A1C 7.1%.  Continue this collaboration and current medication regimen as prescribed by them (Levemir, Humalog, Actos).  Recommend continued monitoring of BS via FreeStyle regularly.  May benefit from CCM referral in future due to lengthy medication list and chronic conditions, as this time he wishes to defer this.  Requires referral back to endocrinology due to insurance changes, this was placed.  Return in 3 months for follow-up and foot exam + lipid check.

## 2020-04-27 NOTE — Assessment & Plan Note (Addendum)
October 2020, continue collaboration with [facility] weight management team.  Praised patient for 70 pounds weight loss.

## 2020-05-01 ENCOUNTER — Telehealth: Payer: Self-pay | Admitting: Nurse Practitioner

## 2020-05-01 ENCOUNTER — Encounter: Payer: Self-pay | Admitting: Nurse Practitioner

## 2020-05-01 NOTE — Telephone Encounter (Signed)
-----   Message from Venita Lick, NP sent at 04/27/2020  9:01 AM EDT ----- Needs 3 month follow-up

## 2020-05-01 NOTE — Telephone Encounter (Signed)
lvm to make this apt. Sent letter.

## 2020-05-08 DIAGNOSIS — Z940 Kidney transplant status: Principal | ICD-10-CM

## 2020-05-08 MED ORDER — OMEPRAZOLE 20 MG CAPSULE,DELAYED RELEASE
ORAL_CAPSULE | Freq: Every day | ORAL | 3 refills | 0.00000 days
Start: 2020-05-08 — End: 2021-05-08

## 2020-05-09 ENCOUNTER — Ambulatory Visit: Admit: 2020-05-09 | Discharge: 2020-05-22 | Payer: MEDICARE

## 2020-05-09 ENCOUNTER — Ambulatory Visit: Admit: 2020-05-09 | Discharge: 2020-05-10 | Payer: MEDICARE

## 2020-05-09 ENCOUNTER — Ambulatory Visit: Admit: 2020-05-09 | Discharge: 2020-05-22 | Payer: MEDICARE | Attending: Family | Primary: Family

## 2020-05-09 DIAGNOSIS — R634 Abnormal weight loss: Principal | ICD-10-CM

## 2020-05-09 DIAGNOSIS — R799 Abnormal finding of blood chemistry, unspecified: Principal | ICD-10-CM

## 2020-05-09 DIAGNOSIS — Z903 Acquired absence of stomach [part of]: Principal | ICD-10-CM

## 2020-05-09 DIAGNOSIS — Z1321 Encounter for screening for nutritional disorder: Principal | ICD-10-CM

## 2020-05-09 MED ORDER — OMEPRAZOLE 20 MG CAPSULE,DELAYED RELEASE
ORAL_CAPSULE | Freq: Every day | ORAL | 3 refills | 90.00000 days | Status: CP
Start: 2020-05-09 — End: 2021-05-09

## 2020-05-09 NOTE — Telephone Encounter (Signed)
Lvm to make this apt. 

## 2020-05-10 DIAGNOSIS — D849 Immunodeficiency, unspecified: Principal | ICD-10-CM

## 2020-05-10 DIAGNOSIS — I10 Essential (primary) hypertension: Principal | ICD-10-CM

## 2020-05-10 DIAGNOSIS — Z940 Kidney transplant status: Principal | ICD-10-CM

## 2020-05-10 MED ORDER — TACROLIMUS 1 MG CAPSULE, IMMEDIATE-RELEASE
ORAL_CAPSULE | Freq: Two times a day (BID) | ORAL | 11 refills | 30.00000 days
Start: 2020-05-10 — End: ?

## 2020-05-10 MED ORDER — LOSARTAN 50 MG TABLET
ORAL_TABLET | Freq: Two times a day (BID) | ORAL | 11 refills | 30 days | Status: CP
Start: 2020-05-10 — End: 2021-05-10

## 2020-05-12 NOTE — Telephone Encounter (Signed)
Lvm sent letter.

## 2020-05-16 DIAGNOSIS — N186 End stage renal disease: Principal | ICD-10-CM

## 2020-05-16 DIAGNOSIS — Z01818 Encounter for other preprocedural examination: Principal | ICD-10-CM

## 2020-05-16 DIAGNOSIS — E041 Nontoxic single thyroid nodule: Principal | ICD-10-CM

## 2020-05-17 MED ORDER — OMEPRAZOLE 20 MG CAPSULE,DELAYED RELEASE
ORAL_CAPSULE | 3 refills | 0 days | Status: CP
Start: 2020-05-17 — End: ?

## 2020-05-22 ENCOUNTER — Ambulatory Visit: Admit: 2020-05-22 | Discharge: 2020-05-22 | Payer: MEDICARE

## 2020-05-22 ENCOUNTER — Ambulatory Visit
Admit: 2020-05-22 | Discharge: 2020-05-22 | Payer: MEDICARE | Attending: Student in an Organized Health Care Education/Training Program | Primary: Student in an Organized Health Care Education/Training Program

## 2020-05-22 ENCOUNTER — Ambulatory Visit: Admit: 2020-05-22 | Discharge: 2020-05-23 | Payer: MEDICARE

## 2020-06-05 DIAGNOSIS — Z940 Kidney transplant status: Principal | ICD-10-CM

## 2020-06-05 DIAGNOSIS — D849 Immunodeficiency, unspecified: Principal | ICD-10-CM

## 2020-06-05 MED ORDER — TACROLIMUS 1 MG CAPSULE, IMMEDIATE-RELEASE
ORAL_CAPSULE | Freq: Two times a day (BID) | ORAL | 11 refills | 30 days | Status: CP
Start: 2020-06-05 — End: ?

## 2020-06-08 DIAGNOSIS — Z903 Acquired absence of stomach [part of]: Principal | ICD-10-CM

## 2020-06-08 DIAGNOSIS — Z6835 Body mass index (BMI) 35.0-35.9, adult: Principal | ICD-10-CM

## 2020-06-08 MED ORDER — URSODIOL 300 MG CAPSULE
ORAL_CAPSULE | 3 refills | 0 days
Start: 2020-06-08 — End: ?

## 2020-06-19 ENCOUNTER — Ambulatory Visit: Admit: 2020-06-19 | Discharge: 2020-06-20 | Payer: MEDICARE

## 2020-06-22 DIAGNOSIS — Z940 Kidney transplant status: Principal | ICD-10-CM

## 2020-06-22 MED ORDER — MYCOPHENOLATE MOFETIL 250 MG CAPSULE
ORAL_CAPSULE | Freq: Two times a day (BID) | ORAL | 3 refills | 90.00000 days | Status: CP
Start: 2020-06-22 — End: 2021-06-22

## 2020-06-30 ENCOUNTER — Telehealth: Payer: Self-pay

## 2020-06-30 ENCOUNTER — Ambulatory Visit: Payer: Medicare Other

## 2020-06-30 NOTE — Telephone Encounter (Signed)
This nurse attempted to contact patient in order to perform AWV. Called times three at 1510, 1515, and 1525. Voicemail was left for patient to call office in order to reschedule for another time.

## 2020-07-04 ENCOUNTER — Ambulatory Visit: Admit: 2020-07-04 | Discharge: 2020-07-05 | Payer: MEDICARE

## 2020-07-04 ENCOUNTER — Telehealth: Payer: Self-pay | Admitting: Nurse Practitioner

## 2020-07-04 NOTE — Telephone Encounter (Signed)
Copied from [place] [id number] [id number]. Topic: Medicare AWV >> Jul 04, 2020 10:00 AM Cher Nakai R wrote: Reason for CRM: Lmtcb to reschedule AWVS patient missed on July 2,2021 Need to be virtual and 45 Minutes

## 2020-07-04 NOTE — Telephone Encounter (Signed)
Copied from [place] [id number]. Topic: Medicare AWV >> Jul 04, 2020 10:00 AM Cher Nakai R wrote: Reason for CRM: Lmtcb to reschedule AWVS patient missed on July 2,2021 Needs to be virtual and 45 Minutes

## 2020-07-10 ENCOUNTER — Ambulatory Visit: Admit: 2020-07-10 | Discharge: 2020-07-11 | Payer: MEDICARE

## 2020-07-21 ENCOUNTER — Ambulatory Visit (INDEPENDENT_AMBULATORY_CARE_PROVIDER_SITE_OTHER): Payer: Medicare Other

## 2020-07-21 VITALS — Ht 71.0 in | Wt 240.0 lb

## 2020-07-21 DIAGNOSIS — Z0000 Encounter for general adult medical examination without abnormal findings: Secondary | ICD-10-CM | POA: Diagnosis not present

## 2020-07-21 NOTE — Progress Notes (Signed)
I connected with Leticia Clas today by telephone and verified that I am speaking with the correct person using two identifiers. Location patient: home Location provider: work Persons participating in the virtual visit: Wirt, Hemmerich LPN.   I discussed the limitations, risks, security and privacy concerns of performing an evaluation and management service by telephone and the availability of in person appointments. I also discussed with the patient that there may be a patient responsible charge related to this service. The patient expressed understanding and verbally consented to this telephonic visit.    Interactive audio and video telecommunications were attempted between this provider and patient, however failed, due to patient having technical difficulties OR patient did not have access to video capability.  We continued and completed visit with audio only.    Vital signs may be patient reported or missing.   Subjective:   Eric Frey is a 39 y.o. male who presents for Medicare Annual/Subsequent preventive examination.  Review of Systems     Cardiac Risk Factors include: diabetes mellitus;dyslipidemia;hypertension;male gender;obesity (BMI >30kg/m2)     Objective:    Today's Vitals   07/21/20 1427  Weight: (!) 240 lb (108.9 kg)  Height: 5\' 11"  (1.803 m)   Body mass index is 33.47 kg/m.  Advanced Directives 07/21/2020 08/10/2018 06/07/2018 05/06/2017  Does Patient Have a Medical Advance Directive? No No No No  Would patient like information on creating a medical advance directive? - No - Patient declined - No - Patient declined    Current Medications (verified) Outpatient Encounter Medications as of 07/21/2020  Medication Sig  . acetaminophen (TYLENOL) 325 MG tablet Take 650 mg by mouth every 6 (six) hours as needed.  Marland Kitchen amLODipine (NORVASC) 10 MG tablet Take 1 tablet by mouth daily.  Marland Kitchen aspirin EC 81 MG tablet Take 81 mg by mouth daily.  .  carvedilol (COREG) 25 MG tablet Take 25 mg by mouth 2 (two) times daily.   . chlorthalidone (HYGROTON) 25 MG tablet Take 25 mg by mouth daily.  . citalopram (CELEXA) 10 MG tablet Take 1 tablet by mouth in the morning and at bedtime.  . cloNIDine (CATAPRES) 0.3 MG tablet Take 0.3 mg by mouth 2 (two) times daily.  . ferrous sulfate 325 (65 FE) MG tablet Take 1 tablet by mouth once a week.  . insulin detemir (LEVEMIR) 100 unit/ml SOLN Inject 50 Units into the skin at bedtime.  . insulin lispro (HUMALOG) 100 UNIT/ML KiwkPen Inject 15 Units into the skin 3 (three) times daily with meals.   Marland Kitchen losartan (COZAAR) 50 MG tablet Take 50 mg by mouth 2 (two) times daily.  . mycophenolate (CELLCEPT) 500 MG tablet Take 750 mg by mouth 2 (two) times daily.  Marland Kitchen omeprazole (PRILOSEC) 20 MG capsule Take 1 capsule by mouth daily.  . ondansetron (ZOFRAN ODT) 4 MG disintegrating tablet Take 1 tablet (4 mg total) by mouth every 8 (eight) hours as needed for nausea or vomiting.  . pioglitazone (ACTOS) 30 MG tablet Take 15 mg by mouth daily.   . predniSONE (DELTASONE) 5 MG tablet Take 5 mg by mouth daily.  . rosuvastatin (CRESTOR) 5 MG tablet Take 5 mg by mouth daily.  . sodium bicarbonate 650 MG tablet Take 1,300 mg by mouth 2 (two) times daily.  . tacrolimus (PROGRAF) 1 MG capsule Take by mouth. 2 capsules in the AM and 1 capsule in the PM  . traZODone (DESYREL) 50 MG tablet Take 100-150 mg by mouth at  bedtime as needed for sleep.  . Vitamin D, Ergocalciferol, (DRISDOL) 50000 units CAPS capsule Take 1 capsule by mouth once a week.  . ursodiol (ACTIGALL) 300 MG capsule Take 300 mg by mouth 2 (two) times daily. (Patient not taking: Reported on 07/21/2020)   No facility-administered encounter medications on file as of 07/21/2020.    Allergies (verified) Patient has no known allergies.   History: Past Medical History:  Diagnosis Date  . Anxiety   . Diabetes mellitus without complication ([place])   . Hyperlipidemia   .  Hypertension   . Renal disorder    kidney transplant 2008  . Sleep apnea    Past Surgical History:  Procedure Laterality Date  . KIDNEY TRANSPLANT    . LAPAROSCOPIC GASTRIC SLEEVE RESECTION     Family History  Problem Relation Age of Onset  . Skin cancer Mother   . Lung cancer Maternal Grandfather    Social History   Socioeconomic History  . Marital status: Single    Spouse name: Not on file  . Number of children: Not on file  . Years of education: Not on file  . Highest education level: Not on file  Occupational History  . Not on file  Tobacco Use  . Smoking status: Former Research scientist (life sciences)  . Smokeless tobacco: Never Used  Vaping Use  . Vaping Use: Never used  Substance and Sexual Activity  . Alcohol use: No  . Drug use: No  . Sexual activity: Yes  Other Topics Concern  . Not on file  Social History Narrative  . Not on file   Social Determinants of Health   Financial Resource Strain: Low Risk   . Difficulty of Paying Living Expenses: Not hard at all  Food Insecurity: No Food Insecurity  . Worried About Charity fundraiser in the Last Year: Never true  . Ran Out of Food in the Last Year: Never true  Transportation Needs: No Transportation Needs  . Lack of Transportation (Medical): No  . Lack of Transportation (Non-Medical): No  Physical Activity: Insufficiently Active  . Days of Exercise per Week: 4 days  . Minutes of Exercise per Session: 30 min  Stress: No Stress Concern Present  . Feeling of Stress : Only a little  Social Connections: Moderately Isolated  . Frequency of Communication with Friends and Family: Three times a week  . Frequency of Social Gatherings with Friends and Family: Three times a week  . Attends Religious Services: Never  . Active Member of Clubs or Organizations: No  . Attends Archivist Meetings: Never  . Marital Status: Living with partner    Tobacco Counseling Counseling given: Not Answered   Clinical Intake:  Pre-visit  preparation completed: Yes  Pain : No/denies pain     Nutritional Status: BMI > 30  Obese Nutritional Risks: None Diabetes: Yes  How often do you need to have someone help you when you read instructions, pamphlets, or other written materials from your doctor or pharmacy?: 1 - Never What is the last grade level you completed in school?: community college  Diabetic? Yes Nutrition Risk Assessment:  Has the patient had any N/V/D within the last 2 months?  No  Does the patient have any non-healing wounds?  No  Has the patient had any unintentional weight loss or weight gain?  No   Diabetes:  Is the patient diabetic?  Yes  If diabetic, was a CBG obtained today?  No  Did the patient bring in their  glucometer from home?  No  How often do you monitor your CBG's? Checks every 3-4 hours.   Financial Strains and Diabetes Management:  Are you having any financial strains with the device, your supplies or your medication? No .  Does the patient want to be seen by Chronic Care Management for management of their diabetes?  No  Would the patient like to be referred to a Nutritionist or for Diabetic Management?  No   Diabetic Exams:  Diabetic Eye Exam: Completed 01/2020 Diabetic Foot Exam: Completed 03/2020   Interpreter Needed?: No  Information entered by :: NAllen LPN   Activities of Daily Living In your present state of health, do you have any difficulty performing the following activities: 07/21/2020 04/27/2020  Hearing? N N  Vision? N N  Difficulty concentrating or making decisions? N N  Walking or climbing stairs? N N  Dressing or bathing? N N  Doing errands, shopping? N N  Preparing Food and eating ? N -  Using the Toilet? N -  In the past six months, have you accidently leaked urine? N -  Do you have problems with loss of bowel control? N -  Managing your Medications? N -  Managing your Finances? N -  Housekeeping or managing your Housekeeping? N -  Some recent data might  be hidden    Patient Care Team: Venita Lick, NP as PCP - General (Nurse Practitioner) True, Isabella Bowens, MD as Consulting Physician (Nephrology) Gabriel Carina Betsey Holiday, MD as Consulting Physician (Endocrinology) Ubaldo Glassing Javier Docker, MD as Consulting Physician (Cardiology)  Indicate any recent Medical Services you may have received from other than [facility] providers in the past year (date may be approximate).     Assessment:   This is a routine wellness examination for [place].  Hearing/Vision screen  Hearing Screening   125Hz  250Hz  500Hz  1000Hz  2000Hz  3000Hz  4000Hz  6000Hz  8000Hz   Right ear:           Left ear:           Vision Screening Comments: Regular eye exams, [facility]  Dietary issues and exercise activities discussed: Current Exercise Habits: Home exercise routine, Type of exercise: treadmill, Time (Minutes): 30, Frequency (Times/Week): 4, Weekly Exercise (Minutes/Week): 120  Goals    . Patient Stated     07/21/2020, to continue losing weight want to get to 220 pounds by the end of the year      Depression Screen PHQ 2/9 Scores 07/21/2020 04/27/2020  PHQ - 2 Score 0 0    Fall Risk Fall Risk  07/21/2020 04/27/2020  Falls in the past year? 0 -  Number falls in past yr: - 0  Injury with Fall? - 0  Risk for fall due to : Medication side effect -  Follow up Falls evaluation completed;Education provided;Falls prevention discussed Falls evaluation completed    Any stairs in or around the home? Yes  If so, are there any without handrails? No  Home free of loose throw rugs in walkways, pet beds, electrical cords, etc? Yes  Adequate lighting in your home to reduce risk of falls? Yes   ASSISTIVE DEVICES UTILIZED TO PREVENT FALLS:  Life alert? No  Use of a cane, walker or w/c? No  Grab bars in the bathroom? No  Shower chair or bench in shower? No  Elevated toilet seat or a handicapped toilet? No   TIMED UP AND GO:  Was the test performed? No .  Cognitive Function:     6CIT Screen  07/21/2020  What Year? 0 points  What month? 0 points  What time? 0 points  Count back from 20 0 points  Months in reverse 0 points  Repeat phrase 0 points  Total Score 0    Immunizations Immunization History  Administered Date(s) Administered  . Influenza,inj,Quad PF,6+ Mos 09/28/2013, 09/14/2014, 10/10/2015, 11/12/2016  . PFIZER SARS-COV-2 Vaccination 03/11/2020, 04/04/2020  . Pneumococcal Conjugate-13 05/11/2014  . Pneumococcal Polysaccharide-23 11/13/2010    TDAP status: Due, Education has been provided regarding the importance of this vaccine. Advised may receive this vaccine at local pharmacy or Health Dept. Aware to provide a copy of the vaccination record if obtained from local pharmacy or Health Dept. Verbalized acceptance and understanding. Flu Vaccine status: Up to date per patient Pneumococcal vaccine status: Up to date Covid-19 vaccine status: Completed vaccines  Qualifies for Shingles Vaccine? No   Zostavax completed n/a  Shingrix Completed?: n/a  Screening Tests Health Maintenance  Topic Date Due  . Hepatitis C Screening  Never done  . FOOT EXAM  Never done  . OPHTHALMOLOGY EXAM  Never done  . HIV Screening  Never done  . TETANUS/TDAP  Never done  . HEMOGLOBIN A1C  06/21/2020  . INFLUENZA VACCINE  07/30/2020  . PNEUMOCOCCAL POLYSACCHARIDE VACCINE AGE 15-64 HIGH RISK  Completed  . COVID-19 Vaccine  Completed    Health Maintenance  Health Maintenance Due  Topic Date Due  . Hepatitis C Screening  Never done  . FOOT EXAM  Never done  . OPHTHALMOLOGY EXAM  Never done  . HIV Screening  Never done  . TETANUS/TDAP  Never done  . HEMOGLOBIN A1C  06/21/2020    Colorectal cancer screening: n/a  Lung Cancer Screening: (Low Dose CT Chest recommended if Age 70-80 years, 30 pack-year currently smoking OR have quit w/in 15years.) does not qualify.   Lung Cancer Screening Referral: no  Additional Screening:  Hepatitis C Screening: does qualify;  Vision  Screening: Recommended annual ophthalmology exams for early detection of glaucoma and other disorders of the eye. Is the patient up to date with their annual eye exam?  Yes  Who is the provider or what is the name of the office in which the patient attends annual eye exams? [facility] If pt is not established with a provider, would they like to be referred to a provider to establish care? No .   Dental Screening: Recommended annual dental exams for proper oral hygiene  Community Resource Referral / Chronic Care Management: CRR required this visit?  No   CCM required this visit?  No      Plan:     I have personally reviewed and noted the following in the patient's chart:   . Medical and social history . Use of alcohol, tobacco or illicit drugs  . Current medications and supplements . Functional ability and status . Nutritional status . Physical activity . Advanced directives . List of other physicians . Hospitalizations, surgeries, and ER visits in previous 12 months . Vitals . Screenings to include cognitive, depression, and falls . Referrals and appointments  In addition, I have reviewed and discussed with patient certain preventive protocols, quality metrics, and best practice recommendations. A written personalized care plan for preventive services as well as general preventive health recommendations were provided to patient.   Due to this being a telephonic visit, the after visit summary with patients personalized plan was offered to patient via mail or my-chart. Patient preferred to pick up at office at next visit.  Kellie Simmering, LPN   [id number]   Nurse Notes:

## 2020-07-21 NOTE — Patient Instructions (Signed)
Mr. Eric Frey , Thank you for taking time to come for your Medicare Wellness Visit. I appreciate your ongoing commitment to your health goals. Please review the following plan we discussed and let me know if I can assist you in the future.   Screening recommendations/referrals: Colonoscopy: n/a Recommended yearly ophthalmology/optometry visit for glaucoma screening and checkup Recommended yearly dental visit for hygiene and checkup  Vaccinations: Influenza vaccine: completed 09/2019 per patient Pneumococcal vaccine: completed 05/11/2014 Tdap vaccine: due Shingles vaccine: n/a   Covid-19: 04/04/2020, 03/11/2020  Advanced directives: Advance directive discussed with you today.   Conditions/risks identified: none  Next appointment: Follow up in one year for your annual wellness visit   Preventive Care 40-64 Years, Male Preventive care refers to lifestyle choices and visits with your health care provider that can promote health and wellness. What does preventive care include?  A yearly physical exam. This is also called an annual well check.  Dental exams once or twice a year.  Routine eye exams. Ask your health care provider how often you should have your eyes checked.  Personal lifestyle choices, including:  Daily care of your teeth and gums.  Regular physical activity.  Eating a healthy diet.  Avoiding tobacco and drug use.  Limiting alcohol use.  Practicing safe sex.  Taking low-dose aspirin every day starting at age 87. What happens during an annual well check? The services and screenings done by your health care provider during your annual well check will depend on your age, overall health, lifestyle risk factors, and family history of disease. Counseling  Your health care provider may ask you questions about your:  Alcohol use.  Tobacco use.  Drug use.  Emotional well-being.  Home and relationship well-being.  Sexual activity.  Eating habits.  Work  and work Statistician. Screening  You may have the following tests or measurements:  Height, weight, and BMI.  Blood pressure.  Lipid and cholesterol levels. These may be checked every 5 years, or more frequently if you are over 15 years old.  Skin check.  Lung cancer screening. You may have this screening every year starting at age 58 if you have a 30-pack-year history of smoking and currently smoke or have quit within the past 15 years.  Fecal occult blood test (FOBT) of the stool. You may have this test every year starting at age 52.  Flexible sigmoidoscopy or colonoscopy. You may have a sigmoidoscopy every 5 years or a colonoscopy every 10 years starting at age 42.  Prostate cancer screening. Recommendations will vary depending on your family history and other risks.  Hepatitis C blood test.  Hepatitis B blood test.  Sexually transmitted disease (STD) testing.  Diabetes screening. This is done by checking your blood sugar (glucose) after you have not eaten for a while (fasting). You may have this done every 1-3 years. Discuss your test results, treatment options, and if necessary, the need for more tests with your health care provider. Vaccines  Your health care provider may recommend certain vaccines, such as:  Influenza vaccine. This is recommended every year.  Tetanus, diphtheria, and acellular pertussis (Tdap, Td) vaccine. You may need a Td booster every 10 years.  Zoster vaccine. You may need this after age 59.  Pneumococcal 13-valent conjugate (PCV13) vaccine. You may need this if you have certain conditions and have not been vaccinated.  Pneumococcal polysaccharide (PPSV23) vaccine. You may need one or two doses if you smoke cigarettes or if you have certain conditions. Talk  to your health care provider about which screenings and vaccines you need and how often you need them. This information is not intended to replace advice given to you by your health care  provider. Make sure you discuss any questions you have with your health care provider. Document Released: 01/12/2016 Document Revised: 09/04/2016 Document Reviewed: 10/17/2015 Elsevier Interactive Patient Education  2017 [place] Prevention in the Home Falls can cause injuries. They can happen to people of all ages. There are many things you can do to make your home safe and to help prevent falls. What can I do on the outside of my home?  Regularly fix the edges of walkways and driveways and fix any cracks.  Remove anything that might make you trip as you walk through a door, such as a raised step or threshold.  Trim any bushes or trees on the path to your home.  Use bright outdoor lighting.  Clear any walking paths of anything that might make someone trip, such as rocks or tools.  Regularly check to see if handrails are loose or broken. Make sure that both sides of any steps have handrails.  Any raised decks and porches should have guardrails on the edges.  Have any leaves, snow, or ice cleared regularly.  Use sand or salt on walking paths during winter.  Clean up any spills in your garage right away. This includes oil or grease spills. What can I do in the bathroom?  Use night lights.  Install grab bars by the toilet and in the tub and shower. Do not use towel bars as grab bars.  Use non-skid mats or decals in the tub or shower.  If you need to sit down in the shower, use a plastic, non-slip stool.  Keep the floor dry. Clean up any water that spills on the floor as soon as it happens.  Remove soap buildup in the tub or shower regularly.  Attach bath mats securely with double-sided non-slip rug tape.  Do not have throw rugs and other things on the floor that can make you trip. What can I do in the bedroom?  Use night lights.  Make sure that you have a light by your bed that is easy to reach.  Do not use any sheets or blankets that are too big for your  bed. They should not hang down onto the floor.  Have a firm chair that has side arms. You can use this for support while you get dressed.  Do not have throw rugs and other things on the floor that can make you trip. What can I do in the kitchen?  Clean up any spills right away.  Avoid walking on wet floors.  Keep items that you use a lot in easy-to-reach places.  If you need to reach something above you, use a strong step stool that has a grab bar.  Keep electrical cords out of the way.  Do not use floor polish or wax that makes floors slippery. If you must use wax, use non-skid floor wax.  Do not have throw rugs and other things on the floor that can make you trip. What can I do with my stairs?  Do not leave any items on the stairs.  Make sure that there are handrails on both sides of the stairs and use them. Fix handrails that are broken or loose. Make sure that handrails are as long as the stairways.  Check any carpeting to make sure that  it is firmly attached to the stairs. Fix any carpet that is loose or worn.  Avoid having throw rugs at the top or bottom of the stairs. If you do have throw rugs, attach them to the floor with carpet tape.  Make sure that you have a light switch at the top of the stairs and the bottom of the stairs. If you do not have them, ask someone to add them for you. What else can I do to help prevent falls?  Wear shoes that:  Do not have high heels.  Have rubber bottoms.  Are comfortable and fit you well.  Are closed at the toe. Do not wear sandals.  If you use a stepladder:  Make sure that it is fully opened. Do not climb a closed stepladder.  Make sure that both sides of the stepladder are locked into place.  Ask someone to hold it for you, if possible.  Clearly mark and make sure that you can see:  Any grab bars or handrails.  First and last steps.  Where the edge of each step is.  Use tools that help you move around (mobility  aids) if they are needed. These include:  Canes.  Walkers.  Scooters.  Crutches.  Turn on the lights when you go into a dark area. Replace any light bulbs as soon as they burn out.  Set up your furniture so you have a clear path. Avoid moving your furniture around.  If any of your floors are uneven, fix them.  If there are any pets around you, be aware of where they are.  Review your medicines with your doctor. Some medicines can make you feel dizzy. This can increase your chance of falling. Ask your doctor what other things that you can do to help prevent falls. This information is not intended to replace advice given to you by your health care provider. Make sure you discuss any questions you have with your health care provider. Document Released: 10/12/2009 Document Revised: 05/23/2016 Document Reviewed: 01/20/2015 Elsevier Interactive Patient Education  2017 [employer].

## 2020-08-05 MED ORDER — CITALOPRAM 20 MG TABLET
ORAL_TABLET | 11 refills | 0 days
Start: 2020-08-05 — End: ?

## 2020-08-06 DIAGNOSIS — F419 Anxiety disorder, unspecified: Principal | ICD-10-CM

## 2020-08-06 MED ORDER — SODIUM BICARBONATE 650 MG TABLET
ORAL_TABLET | Freq: Two times a day (BID) | ORAL | 11 refills | 30 days
Start: 2020-08-06 — End: 2021-08-06

## 2020-08-06 MED ORDER — CITALOPRAM 10 MG TABLET
ORAL_TABLET | Freq: Every day | ORAL | 3 refills | 90 days
Start: 2020-08-06 — End: 2021-08-06

## 2020-08-06 MED ORDER — CARVEDILOL 25 MG TABLET
ORAL_TABLET | Freq: Two times a day (BID) | ORAL | 3 refills | 90 days
Start: 2020-08-06 — End: 2021-08-06

## 2020-08-07 MED ORDER — SODIUM BICARBONATE 650 MG TABLET
ORAL_TABLET | Freq: Two times a day (BID) | ORAL | 11 refills | 30.00000 days | Status: CP
Start: 2020-08-07 — End: 2021-08-07

## 2020-08-07 MED ORDER — CITALOPRAM 20 MG TABLET
ORAL_TABLET | 11 refills | 0 days
Start: 2020-08-07 — End: ?

## 2020-08-07 MED ORDER — CLONIDINE HCL 0.3 MG TABLET
ORAL_TABLET | Freq: Two times a day (BID) | ORAL | 11 refills | 30 days | Status: CP
Start: 2020-08-07 — End: 2021-08-07

## 2020-08-07 MED ORDER — CARVEDILOL 25 MG TABLET
ORAL_TABLET | Freq: Two times a day (BID) | ORAL | 3 refills | 90.00000 days | Status: CP
Start: 2020-08-07 — End: 2021-08-07

## 2020-08-13 MED ORDER — CITALOPRAM 20 MG TABLET
ORAL_TABLET | 11 refills | 0 days | Status: CP
Start: 2020-08-13 — End: ?

## 2020-08-25 ENCOUNTER — Ambulatory Visit: Admit: 2020-08-25 | Discharge: 2020-08-26 | Payer: MEDICARE

## 2020-09-01 ENCOUNTER — Ambulatory Visit: Admit: 2020-09-01 | Discharge: 2020-09-01 | Payer: MEDICARE | Attending: Nephrology | Primary: Nephrology

## 2020-09-01 ENCOUNTER — Ambulatory Visit: Admit: 2020-09-01 | Discharge: 2020-09-01 | Payer: MEDICARE

## 2020-09-01 DIAGNOSIS — Z940 Kidney transplant status: Principal | ICD-10-CM

## 2020-09-01 DIAGNOSIS — N289 Disorder of kidney and ureter, unspecified: Principal | ICD-10-CM

## 2020-09-02 MED ORDER — TRAZODONE 50 MG TABLET
ORAL_TABLET | 11 refills | 0 days | Status: CP
Start: 2020-09-02 — End: ?

## 2020-09-05 ENCOUNTER — Ambulatory Visit: Admit: 2020-09-05 | Discharge: 2020-09-06 | Payer: MEDICARE | Attending: Family | Primary: Family

## 2020-09-05 DIAGNOSIS — Z903 Acquired absence of stomach [part of]: Principal | ICD-10-CM

## 2020-09-05 DIAGNOSIS — Z6835 Body mass index (BMI) 35.0-35.9, adult: Principal | ICD-10-CM

## 2020-09-12 MED ORDER — TRAZODONE 50 MG TABLET
ORAL_TABLET | 11 refills | 0 days
Start: 2020-09-12 — End: ?

## 2020-09-18 ENCOUNTER — Institutional Professional Consult (permissible substitution): Admit: 2020-09-18 | Discharge: 2020-09-19 | Payer: MEDICARE | Attending: Clinical | Primary: Clinical

## 2020-10-16 ENCOUNTER — Ambulatory Visit: Admit: 2020-10-16 | Discharge: 2020-11-01 | Disposition: A | Discharge status: Home / Self Care | Payer: MEDICARE

## 2020-10-30 DIAGNOSIS — F32A Depression, unspecified: Principal | ICD-10-CM

## 2020-10-30 DIAGNOSIS — E440 Moderate protein-calorie malnutrition: Principal | ICD-10-CM

## 2020-10-30 DIAGNOSIS — B027 Disseminated zoster: Principal | ICD-10-CM

## 2020-10-30 DIAGNOSIS — G4733 Obstructive sleep apnea (adult) (pediatric): Principal | ICD-10-CM

## 2020-10-30 DIAGNOSIS — I120 Hypertensive chronic kidney disease with stage 5 chronic kidney disease or end stage renal disease: Principal | ICD-10-CM

## 2020-10-30 DIAGNOSIS — Z7982 Long term (current) use of aspirin: Principal | ICD-10-CM

## 2020-10-30 DIAGNOSIS — T375X5A Adverse effect of antiviral drugs, initial encounter: Principal | ICD-10-CM

## 2020-10-30 DIAGNOSIS — Z6833 Body mass index (BMI) 33.0-33.9, adult: Principal | ICD-10-CM

## 2020-10-30 DIAGNOSIS — H409 Unspecified glaucoma: Principal | ICD-10-CM

## 2020-10-30 DIAGNOSIS — H269 Unspecified cataract: Principal | ICD-10-CM

## 2020-10-30 DIAGNOSIS — E1165 Type 2 diabetes mellitus with hyperglycemia: Principal | ICD-10-CM

## 2020-10-30 DIAGNOSIS — I313 Pericardial effusion (noninflammatory): Principal | ICD-10-CM

## 2020-10-30 DIAGNOSIS — R1030 Lower abdominal pain, unspecified: Principal | ICD-10-CM

## 2020-10-30 DIAGNOSIS — Z87891 Personal history of nicotine dependence: Principal | ICD-10-CM

## 2020-10-30 DIAGNOSIS — G929 Unspecified toxic encephalopathy: Principal | ICD-10-CM

## 2020-10-30 DIAGNOSIS — Z9884 Bariatric surgery status: Principal | ICD-10-CM

## 2020-10-30 DIAGNOSIS — T8612 Kidney transplant failure: Principal | ICD-10-CM

## 2020-10-30 DIAGNOSIS — E872 Acidosis: Principal | ICD-10-CM

## 2020-10-30 DIAGNOSIS — B353 Tinea pedis: Principal | ICD-10-CM

## 2020-10-30 DIAGNOSIS — E785 Hyperlipidemia, unspecified: Principal | ICD-10-CM

## 2020-10-30 DIAGNOSIS — E1122 Type 2 diabetes mellitus with diabetic chronic kidney disease: Principal | ICD-10-CM

## 2020-10-30 DIAGNOSIS — N179 Acute kidney failure, unspecified: Principal | ICD-10-CM

## 2020-10-30 DIAGNOSIS — Z6838 Body mass index (BMI) 38.0-38.9, adult: Principal | ICD-10-CM

## 2020-10-30 DIAGNOSIS — D849 Immunodeficiency, unspecified: Principal | ICD-10-CM

## 2020-10-30 DIAGNOSIS — Z20822 Contact with and (suspected) exposure to covid-19: Principal | ICD-10-CM

## 2020-10-30 DIAGNOSIS — Z8673 Personal history of transient ischemic attack (TIA), and cerebral infarction without residual deficits: Principal | ICD-10-CM

## 2020-10-30 DIAGNOSIS — Z794 Long term (current) use of insulin: Principal | ICD-10-CM

## 2020-10-30 DIAGNOSIS — E875 Hyperkalemia: Principal | ICD-10-CM

## 2020-11-01 MED ORDER — VALACYCLOVIR 500 MG TABLET: tablet | 1 refills | 0 days | Status: AC

## 2020-11-01 MED ORDER — CLONIDINE HCL 0.3 MG TABLET
ORAL_TABLET | Freq: Three times a day (TID) | ORAL | 0 refills | 30.00000 days | Status: CP
Start: 2020-11-01 — End: 2020-12-01

## 2020-11-01 MED ORDER — SODIUM BICARBONATE 650 MG TABLET
ORAL_TABLET | Freq: Three times a day (TID) | ORAL | 0 refills | 30.00000 days | Status: CP
Start: 2020-11-01 — End: 2021-12-13

## 2020-11-01 MED ORDER — GABAPENTIN 100 MG CAPSULE: 200 mg | capsule | Freq: Three times a day (TID) | 0 refills | 30 days | Status: AC

## 2020-11-01 MED ORDER — BUMETANIDE 1 MG TABLET: 1 mg | tablet | Freq: Every day | 0 refills | 30 days | Status: AC

## 2020-11-01 MED ORDER — TACROLIMUS 1 MG CAPSULE, IMMEDIATE-RELEASE
ORAL_CAPSULE | Freq: Two times a day (BID) | ORAL | 11 refills | 30.00000 days | Status: CP
Start: 2020-11-01 — End: 2021-01-05

## 2020-11-01 MED ORDER — BUMETANIDE 1 MG TABLET
ORAL_TABLET | 0 refills | 0 days
Start: 2020-11-01 — End: ?

## 2020-11-01 MED ORDER — VALACYCLOVIR 500 MG TABLET
ORAL_TABLET | ORAL | 1 refills | 0.00000 days | Status: CP
Start: 2020-11-01 — End: 2020-11-01

## 2020-11-01 MED ORDER — GABAPENTIN 100 MG CAPSULE
ORAL_CAPSULE | Freq: Three times a day (TID) | ORAL | 0 refills | 30.00000 days | Status: CP
Start: 2020-11-01 — End: 2021-12-13

## 2020-11-02 MED ORDER — VALACYCLOVIR 1 GRAM TABLET
ORAL_TABLET | Freq: Every day | ORAL | 0 refills | 4 days | Status: CN
Start: 2020-11-02 — End: 2020-11-06

## 2020-11-02 MED ORDER — BUMETANIDE 1 MG TABLET
ORAL_TABLET | Freq: Every day | ORAL | 0 refills | 30.00000 days | Status: CP
Start: 2020-11-02 — End: 2020-11-01

## 2020-11-06 MED ORDER — CLONIDINE HCL 0.3 MG TABLET
ORAL_TABLET | 0 refills | 0.00000 days
Start: 2020-11-06 — End: ?

## 2020-11-10 ENCOUNTER — Encounter: Admit: 2020-11-10 | Discharge: 2020-11-19 | Disposition: A | Discharge status: Home Health | Payer: MEDICARE

## 2020-11-10 ENCOUNTER — Ambulatory Visit: Admit: 2020-11-10 | Discharge: 2020-11-19 | Disposition: A | Discharge status: Home Health | Payer: MEDICARE

## 2020-11-10 DIAGNOSIS — I509 Heart failure, unspecified: Principal | ICD-10-CM

## 2020-11-10 DIAGNOSIS — A419 Sepsis, unspecified organism: Principal | ICD-10-CM

## 2020-11-10 DIAGNOSIS — I129 Hypertensive chronic kidney disease with stage 1 through stage 4 chronic kidney disease, or unspecified chronic kidney disease: Principal | ICD-10-CM

## 2020-11-10 DIAGNOSIS — Z9884 Bariatric surgery status: Principal | ICD-10-CM

## 2020-11-10 DIAGNOSIS — Z7982 Long term (current) use of aspirin: Principal | ICD-10-CM

## 2020-11-10 DIAGNOSIS — E785 Hyperlipidemia, unspecified: Principal | ICD-10-CM

## 2020-11-10 DIAGNOSIS — A403 Sepsis due to Streptococcus pneumoniae: Principal | ICD-10-CM

## 2020-11-10 DIAGNOSIS — B953 Streptococcus pneumoniae as the cause of diseases classified elsewhere: Principal | ICD-10-CM

## 2020-11-10 DIAGNOSIS — N184 Chronic kidney disease, stage 4 (severe): Principal | ICD-10-CM

## 2020-11-10 DIAGNOSIS — E8770 Fluid overload, unspecified: Principal | ICD-10-CM

## 2020-11-10 DIAGNOSIS — H409 Unspecified glaucoma: Principal | ICD-10-CM

## 2020-11-10 DIAGNOSIS — F32A Depression, unspecified: Principal | ICD-10-CM

## 2020-11-10 DIAGNOSIS — H269 Unspecified cataract: Principal | ICD-10-CM

## 2020-11-10 DIAGNOSIS — Z20822 Contact with and (suspected) exposure to covid-19: Principal | ICD-10-CM

## 2020-11-10 DIAGNOSIS — B027 Disseminated zoster: Principal | ICD-10-CM

## 2020-11-10 DIAGNOSIS — F1021 Alcohol dependence, in remission: Principal | ICD-10-CM

## 2020-11-10 DIAGNOSIS — L03311 Cellulitis of abdominal wall: Principal | ICD-10-CM

## 2020-11-10 DIAGNOSIS — H938X9 Other specified disorders of ear, unspecified ear: Principal | ICD-10-CM

## 2020-11-10 DIAGNOSIS — G4733 Obstructive sleep apnea (adult) (pediatric): Principal | ICD-10-CM

## 2020-11-10 DIAGNOSIS — D649 Anemia, unspecified: Principal | ICD-10-CM

## 2020-11-10 DIAGNOSIS — Z794 Long term (current) use of insulin: Principal | ICD-10-CM

## 2020-11-10 DIAGNOSIS — Z79899 Other long term (current) drug therapy: Principal | ICD-10-CM

## 2020-11-10 DIAGNOSIS — J13 Pneumonia due to Streptococcus pneumoniae: Principal | ICD-10-CM

## 2020-11-10 DIAGNOSIS — I313 Pericardial effusion (noninflammatory): Principal | ICD-10-CM

## 2020-11-10 DIAGNOSIS — R0602 Shortness of breath: Principal | ICD-10-CM

## 2020-11-10 DIAGNOSIS — Z940 Kidney transplant status: Principal | ICD-10-CM

## 2020-11-10 DIAGNOSIS — E875 Hyperkalemia: Principal | ICD-10-CM

## 2020-11-10 DIAGNOSIS — Z8673 Personal history of transient ischemic attack (TIA), and cerebral infarction without residual deficits: Principal | ICD-10-CM

## 2020-11-10 DIAGNOSIS — Z6841 Body Mass Index (BMI) 40.0 and over, adult: Principal | ICD-10-CM

## 2020-11-10 DIAGNOSIS — D849 Immunodeficiency, unspecified: Principal | ICD-10-CM

## 2020-11-10 DIAGNOSIS — Z87891 Personal history of nicotine dependence: Principal | ICD-10-CM

## 2020-11-10 DIAGNOSIS — R0902 Hypoxemia: Principal | ICD-10-CM

## 2020-11-10 DIAGNOSIS — E1122 Type 2 diabetes mellitus with diabetic chronic kidney disease: Principal | ICD-10-CM

## 2020-11-10 DIAGNOSIS — F1410 Cocaine abuse, uncomplicated: Principal | ICD-10-CM

## 2020-11-10 DIAGNOSIS — R7881 Bacteremia: Principal | ICD-10-CM

## 2020-11-10 DIAGNOSIS — N189 Chronic kidney disease, unspecified: Principal | ICD-10-CM

## 2020-11-11 DIAGNOSIS — G4733 Obstructive sleep apnea (adult) (pediatric): Principal | ICD-10-CM

## 2020-11-11 DIAGNOSIS — I509 Heart failure, unspecified: Principal | ICD-10-CM

## 2020-11-11 DIAGNOSIS — J13 Pneumonia due to Streptococcus pneumoniae: Principal | ICD-10-CM

## 2020-11-11 DIAGNOSIS — E8770 Fluid overload, unspecified: Principal | ICD-10-CM

## 2020-11-11 DIAGNOSIS — Z87891 Personal history of nicotine dependence: Principal | ICD-10-CM

## 2020-11-11 DIAGNOSIS — H269 Unspecified cataract: Principal | ICD-10-CM

## 2020-11-11 DIAGNOSIS — L03311 Cellulitis of abdominal wall: Principal | ICD-10-CM

## 2020-11-11 DIAGNOSIS — A403 Sepsis due to Streptococcus pneumoniae: Principal | ICD-10-CM

## 2020-11-11 DIAGNOSIS — Z7982 Long term (current) use of aspirin: Principal | ICD-10-CM

## 2020-11-11 DIAGNOSIS — N184 Chronic kidney disease, stage 4 (severe): Principal | ICD-10-CM

## 2020-11-11 DIAGNOSIS — Z9884 Bariatric surgery status: Principal | ICD-10-CM

## 2020-11-11 DIAGNOSIS — H409 Unspecified glaucoma: Principal | ICD-10-CM

## 2020-11-11 DIAGNOSIS — E785 Hyperlipidemia, unspecified: Principal | ICD-10-CM

## 2020-11-11 DIAGNOSIS — B027 Disseminated zoster: Principal | ICD-10-CM

## 2020-11-11 DIAGNOSIS — E1122 Type 2 diabetes mellitus with diabetic chronic kidney disease: Principal | ICD-10-CM

## 2020-11-11 DIAGNOSIS — Z940 Kidney transplant status: Principal | ICD-10-CM

## 2020-11-11 DIAGNOSIS — Z20822 Contact with and (suspected) exposure to covid-19: Principal | ICD-10-CM

## 2020-11-11 DIAGNOSIS — E875 Hyperkalemia: Principal | ICD-10-CM

## 2020-11-11 DIAGNOSIS — D849 Immunodeficiency, unspecified: Principal | ICD-10-CM

## 2020-11-11 DIAGNOSIS — F1410 Cocaine abuse, uncomplicated: Principal | ICD-10-CM

## 2020-11-11 DIAGNOSIS — F1021 Alcohol dependence, in remission: Principal | ICD-10-CM

## 2020-11-11 DIAGNOSIS — H938X9 Other specified disorders of ear, unspecified ear: Principal | ICD-10-CM

## 2020-11-11 DIAGNOSIS — D649 Anemia, unspecified: Principal | ICD-10-CM

## 2020-11-11 DIAGNOSIS — F32A Depression, unspecified: Principal | ICD-10-CM

## 2020-11-11 DIAGNOSIS — I313 Pericardial effusion (noninflammatory): Principal | ICD-10-CM

## 2020-11-11 DIAGNOSIS — Z8673 Personal history of transient ischemic attack (TIA), and cerebral infarction without residual deficits: Principal | ICD-10-CM

## 2020-11-11 DIAGNOSIS — Z794 Long term (current) use of insulin: Principal | ICD-10-CM

## 2020-11-11 DIAGNOSIS — I129 Hypertensive chronic kidney disease with stage 1 through stage 4 chronic kidney disease, or unspecified chronic kidney disease: Principal | ICD-10-CM

## 2020-11-11 DIAGNOSIS — Z6841 Body Mass Index (BMI) 40.0 and over, adult: Principal | ICD-10-CM

## 2020-11-11 DIAGNOSIS — Z79899 Other long term (current) drug therapy: Principal | ICD-10-CM

## 2020-11-12 DIAGNOSIS — H409 Unspecified glaucoma: Principal | ICD-10-CM

## 2020-11-12 DIAGNOSIS — B027 Disseminated zoster: Principal | ICD-10-CM

## 2020-11-12 DIAGNOSIS — I129 Hypertensive chronic kidney disease with stage 1 through stage 4 chronic kidney disease, or unspecified chronic kidney disease: Principal | ICD-10-CM

## 2020-11-12 DIAGNOSIS — Z794 Long term (current) use of insulin: Principal | ICD-10-CM

## 2020-11-12 DIAGNOSIS — I313 Pericardial effusion (noninflammatory): Principal | ICD-10-CM

## 2020-11-12 DIAGNOSIS — E785 Hyperlipidemia, unspecified: Principal | ICD-10-CM

## 2020-11-12 DIAGNOSIS — H938X9 Other specified disorders of ear, unspecified ear: Principal | ICD-10-CM

## 2020-11-12 DIAGNOSIS — Z8673 Personal history of transient ischemic attack (TIA), and cerebral infarction without residual deficits: Principal | ICD-10-CM

## 2020-11-12 DIAGNOSIS — F1021 Alcohol dependence, in remission: Principal | ICD-10-CM

## 2020-11-12 DIAGNOSIS — F1410 Cocaine abuse, uncomplicated: Principal | ICD-10-CM

## 2020-11-12 DIAGNOSIS — E1122 Type 2 diabetes mellitus with diabetic chronic kidney disease: Principal | ICD-10-CM

## 2020-11-12 DIAGNOSIS — D649 Anemia, unspecified: Principal | ICD-10-CM

## 2020-11-12 DIAGNOSIS — Z7982 Long term (current) use of aspirin: Principal | ICD-10-CM

## 2020-11-12 DIAGNOSIS — A403 Sepsis due to Streptococcus pneumoniae: Principal | ICD-10-CM

## 2020-11-12 DIAGNOSIS — Z940 Kidney transplant status: Principal | ICD-10-CM

## 2020-11-12 DIAGNOSIS — I509 Heart failure, unspecified: Principal | ICD-10-CM

## 2020-11-12 DIAGNOSIS — Z6841 Body Mass Index (BMI) 40.0 and over, adult: Principal | ICD-10-CM

## 2020-11-12 DIAGNOSIS — F32A Depression, unspecified: Principal | ICD-10-CM

## 2020-11-12 DIAGNOSIS — D849 Immunodeficiency, unspecified: Principal | ICD-10-CM

## 2020-11-12 DIAGNOSIS — E8770 Fluid overload, unspecified: Principal | ICD-10-CM

## 2020-11-12 DIAGNOSIS — Z79899 Other long term (current) drug therapy: Principal | ICD-10-CM

## 2020-11-12 DIAGNOSIS — N184 Chronic kidney disease, stage 4 (severe): Principal | ICD-10-CM

## 2020-11-12 DIAGNOSIS — J13 Pneumonia due to Streptococcus pneumoniae: Principal | ICD-10-CM

## 2020-11-12 DIAGNOSIS — Z20822 Contact with and (suspected) exposure to covid-19: Principal | ICD-10-CM

## 2020-11-12 DIAGNOSIS — G4733 Obstructive sleep apnea (adult) (pediatric): Principal | ICD-10-CM

## 2020-11-12 DIAGNOSIS — L03311 Cellulitis of abdominal wall: Principal | ICD-10-CM

## 2020-11-12 DIAGNOSIS — Z9884 Bariatric surgery status: Principal | ICD-10-CM

## 2020-11-12 DIAGNOSIS — H269 Unspecified cataract: Principal | ICD-10-CM

## 2020-11-12 DIAGNOSIS — Z87891 Personal history of nicotine dependence: Principal | ICD-10-CM

## 2020-11-12 DIAGNOSIS — E875 Hyperkalemia: Principal | ICD-10-CM

## 2020-11-13 DIAGNOSIS — Z6841 Body Mass Index (BMI) 40.0 and over, adult: Principal | ICD-10-CM

## 2020-11-13 DIAGNOSIS — H409 Unspecified glaucoma: Principal | ICD-10-CM

## 2020-11-13 DIAGNOSIS — D649 Anemia, unspecified: Principal | ICD-10-CM

## 2020-11-13 DIAGNOSIS — Z79899 Other long term (current) drug therapy: Principal | ICD-10-CM

## 2020-11-13 DIAGNOSIS — I129 Hypertensive chronic kidney disease with stage 1 through stage 4 chronic kidney disease, or unspecified chronic kidney disease: Principal | ICD-10-CM

## 2020-11-13 DIAGNOSIS — E875 Hyperkalemia: Principal | ICD-10-CM

## 2020-11-13 DIAGNOSIS — Z87891 Personal history of nicotine dependence: Principal | ICD-10-CM

## 2020-11-13 DIAGNOSIS — N184 Chronic kidney disease, stage 4 (severe): Principal | ICD-10-CM

## 2020-11-13 DIAGNOSIS — F32A Depression, unspecified: Principal | ICD-10-CM

## 2020-11-13 DIAGNOSIS — Z8673 Personal history of transient ischemic attack (TIA), and cerebral infarction without residual deficits: Principal | ICD-10-CM

## 2020-11-13 DIAGNOSIS — D849 Immunodeficiency, unspecified: Principal | ICD-10-CM

## 2020-11-13 DIAGNOSIS — Z20822 Contact with and (suspected) exposure to covid-19: Principal | ICD-10-CM

## 2020-11-13 DIAGNOSIS — A403 Sepsis due to Streptococcus pneumoniae: Principal | ICD-10-CM

## 2020-11-13 DIAGNOSIS — Z9884 Bariatric surgery status: Principal | ICD-10-CM

## 2020-11-13 DIAGNOSIS — F1410 Cocaine abuse, uncomplicated: Principal | ICD-10-CM

## 2020-11-13 DIAGNOSIS — Z940 Kidney transplant status: Principal | ICD-10-CM

## 2020-11-13 DIAGNOSIS — Z794 Long term (current) use of insulin: Principal | ICD-10-CM

## 2020-11-13 DIAGNOSIS — H269 Unspecified cataract: Principal | ICD-10-CM

## 2020-11-13 DIAGNOSIS — J13 Pneumonia due to Streptococcus pneumoniae: Principal | ICD-10-CM

## 2020-11-13 DIAGNOSIS — H938X9 Other specified disorders of ear, unspecified ear: Principal | ICD-10-CM

## 2020-11-13 DIAGNOSIS — B027 Disseminated zoster: Principal | ICD-10-CM

## 2020-11-13 DIAGNOSIS — I313 Pericardial effusion (noninflammatory): Principal | ICD-10-CM

## 2020-11-13 DIAGNOSIS — E8770 Fluid overload, unspecified: Principal | ICD-10-CM

## 2020-11-13 DIAGNOSIS — F1021 Alcohol dependence, in remission: Principal | ICD-10-CM

## 2020-11-13 DIAGNOSIS — G4733 Obstructive sleep apnea (adult) (pediatric): Principal | ICD-10-CM

## 2020-11-13 DIAGNOSIS — L03311 Cellulitis of abdominal wall: Principal | ICD-10-CM

## 2020-11-13 DIAGNOSIS — E1122 Type 2 diabetes mellitus with diabetic chronic kidney disease: Principal | ICD-10-CM

## 2020-11-13 DIAGNOSIS — I509 Heart failure, unspecified: Principal | ICD-10-CM

## 2020-11-13 DIAGNOSIS — E785 Hyperlipidemia, unspecified: Principal | ICD-10-CM

## 2020-11-13 DIAGNOSIS — Z7982 Long term (current) use of aspirin: Principal | ICD-10-CM

## 2020-11-14 DIAGNOSIS — I313 Pericardial effusion (noninflammatory): Principal | ICD-10-CM

## 2020-11-14 DIAGNOSIS — H269 Unspecified cataract: Principal | ICD-10-CM

## 2020-11-14 DIAGNOSIS — Z79899 Other long term (current) drug therapy: Principal | ICD-10-CM

## 2020-11-14 DIAGNOSIS — D649 Anemia, unspecified: Principal | ICD-10-CM

## 2020-11-14 DIAGNOSIS — A403 Sepsis due to Streptococcus pneumoniae: Principal | ICD-10-CM

## 2020-11-14 DIAGNOSIS — Z940 Kidney transplant status: Principal | ICD-10-CM

## 2020-11-14 DIAGNOSIS — J13 Pneumonia due to Streptococcus pneumoniae: Principal | ICD-10-CM

## 2020-11-14 DIAGNOSIS — Z794 Long term (current) use of insulin: Principal | ICD-10-CM

## 2020-11-14 DIAGNOSIS — F32A Depression, unspecified: Principal | ICD-10-CM

## 2020-11-14 DIAGNOSIS — E785 Hyperlipidemia, unspecified: Principal | ICD-10-CM

## 2020-11-14 DIAGNOSIS — E875 Hyperkalemia: Principal | ICD-10-CM

## 2020-11-14 DIAGNOSIS — F1410 Cocaine abuse, uncomplicated: Principal | ICD-10-CM

## 2020-11-14 DIAGNOSIS — G4733 Obstructive sleep apnea (adult) (pediatric): Principal | ICD-10-CM

## 2020-11-14 DIAGNOSIS — I509 Heart failure, unspecified: Principal | ICD-10-CM

## 2020-11-14 DIAGNOSIS — B027 Disseminated zoster: Principal | ICD-10-CM

## 2020-11-14 DIAGNOSIS — I129 Hypertensive chronic kidney disease with stage 1 through stage 4 chronic kidney disease, or unspecified chronic kidney disease: Principal | ICD-10-CM

## 2020-11-14 DIAGNOSIS — H409 Unspecified glaucoma: Principal | ICD-10-CM

## 2020-11-14 DIAGNOSIS — N184 Chronic kidney disease, stage 4 (severe): Principal | ICD-10-CM

## 2020-11-14 DIAGNOSIS — Z8673 Personal history of transient ischemic attack (TIA), and cerebral infarction without residual deficits: Principal | ICD-10-CM

## 2020-11-14 DIAGNOSIS — E8770 Fluid overload, unspecified: Principal | ICD-10-CM

## 2020-11-14 DIAGNOSIS — Z7982 Long term (current) use of aspirin: Principal | ICD-10-CM

## 2020-11-14 DIAGNOSIS — Z20822 Contact with and (suspected) exposure to covid-19: Principal | ICD-10-CM

## 2020-11-14 DIAGNOSIS — E1122 Type 2 diabetes mellitus with diabetic chronic kidney disease: Principal | ICD-10-CM

## 2020-11-14 DIAGNOSIS — L03311 Cellulitis of abdominal wall: Principal | ICD-10-CM

## 2020-11-14 DIAGNOSIS — Z9884 Bariatric surgery status: Principal | ICD-10-CM

## 2020-11-14 DIAGNOSIS — Z6841 Body Mass Index (BMI) 40.0 and over, adult: Principal | ICD-10-CM

## 2020-11-14 DIAGNOSIS — F1021 Alcohol dependence, in remission: Principal | ICD-10-CM

## 2020-11-14 DIAGNOSIS — H938X9 Other specified disorders of ear, unspecified ear: Principal | ICD-10-CM

## 2020-11-14 DIAGNOSIS — D849 Immunodeficiency, unspecified: Principal | ICD-10-CM

## 2020-11-14 DIAGNOSIS — Z87891 Personal history of nicotine dependence: Principal | ICD-10-CM

## 2020-11-17 ENCOUNTER — Telehealth: Payer: Self-pay

## 2020-11-17 NOTE — Telephone Encounter (Signed)
Copied from [place] [id number]. Topic: General - Other >> Nov 17, 2020 12:34 PM Yvette Rack wrote: Reason for CRM: Shelly with [facility] stated once pt discharges he will receive enhanced home health for up to 30 days and then pt will be notified to schedule appt with pcp. Cb# [phone]

## 2020-11-17 NOTE — Telephone Encounter (Signed)
Noted, yes will need visit as has only had encounter to establish care visit virtually and have not met face to face.

## 2020-11-19 MED ORDER — LACTULOSE 10 GRAM/15 ML ORAL SOLUTION
ORAL | 0 refills | 30 days | Status: CN
Start: 2020-11-19 — End: 2020-12-19

## 2020-11-19 MED ORDER — LEVOFLOXACIN 500 MG TABLET: 500 mg | tablet | 0 refills | 8 days | Status: AC

## 2020-11-19 MED ORDER — SODIUM ZIRCONIUM CYCLOSILICATE 5 GRAM ORAL POWDER PACKET
PACK | Freq: Every day | ORAL | 0 refills | 30 days | Status: CN
Start: 2020-11-19 — End: 2020-12-19

## 2020-11-19 MED ORDER — BUMETANIDE 1 MG TABLET: 2 mg | tablet | 2 refills | 0 days | Status: AC

## 2020-11-19 MED ORDER — BUMETANIDE 1 MG TABLET
ORAL_TABLET | ORAL | 2 refills | 0.00000 days | Status: CP
Start: 2020-11-19 — End: 2020-12-19
  Filled 2020-11-19: qty 180, 30d supply, fill #0

## 2020-11-19 MED ORDER — CARBAMIDE PEROXIDE 6.5 % EAR DROPS
Freq: Two times a day (BID) | OTIC | 0 refills | 0.00000 days | Status: CP
Start: 2020-11-19 — End: ?

## 2020-11-19 MED ORDER — CARBAMIDE PEROXIDE 6.5 % EAR DROPS: 5 [drp] | mL | Freq: Two times a day (BID) | 0 refills | 0 days | Status: AC

## 2020-11-19 MED ORDER — VALACYCLOVIR 500 MG TABLET
0 days | Status: CN
Start: 2020-11-19 — End: ?

## 2020-11-19 MED FILL — LEVOFLOXACIN 500 MG TABLET: 8 days supply | Qty: 4 | Fill #0 | Status: AC

## 2020-11-19 MED FILL — BUMETANIDE 1 MG TABLET: 30 days supply | Qty: 180 | Fill #0 | Status: AC

## 2020-11-20 DIAGNOSIS — F1910 Other psychoactive substance abuse, uncomplicated: Principal | ICD-10-CM

## 2020-11-20 DIAGNOSIS — I129 Hypertensive chronic kidney disease with stage 1 through stage 4 chronic kidney disease, or unspecified chronic kidney disease: Principal | ICD-10-CM

## 2020-11-20 DIAGNOSIS — I313 Pericardial effusion (noninflammatory): Principal | ICD-10-CM

## 2020-11-20 DIAGNOSIS — G4733 Obstructive sleep apnea (adult) (pediatric): Principal | ICD-10-CM

## 2020-11-20 DIAGNOSIS — B027 Disseminated zoster: Principal | ICD-10-CM

## 2020-11-20 DIAGNOSIS — Z8673 Personal history of transient ischemic attack (TIA), and cerebral infarction without residual deficits: Principal | ICD-10-CM

## 2020-11-20 DIAGNOSIS — F32A Depression, unspecified: Principal | ICD-10-CM

## 2020-11-20 DIAGNOSIS — Z6836 Body mass index (BMI) 36.0-36.9, adult: Principal | ICD-10-CM

## 2020-11-20 DIAGNOSIS — Z9884 Bariatric surgery status: Principal | ICD-10-CM

## 2020-11-20 DIAGNOSIS — E8770 Fluid overload, unspecified: Principal | ICD-10-CM

## 2020-11-20 DIAGNOSIS — Z7952 Long term (current) use of systemic steroids: Principal | ICD-10-CM

## 2020-11-20 DIAGNOSIS — Z8701 Personal history of pneumonia (recurrent): Principal | ICD-10-CM

## 2020-11-20 DIAGNOSIS — E1169 Type 2 diabetes mellitus with other specified complication: Principal | ICD-10-CM

## 2020-11-20 DIAGNOSIS — N184 Chronic kidney disease, stage 4 (severe): Principal | ICD-10-CM

## 2020-11-20 DIAGNOSIS — E1122 Type 2 diabetes mellitus with diabetic chronic kidney disease: Principal | ICD-10-CM

## 2020-11-20 DIAGNOSIS — E668 Other obesity: Principal | ICD-10-CM

## 2020-11-20 DIAGNOSIS — D631 Anemia in chronic kidney disease: Principal | ICD-10-CM

## 2020-11-20 DIAGNOSIS — F1021 Alcohol dependence, in remission: Principal | ICD-10-CM

## 2020-11-20 DIAGNOSIS — R808 Other proteinuria: Principal | ICD-10-CM

## 2020-11-20 DIAGNOSIS — E875 Hyperkalemia: Principal | ICD-10-CM

## 2020-11-20 DIAGNOSIS — E1129 Type 2 diabetes mellitus with other diabetic kidney complication: Principal | ICD-10-CM

## 2020-11-20 DIAGNOSIS — D849 Immunodeficiency, unspecified: Principal | ICD-10-CM

## 2020-11-20 DIAGNOSIS — E1136 Type 2 diabetes mellitus with diabetic cataract: Principal | ICD-10-CM

## 2020-11-20 DIAGNOSIS — Z940 Kidney transplant status: Principal | ICD-10-CM

## 2020-11-20 DIAGNOSIS — H40001 Preglaucoma, unspecified, right eye: Principal | ICD-10-CM

## 2020-11-20 MED ORDER — LACTULOSE 10 GRAM/15 ML ORAL SOLUTION
ORAL | 0 refills | 30.00000 days | Status: CP
Start: 2020-11-20 — End: 2020-12-26

## 2020-11-20 MED ORDER — LEVOFLOXACIN 500 MG TABLET
ORAL_TABLET | ORAL | 0 refills | 8.00000 days | Status: CP
Start: 2020-11-20 — End: 2020-11-27
  Filled 2020-11-19: qty 4, 8d supply, fill #0

## 2020-11-21 ENCOUNTER — Encounter: Admit: 2020-11-21 | Discharge: 2020-11-24 | Payer: MEDICARE

## 2020-11-21 ENCOUNTER — Inpatient Hospital Stay: Admit: 2020-11-21 | Discharge: 2020-11-24 | Payer: MEDICARE

## 2020-11-21 ENCOUNTER — Telehealth: Admit: 2020-11-21 | Discharge: 2020-11-22 | Payer: MEDICARE | Attending: Family | Primary: Family

## 2020-11-21 DIAGNOSIS — N184 Chronic kidney disease, stage 4 (severe): Principal | ICD-10-CM

## 2020-11-21 DIAGNOSIS — R808 Other proteinuria: Principal | ICD-10-CM

## 2020-11-21 DIAGNOSIS — E1136 Type 2 diabetes mellitus with diabetic cataract: Principal | ICD-10-CM

## 2020-11-21 DIAGNOSIS — E1142 Type 2 diabetes mellitus with diabetic polyneuropathy: Principal | ICD-10-CM

## 2020-11-21 DIAGNOSIS — Z6836 Body mass index (BMI) 36.0-36.9, adult: Principal | ICD-10-CM

## 2020-11-21 DIAGNOSIS — F1021 Alcohol dependence, in remission: Principal | ICD-10-CM

## 2020-11-21 DIAGNOSIS — I10 Essential (primary) hypertension: Principal | ICD-10-CM

## 2020-11-21 DIAGNOSIS — F1910 Other psychoactive substance abuse, uncomplicated: Principal | ICD-10-CM

## 2020-11-21 DIAGNOSIS — Z940 Kidney transplant status: Principal | ICD-10-CM

## 2020-11-21 DIAGNOSIS — D631 Anemia in chronic kidney disease: Principal | ICD-10-CM

## 2020-11-21 DIAGNOSIS — Z9884 Bariatric surgery status: Principal | ICD-10-CM

## 2020-11-21 DIAGNOSIS — Z8701 Personal history of pneumonia (recurrent): Principal | ICD-10-CM

## 2020-11-21 DIAGNOSIS — B027 Disseminated zoster: Principal | ICD-10-CM

## 2020-11-21 DIAGNOSIS — I129 Hypertensive chronic kidney disease with stage 1 through stage 4 chronic kidney disease, or unspecified chronic kidney disease: Principal | ICD-10-CM

## 2020-11-21 DIAGNOSIS — E1122 Type 2 diabetes mellitus with diabetic chronic kidney disease: Principal | ICD-10-CM

## 2020-11-21 DIAGNOSIS — E875 Hyperkalemia: Principal | ICD-10-CM

## 2020-11-21 DIAGNOSIS — I313 Pericardial effusion (noninflammatory): Principal | ICD-10-CM

## 2020-11-21 DIAGNOSIS — E1169 Type 2 diabetes mellitus with other specified complication: Principal | ICD-10-CM

## 2020-11-21 DIAGNOSIS — E668 Other obesity: Principal | ICD-10-CM

## 2020-11-21 DIAGNOSIS — G4733 Obstructive sleep apnea (adult) (pediatric): Principal | ICD-10-CM

## 2020-11-21 DIAGNOSIS — E1129 Type 2 diabetes mellitus with other diabetic kidney complication: Principal | ICD-10-CM

## 2020-11-21 DIAGNOSIS — E8770 Fluid overload, unspecified: Principal | ICD-10-CM

## 2020-11-21 DIAGNOSIS — D849 Immunodeficiency, unspecified: Principal | ICD-10-CM

## 2020-11-21 DIAGNOSIS — F32A Depression, unspecified: Principal | ICD-10-CM

## 2020-11-21 DIAGNOSIS — Z8673 Personal history of transient ischemic attack (TIA), and cerebral infarction without residual deficits: Principal | ICD-10-CM

## 2020-11-21 DIAGNOSIS — Z7952 Long term (current) use of systemic steroids: Principal | ICD-10-CM

## 2020-11-21 DIAGNOSIS — H40001 Preglaucoma, unspecified, right eye: Principal | ICD-10-CM

## 2020-11-22 DIAGNOSIS — E1129 Type 2 diabetes mellitus with other diabetic kidney complication: Principal | ICD-10-CM

## 2020-11-22 DIAGNOSIS — E8770 Fluid overload, unspecified: Principal | ICD-10-CM

## 2020-11-22 DIAGNOSIS — B027 Disseminated zoster: Principal | ICD-10-CM

## 2020-11-22 DIAGNOSIS — Z7952 Long term (current) use of systemic steroids: Principal | ICD-10-CM

## 2020-11-22 DIAGNOSIS — I129 Hypertensive chronic kidney disease with stage 1 through stage 4 chronic kidney disease, or unspecified chronic kidney disease: Principal | ICD-10-CM

## 2020-11-22 DIAGNOSIS — I313 Pericardial effusion (noninflammatory): Principal | ICD-10-CM

## 2020-11-22 DIAGNOSIS — Z6836 Body mass index (BMI) 36.0-36.9, adult: Principal | ICD-10-CM

## 2020-11-22 DIAGNOSIS — D631 Anemia in chronic kidney disease: Principal | ICD-10-CM

## 2020-11-22 DIAGNOSIS — G4733 Obstructive sleep apnea (adult) (pediatric): Principal | ICD-10-CM

## 2020-11-22 DIAGNOSIS — Z8701 Personal history of pneumonia (recurrent): Principal | ICD-10-CM

## 2020-11-22 DIAGNOSIS — N184 Chronic kidney disease, stage 4 (severe): Principal | ICD-10-CM

## 2020-11-22 DIAGNOSIS — F32A Depression, unspecified: Principal | ICD-10-CM

## 2020-11-22 DIAGNOSIS — F1910 Other psychoactive substance abuse, uncomplicated: Principal | ICD-10-CM

## 2020-11-22 DIAGNOSIS — E1136 Type 2 diabetes mellitus with diabetic cataract: Principal | ICD-10-CM

## 2020-11-22 DIAGNOSIS — Z940 Kidney transplant status: Principal | ICD-10-CM

## 2020-11-22 DIAGNOSIS — R808 Other proteinuria: Principal | ICD-10-CM

## 2020-11-22 DIAGNOSIS — D849 Immunodeficiency, unspecified: Principal | ICD-10-CM

## 2020-11-22 DIAGNOSIS — E1169 Type 2 diabetes mellitus with other specified complication: Principal | ICD-10-CM

## 2020-11-22 DIAGNOSIS — E668 Other obesity: Principal | ICD-10-CM

## 2020-11-22 DIAGNOSIS — Z8673 Personal history of transient ischemic attack (TIA), and cerebral infarction without residual deficits: Principal | ICD-10-CM

## 2020-11-22 DIAGNOSIS — H40001 Preglaucoma, unspecified, right eye: Principal | ICD-10-CM

## 2020-11-22 DIAGNOSIS — F1021 Alcohol dependence, in remission: Principal | ICD-10-CM

## 2020-11-22 DIAGNOSIS — Z9884 Bariatric surgery status: Principal | ICD-10-CM

## 2020-11-22 DIAGNOSIS — E1122 Type 2 diabetes mellitus with diabetic chronic kidney disease: Principal | ICD-10-CM

## 2020-11-22 DIAGNOSIS — E875 Hyperkalemia: Principal | ICD-10-CM

## 2020-11-24 DIAGNOSIS — Z6836 Body mass index (BMI) 36.0-36.9, adult: Principal | ICD-10-CM

## 2020-11-24 DIAGNOSIS — I313 Pericardial effusion (noninflammatory): Principal | ICD-10-CM

## 2020-11-24 DIAGNOSIS — F32A Depression, unspecified: Principal | ICD-10-CM

## 2020-11-24 DIAGNOSIS — E1136 Type 2 diabetes mellitus with diabetic cataract: Principal | ICD-10-CM

## 2020-11-24 DIAGNOSIS — R808 Other proteinuria: Principal | ICD-10-CM

## 2020-11-24 DIAGNOSIS — F1021 Alcohol dependence, in remission: Principal | ICD-10-CM

## 2020-11-24 DIAGNOSIS — D631 Anemia in chronic kidney disease: Principal | ICD-10-CM

## 2020-11-24 DIAGNOSIS — D849 Immunodeficiency, unspecified: Principal | ICD-10-CM

## 2020-11-24 DIAGNOSIS — E668 Other obesity: Principal | ICD-10-CM

## 2020-11-24 DIAGNOSIS — H40001 Preglaucoma, unspecified, right eye: Principal | ICD-10-CM

## 2020-11-24 DIAGNOSIS — E875 Hyperkalemia: Principal | ICD-10-CM

## 2020-11-24 DIAGNOSIS — I129 Hypertensive chronic kidney disease with stage 1 through stage 4 chronic kidney disease, or unspecified chronic kidney disease: Principal | ICD-10-CM

## 2020-11-24 DIAGNOSIS — G4733 Obstructive sleep apnea (adult) (pediatric): Principal | ICD-10-CM

## 2020-11-24 DIAGNOSIS — F1910 Other psychoactive substance abuse, uncomplicated: Principal | ICD-10-CM

## 2020-11-24 DIAGNOSIS — Z940 Kidney transplant status: Principal | ICD-10-CM

## 2020-11-24 DIAGNOSIS — Z9884 Bariatric surgery status: Principal | ICD-10-CM

## 2020-11-24 DIAGNOSIS — N184 Chronic kidney disease, stage 4 (severe): Principal | ICD-10-CM

## 2020-11-24 DIAGNOSIS — B027 Disseminated zoster: Principal | ICD-10-CM

## 2020-11-24 DIAGNOSIS — E8770 Fluid overload, unspecified: Principal | ICD-10-CM

## 2020-11-24 DIAGNOSIS — E1129 Type 2 diabetes mellitus with other diabetic kidney complication: Principal | ICD-10-CM

## 2020-11-24 DIAGNOSIS — E1122 Type 2 diabetes mellitus with diabetic chronic kidney disease: Principal | ICD-10-CM

## 2020-11-24 DIAGNOSIS — E1169 Type 2 diabetes mellitus with other specified complication: Principal | ICD-10-CM

## 2020-11-24 DIAGNOSIS — Z8701 Personal history of pneumonia (recurrent): Principal | ICD-10-CM

## 2020-11-24 DIAGNOSIS — Z8673 Personal history of transient ischemic attack (TIA), and cerebral infarction without residual deficits: Principal | ICD-10-CM

## 2020-11-24 DIAGNOSIS — Z7952 Long term (current) use of systemic steroids: Principal | ICD-10-CM

## 2020-11-25 DIAGNOSIS — I129 Hypertensive chronic kidney disease with stage 1 through stage 4 chronic kidney disease, or unspecified chronic kidney disease: Principal | ICD-10-CM

## 2020-11-25 DIAGNOSIS — E1136 Type 2 diabetes mellitus with diabetic cataract: Principal | ICD-10-CM

## 2020-11-25 DIAGNOSIS — Z8701 Personal history of pneumonia (recurrent): Principal | ICD-10-CM

## 2020-11-25 DIAGNOSIS — Z8673 Personal history of transient ischemic attack (TIA), and cerebral infarction without residual deficits: Principal | ICD-10-CM

## 2020-11-25 DIAGNOSIS — E875 Hyperkalemia: Principal | ICD-10-CM

## 2020-11-25 DIAGNOSIS — Z940 Kidney transplant status: Principal | ICD-10-CM

## 2020-11-25 DIAGNOSIS — E1122 Type 2 diabetes mellitus with diabetic chronic kidney disease: Principal | ICD-10-CM

## 2020-11-25 DIAGNOSIS — G4733 Obstructive sleep apnea (adult) (pediatric): Principal | ICD-10-CM

## 2020-11-25 DIAGNOSIS — F32A Depression, unspecified: Principal | ICD-10-CM

## 2020-11-25 DIAGNOSIS — Z6836 Body mass index (BMI) 36.0-36.9, adult: Principal | ICD-10-CM

## 2020-11-25 DIAGNOSIS — E1169 Type 2 diabetes mellitus with other specified complication: Principal | ICD-10-CM

## 2020-11-25 DIAGNOSIS — E1129 Type 2 diabetes mellitus with other diabetic kidney complication: Principal | ICD-10-CM

## 2020-11-25 DIAGNOSIS — N184 Chronic kidney disease, stage 4 (severe): Principal | ICD-10-CM

## 2020-11-25 DIAGNOSIS — F1021 Alcohol dependence, in remission: Principal | ICD-10-CM

## 2020-11-25 DIAGNOSIS — R808 Other proteinuria: Principal | ICD-10-CM

## 2020-11-25 DIAGNOSIS — E8770 Fluid overload, unspecified: Principal | ICD-10-CM

## 2020-11-25 DIAGNOSIS — Z7952 Long term (current) use of systemic steroids: Principal | ICD-10-CM

## 2020-11-25 DIAGNOSIS — D849 Immunodeficiency, unspecified: Principal | ICD-10-CM

## 2020-11-25 DIAGNOSIS — Z9884 Bariatric surgery status: Principal | ICD-10-CM

## 2020-11-25 DIAGNOSIS — E668 Other obesity: Principal | ICD-10-CM

## 2020-11-25 DIAGNOSIS — I313 Pericardial effusion (noninflammatory): Principal | ICD-10-CM

## 2020-11-25 DIAGNOSIS — F1910 Other psychoactive substance abuse, uncomplicated: Principal | ICD-10-CM

## 2020-11-25 DIAGNOSIS — D631 Anemia in chronic kidney disease: Principal | ICD-10-CM

## 2020-11-25 DIAGNOSIS — B027 Disseminated zoster: Principal | ICD-10-CM

## 2020-11-25 DIAGNOSIS — H40001 Preglaucoma, unspecified, right eye: Principal | ICD-10-CM

## 2020-11-27 ENCOUNTER — Ambulatory Visit: Admit: 2020-11-27 | Discharge: 2020-11-27 | Payer: MEDICARE

## 2020-11-27 ENCOUNTER — Ambulatory Visit
Admit: 2020-11-27 | Discharge: 2020-11-27 | Payer: MEDICARE | Attending: Infectious Disease | Primary: Infectious Disease

## 2020-11-27 DIAGNOSIS — Z7982 Long term (current) use of aspirin: Principal | ICD-10-CM

## 2020-11-27 DIAGNOSIS — E1122 Type 2 diabetes mellitus with diabetic chronic kidney disease: Principal | ICD-10-CM

## 2020-11-27 DIAGNOSIS — E1136 Type 2 diabetes mellitus with diabetic cataract: Principal | ICD-10-CM

## 2020-11-27 DIAGNOSIS — G4733 Obstructive sleep apnea (adult) (pediatric): Principal | ICD-10-CM

## 2020-11-27 DIAGNOSIS — Z79899 Other long term (current) drug therapy: Principal | ICD-10-CM

## 2020-11-27 DIAGNOSIS — Z7952 Long term (current) use of systemic steroids: Principal | ICD-10-CM

## 2020-11-27 DIAGNOSIS — B027 Disseminated zoster: Principal | ICD-10-CM

## 2020-11-27 DIAGNOSIS — Z8619 Personal history of other infectious and parasitic diseases: Principal | ICD-10-CM

## 2020-11-27 DIAGNOSIS — R808 Other proteinuria: Principal | ICD-10-CM

## 2020-11-27 DIAGNOSIS — F1021 Alcohol dependence, in remission: Principal | ICD-10-CM

## 2020-11-27 DIAGNOSIS — Z8701 Personal history of pneumonia (recurrent): Principal | ICD-10-CM

## 2020-11-27 DIAGNOSIS — J13 Pneumonia due to Streptococcus pneumoniae: Principal | ICD-10-CM

## 2020-11-27 DIAGNOSIS — A403 Sepsis due to Streptococcus pneumoniae: Principal | ICD-10-CM

## 2020-11-27 DIAGNOSIS — Z8673 Personal history of transient ischemic attack (TIA), and cerebral infarction without residual deficits: Principal | ICD-10-CM

## 2020-11-27 DIAGNOSIS — R9431 Abnormal electrocardiogram [ECG] [EKG]: Principal | ICD-10-CM

## 2020-11-27 DIAGNOSIS — R7881 Bacteremia: Principal | ICD-10-CM

## 2020-11-27 DIAGNOSIS — F1910 Other psychoactive substance abuse, uncomplicated: Principal | ICD-10-CM

## 2020-11-27 DIAGNOSIS — N186 End stage renal disease: Principal | ICD-10-CM

## 2020-11-27 DIAGNOSIS — H7091 Unspecified mastoiditis, right ear: Principal | ICD-10-CM

## 2020-11-27 DIAGNOSIS — R911 Solitary pulmonary nodule: Principal | ICD-10-CM

## 2020-11-27 DIAGNOSIS — Z6836 Body mass index (BMI) 36.0-36.9, adult: Principal | ICD-10-CM

## 2020-11-27 DIAGNOSIS — H40001 Preglaucoma, unspecified, right eye: Principal | ICD-10-CM

## 2020-11-27 DIAGNOSIS — Z9884 Bariatric surgery status: Principal | ICD-10-CM

## 2020-11-27 DIAGNOSIS — F32A Depression, unspecified: Principal | ICD-10-CM

## 2020-11-27 DIAGNOSIS — N184 Chronic kidney disease, stage 4 (severe): Principal | ICD-10-CM

## 2020-11-27 DIAGNOSIS — H9011 Conductive hearing loss, unilateral, right ear, with unrestricted hearing on the contralateral side: Principal | ICD-10-CM

## 2020-11-27 DIAGNOSIS — I120 Hypertensive chronic kidney disease with stage 5 chronic kidney disease or end stage renal disease: Principal | ICD-10-CM

## 2020-11-27 DIAGNOSIS — D631 Anemia in chronic kidney disease: Principal | ICD-10-CM

## 2020-11-27 DIAGNOSIS — D849 Immunodeficiency, unspecified: Principal | ICD-10-CM

## 2020-11-27 DIAGNOSIS — Z940 Kidney transplant status: Principal | ICD-10-CM

## 2020-11-27 DIAGNOSIS — E8770 Fluid overload, unspecified: Principal | ICD-10-CM

## 2020-11-27 DIAGNOSIS — E668 Other obesity: Principal | ICD-10-CM

## 2020-11-27 DIAGNOSIS — E1129 Type 2 diabetes mellitus with other diabetic kidney complication: Principal | ICD-10-CM

## 2020-11-27 DIAGNOSIS — I313 Pericardial effusion (noninflammatory): Principal | ICD-10-CM

## 2020-11-27 DIAGNOSIS — I129 Hypertensive chronic kidney disease with stage 1 through stage 4 chronic kidney disease, or unspecified chronic kidney disease: Principal | ICD-10-CM

## 2020-11-27 DIAGNOSIS — E1169 Type 2 diabetes mellitus with other specified complication: Principal | ICD-10-CM

## 2020-11-27 DIAGNOSIS — E875 Hyperkalemia: Principal | ICD-10-CM

## 2020-11-27 MED ORDER — LEVOFLOXACIN 750 MG TABLET
ORAL_TABLET | ORAL | 0 refills | 28 days | Status: CP
Start: 2020-11-27 — End: 2020-12-25

## 2020-11-27 MED ORDER — POLYETHYLENE GLYCOL 3350 17 GRAM ORAL POWDER PACKET
PACK | Freq: Every day | ORAL | 0 refills | 7 days | Status: CP
Start: 2020-11-27 — End: 2020-12-04

## 2020-11-29 MED ORDER — CLONIDINE HCL 0.3 MG TABLET
ORAL_TABLET | 0 refills | 0 days
Start: 2020-11-29 — End: ?

## 2020-11-30 ENCOUNTER — Ambulatory Visit: Admit: 2020-11-30 | Discharge: 2020-12-01 | Payer: MEDICARE

## 2020-11-30 DIAGNOSIS — H6691 Otitis media, unspecified, right ear: Principal | ICD-10-CM

## 2020-11-30 DIAGNOSIS — Z09 Encounter for follow-up examination after completed treatment for conditions other than malignant neoplasm: Principal | ICD-10-CM

## 2020-11-30 DIAGNOSIS — H7091 Unspecified mastoiditis, right ear: Principal | ICD-10-CM

## 2020-11-30 DIAGNOSIS — A403 Sepsis due to Streptococcus pneumoniae: Principal | ICD-10-CM

## 2020-12-01 DIAGNOSIS — Z9884 Bariatric surgery status: Principal | ICD-10-CM

## 2020-12-01 DIAGNOSIS — F1021 Alcohol dependence, in remission: Principal | ICD-10-CM

## 2020-12-01 DIAGNOSIS — I313 Pericardial effusion (noninflammatory): Principal | ICD-10-CM

## 2020-12-01 DIAGNOSIS — B027 Disseminated zoster: Principal | ICD-10-CM

## 2020-12-01 DIAGNOSIS — E668 Other obesity: Principal | ICD-10-CM

## 2020-12-01 DIAGNOSIS — H40001 Preglaucoma, unspecified, right eye: Principal | ICD-10-CM

## 2020-12-01 DIAGNOSIS — I129 Hypertensive chronic kidney disease with stage 1 through stage 4 chronic kidney disease, or unspecified chronic kidney disease: Principal | ICD-10-CM

## 2020-12-01 DIAGNOSIS — R808 Other proteinuria: Principal | ICD-10-CM

## 2020-12-01 DIAGNOSIS — Z8673 Personal history of transient ischemic attack (TIA), and cerebral infarction without residual deficits: Principal | ICD-10-CM

## 2020-12-01 DIAGNOSIS — Z940 Kidney transplant status: Principal | ICD-10-CM

## 2020-12-01 DIAGNOSIS — F1910 Other psychoactive substance abuse, uncomplicated: Principal | ICD-10-CM

## 2020-12-01 DIAGNOSIS — E1169 Type 2 diabetes mellitus with other specified complication: Principal | ICD-10-CM

## 2020-12-01 DIAGNOSIS — Z7952 Long term (current) use of systemic steroids: Principal | ICD-10-CM

## 2020-12-01 DIAGNOSIS — N184 Chronic kidney disease, stage 4 (severe): Principal | ICD-10-CM

## 2020-12-01 DIAGNOSIS — E1122 Type 2 diabetes mellitus with diabetic chronic kidney disease: Principal | ICD-10-CM

## 2020-12-01 DIAGNOSIS — F32A Depression, unspecified: Principal | ICD-10-CM

## 2020-12-01 DIAGNOSIS — D631 Anemia in chronic kidney disease: Principal | ICD-10-CM

## 2020-12-01 DIAGNOSIS — E875 Hyperkalemia: Principal | ICD-10-CM

## 2020-12-01 DIAGNOSIS — D849 Immunodeficiency, unspecified: Principal | ICD-10-CM

## 2020-12-01 DIAGNOSIS — G4733 Obstructive sleep apnea (adult) (pediatric): Principal | ICD-10-CM

## 2020-12-01 DIAGNOSIS — E8770 Fluid overload, unspecified: Principal | ICD-10-CM

## 2020-12-01 DIAGNOSIS — E1129 Type 2 diabetes mellitus with other diabetic kidney complication: Principal | ICD-10-CM

## 2020-12-01 DIAGNOSIS — Z6836 Body mass index (BMI) 36.0-36.9, adult: Principal | ICD-10-CM

## 2020-12-01 DIAGNOSIS — E1136 Type 2 diabetes mellitus with diabetic cataract: Principal | ICD-10-CM

## 2020-12-01 DIAGNOSIS — Z8701 Personal history of pneumonia (recurrent): Principal | ICD-10-CM

## 2020-12-01 MED ORDER — BUMETANIDE 1 MG TABLET
ORAL_TABLET | 0 refills | 0.00000 days
Start: 2020-12-01 — End: ?

## 2020-12-01 MED ORDER — CLONIDINE HCL 0.3 MG TABLET
ORAL_TABLET | 0 refills | 0 days
Start: 2020-12-01 — End: ?

## 2020-12-04 ENCOUNTER — Ambulatory Visit
Admit: 2020-12-04 | Discharge: 2020-12-05 | Payer: MEDICARE | Attending: Student in an Organized Health Care Education/Training Program | Primary: Student in an Organized Health Care Education/Training Program

## 2020-12-04 DIAGNOSIS — H04123 Dry eye syndrome of bilateral lacrimal glands: Principal | ICD-10-CM

## 2020-12-04 DIAGNOSIS — H401231 Low-tension glaucoma, bilateral, mild stage: Principal | ICD-10-CM

## 2020-12-04 DIAGNOSIS — H40001 Preglaucoma, unspecified, right eye: Principal | ICD-10-CM

## 2020-12-04 MED ORDER — LATANOPROST 0.005 % EYE DROPS
Freq: Every evening | OPHTHALMIC | 6 refills | 50 days | Status: CP
Start: 2020-12-04 — End: 2021-12-04

## 2020-12-12 DIAGNOSIS — E119 Type 2 diabetes mellitus without complications: Principal | ICD-10-CM

## 2020-12-12 DIAGNOSIS — Z940 Kidney transplant status: Principal | ICD-10-CM

## 2020-12-12 DIAGNOSIS — Z79899 Other long term (current) drug therapy: Principal | ICD-10-CM

## 2020-12-13 ENCOUNTER — Ambulatory Visit: Admit: 2020-12-13 | Discharge: 2020-12-14 | Payer: MEDICARE | Attending: Nephrology | Primary: Nephrology

## 2020-12-13 ENCOUNTER — Ambulatory Visit: Admit: 2020-12-13 | Discharge: 2020-12-14 | Payer: MEDICARE

## 2020-12-13 DIAGNOSIS — N184 Chronic kidney disease, stage 4 (severe): Principal | ICD-10-CM

## 2020-12-13 DIAGNOSIS — R300 Dysuria: Principal | ICD-10-CM

## 2020-12-13 DIAGNOSIS — N136 Pyonephrosis: Principal | ICD-10-CM

## 2020-12-13 DIAGNOSIS — K2281 Esophageal polyp: Principal | ICD-10-CM

## 2020-12-13 DIAGNOSIS — J3489 Other specified disorders of nose and nasal sinuses: Principal | ICD-10-CM

## 2020-12-13 DIAGNOSIS — K317 Polyp of stomach and duodenum: Principal | ICD-10-CM

## 2020-12-13 DIAGNOSIS — Z940 Kidney transplant status: Principal | ICD-10-CM

## 2020-12-13 DIAGNOSIS — Z79899 Other long term (current) drug therapy: Principal | ICD-10-CM

## 2020-12-13 DIAGNOSIS — R600 Localized edema: Principal | ICD-10-CM

## 2020-12-13 DIAGNOSIS — Z792 Long term (current) use of antibiotics: Principal | ICD-10-CM

## 2020-12-13 DIAGNOSIS — E119 Type 2 diabetes mellitus without complications: Principal | ICD-10-CM

## 2020-12-13 DIAGNOSIS — I129 Hypertensive chronic kidney disease with stage 1 through stage 4 chronic kidney disease, or unspecified chronic kidney disease: Principal | ICD-10-CM

## 2020-12-13 DIAGNOSIS — J029 Acute pharyngitis, unspecified: Principal | ICD-10-CM

## 2020-12-13 DIAGNOSIS — Z7982 Long term (current) use of aspirin: Principal | ICD-10-CM

## 2020-12-13 DIAGNOSIS — F1410 Cocaine abuse, uncomplicated: Principal | ICD-10-CM

## 2020-12-13 DIAGNOSIS — R809 Proteinuria, unspecified: Principal | ICD-10-CM

## 2020-12-13 DIAGNOSIS — N179 Acute kidney failure, unspecified: Principal | ICD-10-CM

## 2020-12-13 DIAGNOSIS — Z794 Long term (current) use of insulin: Principal | ICD-10-CM

## 2020-12-13 DIAGNOSIS — R3915 Urgency of urination: Principal | ICD-10-CM

## 2020-12-13 DIAGNOSIS — G4733 Obstructive sleep apnea (adult) (pediatric): Principal | ICD-10-CM

## 2020-12-16 MED ORDER — LACTULOSE 10 GRAM/15 ML ORAL SOLUTION
0 refills | 0.00000 days
Start: 2020-12-16 — End: ?

## 2020-12-21 MED ORDER — BUMETANIDE 1 MG TABLET
ORAL_TABLET | ORAL | 2 refills | 30.00000 days | Status: CP
Start: 2020-12-21 — End: 2021-02-19

## 2020-12-26 MED ORDER — LACTULOSE 10 GRAM/15 ML ORAL SOLUTION
ORAL | 0 refills | 30 days | Status: CP
Start: 2020-12-26 — End: 2021-01-25

## 2021-01-03 ENCOUNTER — Ambulatory Visit: Admit: 2021-01-03 | Discharge: 2021-01-04 | Payer: MEDICARE

## 2021-01-03 DIAGNOSIS — Z79899 Other long term (current) drug therapy: Principal | ICD-10-CM

## 2021-01-03 DIAGNOSIS — Z940 Kidney transplant status: Principal | ICD-10-CM

## 2021-01-05 DIAGNOSIS — D849 Immunodeficiency, unspecified: Principal | ICD-10-CM

## 2021-01-05 DIAGNOSIS — Z940 Kidney transplant status: Principal | ICD-10-CM

## 2021-01-05 MED ORDER — TACROLIMUS 1 MG CAPSULE, IMMEDIATE-RELEASE
ORAL_CAPSULE | ORAL | 11 refills | 0.00000 days | Status: CP
Start: 2021-01-05 — End: ?

## 2021-01-08 ENCOUNTER — Ambulatory Visit
Admit: 2021-01-08 | Discharge: 2021-01-09 | Payer: MEDICARE | Attending: Student in an Organized Health Care Education/Training Program | Primary: Student in an Organized Health Care Education/Training Program

## 2021-01-08 DIAGNOSIS — H40003 Preglaucoma, unspecified, bilateral: Principal | ICD-10-CM

## 2021-01-09 ENCOUNTER — Telehealth
Admit: 2021-01-09 | Discharge: 2021-01-10 | Payer: MEDICARE | Attending: Addiction (Substance Use Disorder) | Primary: Addiction (Substance Use Disorder)

## 2021-01-09 DIAGNOSIS — F1011 Alcohol abuse, in remission: Principal | ICD-10-CM

## 2021-01-09 DIAGNOSIS — F1220 Cannabis dependence, uncomplicated: Principal | ICD-10-CM

## 2021-01-09 DIAGNOSIS — Z87891 Personal history of nicotine dependence: Principal | ICD-10-CM

## 2021-01-09 DIAGNOSIS — F1420 Cocaine dependence, uncomplicated: Principal | ICD-10-CM

## 2021-01-12 ENCOUNTER — Ambulatory Visit: Admit: 2021-01-12 | Discharge: 2021-01-13 | Payer: MEDICARE | Attending: Professional | Primary: Professional

## 2021-01-12 DIAGNOSIS — F1420 Cocaine dependence, uncomplicated: Principal | ICD-10-CM

## 2021-01-14 MED ORDER — GABAPENTIN 100 MG CAPSULE
ORAL_CAPSULE | 0 refills | 0.00000 days
Start: 2021-01-14 — End: ?

## 2021-01-16 ENCOUNTER — Ambulatory Visit
Admit: 2021-01-16 | Discharge: 2021-01-17 | Payer: MEDICARE | Attending: Addiction (Substance Use Disorder) | Primary: Addiction (Substance Use Disorder)

## 2021-01-16 DIAGNOSIS — F1420 Cocaine dependence, uncomplicated: Principal | ICD-10-CM

## 2021-01-16 DIAGNOSIS — F1220 Cannabis dependence, uncomplicated: Principal | ICD-10-CM

## 2021-01-16 DIAGNOSIS — F1011 Alcohol abuse, in remission: Principal | ICD-10-CM

## 2021-01-17 ENCOUNTER — Ambulatory Visit: Admit: 2021-01-17 | Discharge: 2021-01-18 | Payer: MEDICARE

## 2021-01-19 ENCOUNTER — Ambulatory Visit: Admit: 2021-01-19 | Discharge: 2021-01-20 | Payer: MEDICARE | Attending: Professional | Primary: Professional

## 2021-01-19 DIAGNOSIS — F1011 Alcohol abuse, in remission: Principal | ICD-10-CM

## 2021-01-19 DIAGNOSIS — F1420 Cocaine dependence, uncomplicated: Principal | ICD-10-CM

## 2021-01-19 DIAGNOSIS — F1220 Cannabis dependence, uncomplicated: Principal | ICD-10-CM

## 2021-01-23 ENCOUNTER — Ambulatory Visit
Admit: 2021-01-23 | Discharge: 2021-01-24 | Payer: MEDICARE | Attending: Addiction (Substance Use Disorder) | Primary: Addiction (Substance Use Disorder)

## 2021-01-23 DIAGNOSIS — F1011 Alcohol abuse, in remission: Principal | ICD-10-CM

## 2021-01-23 DIAGNOSIS — F1220 Cannabis dependence, uncomplicated: Principal | ICD-10-CM

## 2021-01-23 DIAGNOSIS — F1420 Cocaine dependence, uncomplicated: Principal | ICD-10-CM

## 2021-01-24 DIAGNOSIS — H9190 Unspecified hearing loss, unspecified ear: Principal | ICD-10-CM

## 2021-01-25 ENCOUNTER — Ambulatory Visit: Admit: 2021-01-25 | Discharge: 2021-01-26 | Payer: MEDICARE | Attending: Audiologist | Primary: Audiologist

## 2021-01-25 ENCOUNTER — Ambulatory Visit: Admit: 2021-01-25 | Discharge: 2021-01-26 | Payer: MEDICARE

## 2021-01-25 DIAGNOSIS — R82998 Other abnormal findings in urine: Principal | ICD-10-CM

## 2021-01-25 DIAGNOSIS — Z940 Kidney transplant status: Principal | ICD-10-CM

## 2021-01-25 DIAGNOSIS — Z79899 Other long term (current) drug therapy: Principal | ICD-10-CM

## 2021-01-25 DIAGNOSIS — D849 Immunodeficiency, unspecified: Principal | ICD-10-CM

## 2021-01-25 MED ORDER — LACTULOSE 10 GRAM/15 ML ORAL SOLUTION
0 refills | 0 days
Start: 2021-01-25 — End: ?

## 2021-01-30 ENCOUNTER — Ambulatory Visit
Admit: 2021-01-30 | Discharge: 2021-01-31 | Payer: MEDICARE | Attending: Addiction (Substance Use Disorder) | Primary: Addiction (Substance Use Disorder)

## 2021-01-30 DIAGNOSIS — F1011 Alcohol abuse, in remission: Principal | ICD-10-CM

## 2021-01-30 DIAGNOSIS — F1220 Cannabis dependence, uncomplicated: Principal | ICD-10-CM

## 2021-01-30 DIAGNOSIS — F1420 Cocaine dependence, uncomplicated: Principal | ICD-10-CM

## 2021-01-31 MED ORDER — GABAPENTIN 100 MG CAPSULE
ORAL_CAPSULE | 0 refills | 0.00000 days
Start: 2021-01-31 — End: ?

## 2021-02-02 ENCOUNTER — Ambulatory Visit: Admit: 2021-02-02 | Discharge: 2021-02-03 | Payer: MEDICARE | Attending: Professional | Primary: Professional

## 2021-02-02 ENCOUNTER — Telehealth
Admit: 2021-02-02 | Discharge: 2021-02-03 | Payer: MEDICARE | Attending: Addiction (Substance Use Disorder) | Primary: Addiction (Substance Use Disorder)

## 2021-02-02 DIAGNOSIS — Z01818 Encounter for other preprocedural examination: Principal | ICD-10-CM

## 2021-02-02 DIAGNOSIS — F1910 Other psychoactive substance abuse, uncomplicated: Principal | ICD-10-CM

## 2021-02-02 DIAGNOSIS — F1011 Alcohol abuse, in remission: Principal | ICD-10-CM

## 2021-02-02 DIAGNOSIS — F1220 Cannabis dependence, uncomplicated: Principal | ICD-10-CM

## 2021-02-02 DIAGNOSIS — F1420 Cocaine dependence, uncomplicated: Principal | ICD-10-CM

## 2021-02-02 DIAGNOSIS — F1411 Cocaine abuse, in remission: Principal | ICD-10-CM

## 2021-02-02 DIAGNOSIS — Z7151 Drug abuse counseling and surveillance of drug abuser: Principal | ICD-10-CM

## 2021-02-02 DIAGNOSIS — N186 End stage renal disease: Principal | ICD-10-CM

## 2021-02-06 ENCOUNTER — Ambulatory Visit
Admit: 2021-02-06 | Discharge: 2021-02-07 | Payer: MEDICARE | Attending: Addiction (Substance Use Disorder) | Primary: Addiction (Substance Use Disorder)

## 2021-02-06 DIAGNOSIS — F1011 Alcohol abuse, in remission: Principal | ICD-10-CM

## 2021-02-06 DIAGNOSIS — F1420 Cocaine dependence, uncomplicated: Principal | ICD-10-CM

## 2021-02-06 DIAGNOSIS — F1220 Cannabis dependence, uncomplicated: Principal | ICD-10-CM

## 2021-02-13 ENCOUNTER — Ambulatory Visit
Admit: 2021-02-13 | Discharge: 2021-02-14 | Payer: MEDICARE | Attending: Addiction (Substance Use Disorder) | Primary: Addiction (Substance Use Disorder)

## 2021-02-13 DIAGNOSIS — F1220 Cannabis dependence, uncomplicated: Principal | ICD-10-CM

## 2021-02-13 DIAGNOSIS — F1011 Alcohol abuse, in remission: Principal | ICD-10-CM

## 2021-02-13 DIAGNOSIS — F1420 Cocaine dependence, uncomplicated: Principal | ICD-10-CM

## 2021-02-16 ENCOUNTER — Ambulatory Visit: Admit: 2021-02-16 | Discharge: 2021-02-17 | Payer: MEDICARE | Attending: Professional | Primary: Professional

## 2021-02-22 ENCOUNTER — Encounter
Admit: 2021-02-22 | Discharge: 2021-02-25 | Disposition: A | Discharge status: Home / Self Care | Payer: MEDICARE | Admitting: Nephrology

## 2021-02-22 ENCOUNTER — Ambulatory Visit
Admit: 2021-02-22 | Discharge: 2021-02-25 | Disposition: A | Discharge status: Home / Self Care | Payer: MEDICARE | Admitting: Nephrology

## 2021-02-25 MED ORDER — SEVELAMER CARBONATE 800 MG TABLET
ORAL_TABLET | Freq: Three times a day (TID) | ORAL | 0 refills | 30 days | Status: CP
Start: 2021-02-25 — End: 2021-03-27

## 2021-02-25 MED ORDER — TACROLIMUS 1 MG CAPSULE, IMMEDIATE-RELEASE
ORAL_CAPSULE | 11 refills | 0 days | Status: CP
Start: 2021-02-25 — End: ?

## 2021-02-27 ENCOUNTER — Ambulatory Visit: Admit: 2021-02-27 | Discharge: 2021-02-28 | Payer: MEDICARE

## 2021-02-27 DIAGNOSIS — Z940 Kidney transplant status: Principal | ICD-10-CM

## 2021-03-07 ENCOUNTER — Telehealth
Admit: 2021-03-07 | Discharge: 2021-03-08 | Payer: MEDICARE | Attending: Addiction (Substance Use Disorder) | Primary: Addiction (Substance Use Disorder)

## 2021-03-09 ENCOUNTER — Ambulatory Visit: Admit: 2021-03-09 | Discharge: 2021-03-10 | Payer: MEDICARE | Attending: Professional | Primary: Professional

## 2021-03-13 ENCOUNTER — Ambulatory Visit
Admit: 2021-03-13 | Discharge: 2021-03-14 | Payer: MEDICARE | Attending: Addiction (Substance Use Disorder) | Primary: Addiction (Substance Use Disorder)

## 2021-03-15 ENCOUNTER — Ambulatory Visit: Admit: 2021-03-15 | Payer: MEDICARE | Attending: Audiologist | Primary: Audiologist

## 2021-03-16 ENCOUNTER — Ambulatory Visit: Admit: 2021-03-16 | Discharge: 2021-03-17 | Payer: MEDICARE | Attending: Professional | Primary: Professional

## 2021-03-21 DIAGNOSIS — N2581 Secondary hyperparathyroidism of renal origin: Principal | ICD-10-CM

## 2021-03-21 DIAGNOSIS — Z940 Kidney transplant status: Principal | ICD-10-CM

## 2021-03-21 DIAGNOSIS — F1411 Cocaine abuse, in remission: Principal | ICD-10-CM

## 2021-03-21 DIAGNOSIS — Z79899 Other long term (current) drug therapy: Principal | ICD-10-CM

## 2021-03-21 DIAGNOSIS — D631 Anemia in chronic kidney disease: Principal | ICD-10-CM

## 2021-03-21 DIAGNOSIS — N059 Unspecified nephritic syndrome with unspecified morphologic changes: Principal | ICD-10-CM

## 2021-03-21 DIAGNOSIS — T8619 Other complication of kidney transplant: Principal | ICD-10-CM

## 2021-03-21 DIAGNOSIS — N185 Chronic kidney disease, stage 5: Principal | ICD-10-CM

## 2021-03-21 DIAGNOSIS — F1910 Other psychoactive substance abuse, uncomplicated: Principal | ICD-10-CM

## 2021-03-21 DIAGNOSIS — N186 End stage renal disease: Principal | ICD-10-CM

## 2021-03-21 DIAGNOSIS — Z01818 Encounter for other preprocedural examination: Principal | ICD-10-CM

## 2021-03-21 MED ORDER — MYCOPHENOLATE MOFETIL 500 MG TABLET
ORAL_TABLET | Freq: Two times a day (BID) | ORAL | 11 refills | 30 days | Status: CP
Start: 2021-03-21 — End: 2022-03-21

## 2021-03-21 MED ORDER — BUMETANIDE 1 MG TABLET
ORAL_TABLET | Freq: Two times a day (BID) | ORAL | 11 refills | 30 days | Status: CP
Start: 2021-03-21 — End: 2022-03-21

## 2021-03-22 DIAGNOSIS — N042 Nephrotic syndrome with diffuse membranous glomerulonephritis: Principal | ICD-10-CM

## 2021-03-22 DIAGNOSIS — N184 Chronic kidney disease, stage 4 (severe): Principal | ICD-10-CM

## 2021-03-22 DIAGNOSIS — D631 Anemia in chronic kidney disease: Principal | ICD-10-CM

## 2021-03-22 DIAGNOSIS — N049 Nephrotic syndrome with unspecified morphologic changes: Principal | ICD-10-CM

## 2021-03-22 DIAGNOSIS — N185 Chronic kidney disease, stage 5: Principal | ICD-10-CM

## 2021-03-23 DIAGNOSIS — D849 Immunodeficiency, unspecified: Principal | ICD-10-CM

## 2021-03-23 DIAGNOSIS — Z940 Kidney transplant status: Principal | ICD-10-CM

## 2021-03-23 DIAGNOSIS — N186 End stage renal disease: Principal | ICD-10-CM

## 2021-03-23 MED ORDER — TACROLIMUS 1 MG CAPSULE, IMMEDIATE-RELEASE
ORAL_CAPSULE | Freq: Two times a day (BID) | ORAL | 3 refills | 90 days | Status: CP
Start: 2021-03-23 — End: 2022-03-23

## 2021-03-24 ENCOUNTER — Ambulatory Visit
Admit: 2021-03-24 | Discharge: 2021-04-03 | Disposition: A | Discharge status: Home / Self Care | Payer: MEDICARE | Admitting: Nephrology

## 2021-03-24 ENCOUNTER — Ambulatory Visit
Admit: 2021-03-24 | Discharge: 2021-04-03 | Disposition: A | Discharge status: Home / Self Care | Payer: MEDICARE | Attending: Nephrology | Admitting: Nephrology | Primary: Nephrology

## 2021-03-24 ENCOUNTER — Ambulatory Visit
Admit: 2021-03-21 | Discharge: 2021-03-22 | Disposition: A | Discharge status: Home / Self Care | Payer: MEDICARE | Admitting: Nephrology

## 2021-03-24 ENCOUNTER — Encounter
Admit: 2021-03-24 | Discharge: 2021-04-03 | Disposition: A | Discharge status: Home / Self Care | Payer: MEDICARE | Admitting: Nephrology

## 2021-03-24 ENCOUNTER — Ambulatory Visit
Admit: 2021-03-24 | Discharge: 2021-04-03 | Disposition: A | Discharge status: Home / Self Care | Payer: MEDICARE | Attending: Professional | Admitting: Nephrology | Primary: Professional

## 2021-03-26 MED ORDER — TACROLIMUS 1 MG CAPSULE, IMMEDIATE-RELEASE
ORAL_CAPSULE | Freq: Every evening | ORAL | 0 refills | 90 days
Start: 2021-03-26 — End: ?

## 2021-03-26 MED ORDER — VALACYCLOVIR 500 MG TABLET
ORAL_TABLET | Freq: Every evening | ORAL | 1 refills | 30 days
Start: 2021-03-26 — End: ?

## 2021-03-26 MED ORDER — DOXYCYCLINE HYCLATE 100 MG CAPSULE
ORAL_CAPSULE | Freq: Two times a day (BID) | ORAL | 0 refills | 4 days
Start: 2021-03-26 — End: 2021-03-30

## 2021-03-27 MED ORDER — VALACYCLOVIR 500 MG TABLET
ORAL_TABLET | Freq: Every evening | ORAL | 0 refills | 30.00000 days
Start: 2021-03-27 — End: 2021-04-26

## 2021-03-27 MED ORDER — FLUTICASONE PROPIONATE 50 MCG/ACTUATION NASAL SPRAY,SUSPENSION
Freq: Every day | NASAL | 0 refills | 0.00000 days
Start: 2021-03-27 — End: 2022-03-27

## 2021-03-27 MED ORDER — TACROLIMUS 1 MG CAPSULE, IMMEDIATE-RELEASE
ORAL_CAPSULE | Freq: Every day | ORAL | 0 refills | 0.00000 days | Status: CN
Start: 2021-03-27 — End: 2022-03-27

## 2021-03-28 MED ORDER — TACROLIMUS 1 MG CAPSULE, IMMEDIATE-RELEASE
ORAL_CAPSULE | Freq: Every day | ORAL | 11 refills | 30.00000 days | Status: CN
Start: 2021-03-28 — End: 2022-03-28

## 2021-03-28 MED ORDER — LEVOFLOXACIN 500 MG TABLET
ORAL_TABLET | ORAL | 0 refills | 8.00000 days
Start: 2021-03-28 — End: 2021-04-04

## 2021-03-30 DIAGNOSIS — N184 Chronic kidney disease, stage 4 (severe): Principal | ICD-10-CM

## 2021-03-30 DIAGNOSIS — N185 Chronic kidney disease, stage 5: Principal | ICD-10-CM

## 2021-03-30 DIAGNOSIS — D631 Anemia in chronic kidney disease: Principal | ICD-10-CM

## 2021-03-30 DIAGNOSIS — N042 Nephrotic syndrome with diffuse membranous glomerulonephritis: Principal | ICD-10-CM

## 2021-03-30 DIAGNOSIS — N049 Nephrotic syndrome with unspecified morphologic changes: Principal | ICD-10-CM

## 2021-04-03 MED ORDER — VALACYCLOVIR 500 MG TABLET
ORAL_TABLET | Freq: Every evening | ORAL | 0 refills | 30 days | Status: CP
Start: 2021-04-03 — End: ?

## 2021-04-03 MED ORDER — SEVELAMER CARBONATE 800 MG TABLET
ORAL_TABLET | Freq: Three times a day (TID) | ORAL | 0 refills | 30 days | Status: CP
Start: 2021-04-03 — End: 2021-05-03

## 2021-04-03 MED ORDER — LEVOFLOXACIN 500 MG TABLET
ORAL_TABLET | ORAL | 0 refills | 4 days | Status: CP
Start: 2021-04-03 — End: 2021-04-06

## 2021-04-03 MED ORDER — TACROLIMUS 1 MG CAPSULE, IMMEDIATE-RELEASE
ORAL_CAPSULE | Freq: Every evening | ORAL | 11 refills | 30 days | Status: CP
Start: 2021-04-03 — End: 2022-04-03

## 2021-04-04 MED ORDER — TACROLIMUS 1 MG CAPSULE, IMMEDIATE-RELEASE
ORAL_CAPSULE | Freq: Every day | ORAL | 11 refills | 30 days | Status: CP
Start: 2021-04-04 — End: 2022-04-04

## 2021-04-04 MED ORDER — BUMETANIDE 1 MG TABLET
ORAL_TABLET | Freq: Two times a day (BID) | ORAL | 0 refills | 30 days | Status: CP
Start: 2021-04-04 — End: 2021-05-04

## 2021-04-05 ENCOUNTER — Telehealth
Admit: 2021-04-05 | Discharge: 2021-04-06 | Payer: MEDICARE | Attending: Addiction (Substance Use Disorder) | Primary: Addiction (Substance Use Disorder)

## 2021-04-06 ENCOUNTER — Ambulatory Visit: Admit: 2021-04-06 | Discharge: 2021-04-07 | Payer: MEDICARE | Attending: Professional | Primary: Professional

## 2021-04-11 ENCOUNTER — Ambulatory Visit: Admit: 2021-04-11 | Discharge: 2021-04-12 | Payer: MEDICARE | Attending: Clinical | Primary: Clinical

## 2021-04-12 MED ORDER — POLYETHYLENE GLYCOL 3350 17 GRAM/DOSE ORAL POWDER
0 days
Start: 2021-04-12 — End: ?

## 2021-04-12 MED ORDER — ERGOCALCIFEROL (VITAMIN D2) 1,250 MCG (50,000 UNIT) CAPSULE
ORAL_CAPSULE | 3 refills | 0 days | Status: CP
Start: 2021-04-12 — End: ?

## 2021-04-16 ENCOUNTER — Ambulatory Visit
Admit: 2021-04-16 | Discharge: 2021-04-17 | Payer: MEDICARE | Attending: Student in an Organized Health Care Education/Training Program | Primary: Student in an Organized Health Care Education/Training Program

## 2021-04-18 ENCOUNTER — Ambulatory Visit: Admit: 2021-04-18 | Discharge: 2021-04-18 | Payer: MEDICARE

## 2021-04-18 ENCOUNTER — Ambulatory Visit: Admit: 2021-04-18 | Discharge: 2021-04-18 | Payer: MEDICARE | Attending: Clinical | Primary: Clinical

## 2021-04-18 DIAGNOSIS — Z01818 Encounter for other preprocedural examination: Principal | ICD-10-CM

## 2021-04-18 DIAGNOSIS — F1411 Cocaine abuse, in remission: Principal | ICD-10-CM

## 2021-04-18 DIAGNOSIS — N186 End stage renal disease: Principal | ICD-10-CM

## 2021-04-18 DIAGNOSIS — F1910 Other psychoactive substance abuse, uncomplicated: Principal | ICD-10-CM

## 2021-04-18 DIAGNOSIS — N185 Chronic kidney disease, stage 5: Principal | ICD-10-CM

## 2021-04-18 DIAGNOSIS — D631 Anemia in chronic kidney disease: Principal | ICD-10-CM

## 2021-04-18 DIAGNOSIS — I313 Pericardial effusion (noninflammatory): Principal | ICD-10-CM

## 2021-04-18 DIAGNOSIS — Z940 Kidney transplant status: Principal | ICD-10-CM

## 2021-04-23 ENCOUNTER — Ambulatory Visit: Admit: 2021-04-23 | Discharge: 2021-04-24 | Payer: MEDICARE

## 2021-04-25 ENCOUNTER — Ambulatory Visit: Admit: 2021-04-25 | Discharge: 2021-04-26 | Payer: MEDICARE | Attending: Clinical | Primary: Clinical

## 2021-04-27 ENCOUNTER — Ambulatory Visit: Admit: 2021-04-27 | Discharge: 2021-04-28 | Payer: MEDICARE | Attending: Professional | Primary: Professional

## 2021-04-28 ENCOUNTER — Ambulatory Visit: Admit: 2021-04-28 | Discharge: 2021-04-29 | Payer: MEDICARE

## 2021-04-30 ENCOUNTER — Telehealth
Admit: 2021-04-30 | Discharge: 2021-05-01 | Payer: MEDICARE | Attending: Addiction (Substance Use Disorder) | Primary: Addiction (Substance Use Disorder)

## 2021-05-02 ENCOUNTER — Ambulatory Visit: Admit: 2021-05-02 | Discharge: 2021-05-02 | Payer: MEDICARE | Attending: Clinical | Primary: Clinical

## 2021-05-02 ENCOUNTER — Ambulatory Visit: Admit: 2021-05-02 | Discharge: 2021-05-02 | Payer: MEDICARE | Attending: Nephrology | Primary: Nephrology

## 2021-05-02 ENCOUNTER — Ambulatory Visit: Admit: 2021-05-02 | Discharge: 2021-05-02 | Payer: MEDICARE

## 2021-05-02 DIAGNOSIS — N184 Chronic kidney disease, stage 4 (severe): Principal | ICD-10-CM

## 2021-05-02 DIAGNOSIS — N042 Nephrotic syndrome with diffuse membranous glomerulonephritis: Principal | ICD-10-CM

## 2021-05-02 DIAGNOSIS — D631 Anemia in chronic kidney disease: Principal | ICD-10-CM

## 2021-05-02 DIAGNOSIS — N186 End stage renal disease: Principal | ICD-10-CM

## 2021-05-02 DIAGNOSIS — Z79899 Other long term (current) drug therapy: Principal | ICD-10-CM

## 2021-05-02 DIAGNOSIS — N049 Nephrotic syndrome with unspecified morphologic changes: Principal | ICD-10-CM

## 2021-05-02 DIAGNOSIS — Z940 Kidney transplant status: Principal | ICD-10-CM

## 2021-05-02 DIAGNOSIS — F1910 Other psychoactive substance abuse, uncomplicated: Principal | ICD-10-CM

## 2021-05-02 DIAGNOSIS — N185 Chronic kidney disease, stage 5: Principal | ICD-10-CM

## 2021-05-02 DIAGNOSIS — F1411 Cocaine abuse, in remission: Principal | ICD-10-CM

## 2021-05-02 DIAGNOSIS — Z01818 Encounter for other preprocedural examination: Principal | ICD-10-CM

## 2021-05-11 MED ORDER — SEVELAMER CARBONATE 800 MG TABLET
ORAL_TABLET | 0 refills | 0 days
Start: 2021-05-11 — End: ?

## 2021-05-16 ENCOUNTER — Ambulatory Visit: Admit: 2021-05-16 | Discharge: 2021-05-17 | Payer: MEDICARE | Attending: Clinical | Primary: Clinical

## 2021-05-18 ENCOUNTER — Ambulatory Visit: Admit: 2021-05-18 | Discharge: 2021-05-19 | Payer: MEDICARE | Attending: Professional | Primary: Professional

## 2021-05-23 ENCOUNTER — Telehealth
Admit: 2021-05-23 | Discharge: 2021-05-24 | Payer: MEDICARE | Attending: Addiction (Substance Use Disorder) | Primary: Addiction (Substance Use Disorder)

## 2021-05-25 ENCOUNTER — Ambulatory Visit: Admit: 2021-05-25 | Discharge: 2021-05-26 | Payer: MEDICARE | Attending: Professional | Primary: Professional

## 2021-05-29 ENCOUNTER — Ambulatory Visit: Admit: 2021-05-29 | Discharge: 2021-05-30 | Payer: MEDICARE

## 2021-05-30 ENCOUNTER — Telehealth: Admit: 2021-05-30 | Discharge: 2021-05-31 | Payer: MEDICARE | Attending: Nephrology | Primary: Nephrology

## 2021-05-30 ENCOUNTER — Ambulatory Visit: Admit: 2021-05-30 | Discharge: 2021-05-31 | Payer: MEDICARE | Attending: Clinical | Primary: Clinical

## 2021-05-30 MED ORDER — PREDNISONE 5 MG TABLET
ORAL_TABLET | 3 refills | 0 days | Status: CP
Start: 2021-05-30 — End: ?

## 2021-06-03 DIAGNOSIS — Z940 Kidney transplant status: Principal | ICD-10-CM

## 2021-06-03 MED ORDER — AMLODIPINE 10 MG TABLET
ORAL_TABLET | 3 refills | 0 days
Start: 2021-06-03 — End: ?

## 2021-06-04 MED ORDER — OMEPRAZOLE 20 MG CAPSULE,DELAYED RELEASE
ORAL_CAPSULE | 3 refills | 0 days
Start: 2021-06-04 — End: ?

## 2021-06-08 ENCOUNTER — Ambulatory Visit: Admit: 2021-06-08 | Discharge: 2021-06-09 | Payer: MEDICARE | Attending: Professional | Primary: Professional

## 2021-06-13 ENCOUNTER — Ambulatory Visit: Admit: 2021-06-13 | Discharge: 2021-06-14 | Payer: MEDICARE | Attending: Professional | Primary: Professional

## 2021-06-15 ENCOUNTER — Ambulatory Visit: Admit: 2021-06-15 | Discharge: 2021-06-16 | Payer: MEDICARE | Attending: Professional | Primary: Professional

## 2021-06-15 ENCOUNTER — Ambulatory Visit: Admit: 2021-06-15 | Discharge: 2021-06-16 | Payer: MEDICARE

## 2021-06-15 DIAGNOSIS — F1411 Cocaine abuse, in remission: Principal | ICD-10-CM

## 2021-06-15 DIAGNOSIS — N042 Nephrotic syndrome with diffuse membranous glomerulonephritis: Principal | ICD-10-CM

## 2021-06-15 DIAGNOSIS — Z01818 Encounter for other preprocedural examination: Principal | ICD-10-CM

## 2021-06-15 DIAGNOSIS — F1910 Other psychoactive substance abuse, uncomplicated: Principal | ICD-10-CM

## 2021-06-15 DIAGNOSIS — N186 End stage renal disease: Principal | ICD-10-CM

## 2021-06-15 DIAGNOSIS — N184 Chronic kidney disease, stage 4 (severe): Principal | ICD-10-CM

## 2021-06-15 DIAGNOSIS — D631 Anemia in chronic kidney disease: Principal | ICD-10-CM

## 2021-06-15 DIAGNOSIS — N049 Nephrotic syndrome with unspecified morphologic changes: Principal | ICD-10-CM

## 2021-06-15 DIAGNOSIS — N185 Chronic kidney disease, stage 5: Principal | ICD-10-CM

## 2021-06-15 DIAGNOSIS — Z940 Kidney transplant status: Principal | ICD-10-CM

## 2021-06-15 MED ORDER — SEVELAMER CARBONATE 800 MG TABLET
ORAL_TABLET | Freq: Three times a day (TID) | ORAL | 11 refills | 30 days | Status: CP
Start: 2021-06-15 — End: 2022-06-15

## 2021-06-20 ENCOUNTER — Ambulatory Visit: Admit: 2021-06-20 | Discharge: 2021-06-21 | Payer: MEDICARE | Attending: Clinical | Primary: Clinical

## 2021-06-22 ENCOUNTER — Telehealth
Admit: 2021-06-22 | Discharge: 2021-06-23 | Payer: MEDICARE | Attending: Addiction (Substance Use Disorder) | Primary: Addiction (Substance Use Disorder)

## 2021-06-27 ENCOUNTER — Ambulatory Visit: Admit: 2021-06-27 | Discharge: 2021-06-28 | Payer: MEDICARE | Attending: Clinical | Primary: Clinical

## 2021-06-28 ENCOUNTER — Ambulatory Visit: Admit: 2021-06-28 | Discharge: 2021-06-29 | Payer: MEDICARE

## 2021-07-04 ENCOUNTER — Ambulatory Visit: Payer: Medicare Other

## 2021-07-06 ENCOUNTER — Telehealth: Admit: 2021-07-06 | Discharge: 2021-07-07 | Payer: MEDICARE | Attending: Nephrology | Primary: Nephrology

## 2021-07-06 DIAGNOSIS — D849 Immunodeficiency, unspecified: Principal | ICD-10-CM

## 2021-07-06 DIAGNOSIS — T8612 Kidney transplant failure: Principal | ICD-10-CM

## 2021-07-06 MED ORDER — PREDNISONE 1 MG TABLET
ORAL_TABLET | 3 refills | 0 days | Status: CP
Start: 2021-07-06 — End: ?

## 2021-07-07 DIAGNOSIS — Z01812 Encounter for preprocedural laboratory examination: Principal | ICD-10-CM

## 2021-07-07 DIAGNOSIS — Z20822 Encounter for preprocedure screening laboratory testing for COVID-19: Principal | ICD-10-CM

## 2021-07-16 ENCOUNTER — Ambulatory Visit: Admit: 2021-07-16 | Discharge: 2021-07-17 | Disposition: A | Discharge status: Home / Self Care | Payer: MEDICARE

## 2021-07-16 ENCOUNTER — Emergency Department: Admit: 2021-07-16 | Discharge: 2021-07-17 | Disposition: A | Discharge status: Home / Self Care | Payer: MEDICARE

## 2021-07-16 DIAGNOSIS — J029 Acute pharyngitis, unspecified: Principal | ICD-10-CM

## 2021-07-16 DIAGNOSIS — N186 End stage renal disease: Principal | ICD-10-CM

## 2021-07-16 DIAGNOSIS — U071 COVID: Principal | ICD-10-CM

## 2021-07-18 ENCOUNTER — Ambulatory Visit: Admit: 2021-07-18 | Discharge: 2021-07-19 | Payer: MEDICARE | Attending: Clinical | Primary: Clinical

## 2021-07-19 DIAGNOSIS — N186 End stage renal disease: Principal | ICD-10-CM

## 2021-07-20 ENCOUNTER — Ambulatory Visit: Admit: 2021-07-20 | Discharge: 2021-07-21 | Payer: MEDICARE | Attending: Professional | Primary: Professional

## 2021-07-23 ENCOUNTER — Telehealth: Payer: Medicare Other

## 2021-07-23 ENCOUNTER — Ambulatory Visit: Payer: Medicare Other

## 2021-07-23 NOTE — Telephone Encounter (Signed)
This nurse attempted to call patient three times for telephonic AWV. Called at 1425, 1430, and 1440. Number rings once then has busy tone. Unable to leave a message.

## 2021-07-25 ENCOUNTER — Telehealth
Admit: 2021-07-25 | Discharge: 2021-07-26 | Payer: MEDICARE | Attending: Addiction (Substance Use Disorder) | Primary: Addiction (Substance Use Disorder)

## 2021-07-25 ENCOUNTER — Ambulatory Visit: Admit: 2021-07-25 | Discharge: 2021-07-26 | Payer: MEDICARE | Attending: Clinical | Primary: Clinical

## 2021-07-27 ENCOUNTER — Ambulatory Visit: Admit: 2021-07-27 | Discharge: 2021-07-28 | Payer: MEDICARE | Attending: Professional | Primary: Professional

## 2021-08-03 ENCOUNTER — Ambulatory Visit: Admit: 2021-08-03 | Discharge: 2021-08-04 | Payer: MEDICARE | Attending: Professional | Primary: Professional

## 2021-08-08 ENCOUNTER — Ambulatory Visit: Admit: 2021-08-08 | Discharge: 2021-08-09 | Payer: MEDICARE | Attending: Clinical | Primary: Clinical

## 2021-08-13 ENCOUNTER — Ambulatory Visit: Admit: 2021-08-13 | Discharge: 2021-08-14 | Payer: MEDICARE

## 2021-08-13 DIAGNOSIS — H04123 Dry eye syndrome of bilateral lacrimal glands: Principal | ICD-10-CM

## 2021-08-13 DIAGNOSIS — Z794 Long term (current) use of insulin: Principal | ICD-10-CM

## 2021-08-13 DIAGNOSIS — H40003 Preglaucoma, unspecified, bilateral: Principal | ICD-10-CM

## 2021-08-13 DIAGNOSIS — E1169 Type 2 diabetes mellitus with other specified complication: Principal | ICD-10-CM

## 2021-08-24 ENCOUNTER — Ambulatory Visit
Admit: 2021-08-24 | Discharge: 2021-08-25 | Disposition: A | Discharge status: Home / Self Care | Payer: MEDICARE | Attending: Emergency Medicine

## 2021-08-25 MED ORDER — HYDROXYZINE HCL 25 MG TABLET
ORAL_TABLET | Freq: Two times a day (BID) | ORAL | 0 refills | 15 days | Status: CP | PRN
Start: 2021-08-25 — End: ?

## 2021-08-27 ENCOUNTER — Ambulatory Visit: Admit: 2021-08-27 | Discharge: 2021-09-07 | Disposition: A | Discharge status: Home / Self Care | Payer: MEDICARE

## 2021-08-27 ENCOUNTER — Ambulatory Visit: Admit: 2021-08-27 | Discharge: 2021-09-07 | Payer: MEDICARE

## 2021-08-27 ENCOUNTER — Ambulatory Visit: Admit: 2021-08-27 | Payer: MEDICARE

## 2021-08-30 DIAGNOSIS — N049 Nephrotic syndrome with unspecified morphologic changes: Principal | ICD-10-CM

## 2021-08-30 DIAGNOSIS — D631 Anemia in chronic kidney disease: Principal | ICD-10-CM

## 2021-08-30 DIAGNOSIS — N185 Chronic kidney disease, stage 5: Principal | ICD-10-CM

## 2021-08-30 DIAGNOSIS — N184 Chronic kidney disease, stage 4 (severe): Principal | ICD-10-CM

## 2021-08-30 DIAGNOSIS — N042 Nephrotic syndrome with diffuse membranous glomerulonephritis: Principal | ICD-10-CM

## 2021-09-06 MED ORDER — SEVELAMER CARBONATE 800 MG TABLET
ORAL_TABLET | Freq: Three times a day (TID) | ORAL | 11 refills | 30 days | Status: CP
Start: 2021-09-06 — End: 2022-09-06

## 2021-09-06 MED ORDER — ATORVASTATIN 10 MG TABLET
ORAL_TABLET | Freq: Every evening | ORAL | 0 refills | 30 days | Status: CN
Start: 2021-09-06 — End: 2021-10-06

## 2021-09-06 MED ORDER — ASPIRIN 81 MG CHEWABLE TABLET
ORAL_TABLET | Freq: Every day | ORAL | 0 refills | 36.00000 days | Status: CP
Start: 2021-09-06 — End: 2021-10-12
  Filled 2021-09-06: qty 36, 36d supply, fill #0

## 2021-09-06 MED ORDER — CITALOPRAM 20 MG TABLET
ORAL_TABLET | Freq: Every day | ORAL | 0 refills | 30 days | Status: CP
Start: 2021-09-06 — End: 2021-10-06
  Filled 2021-09-06: qty 30, 30d supply, fill #0

## 2021-09-06 MED ORDER — CARVEDILOL 25 MG TABLET
ORAL_TABLET | Freq: Two times a day (BID) | ORAL | 0 refills | 30.00000 days | Status: CP
Start: 2021-09-06 — End: 2021-10-06
  Filled 2021-09-06: qty 60, 30d supply, fill #0

## 2021-09-06 MED ORDER — HYDROXYZINE HCL 25 MG TABLET
ORAL_TABLET | Freq: Every day | ORAL | 0 refills | 15.00000 days | Status: CP | PRN
Start: 2021-09-06 — End: 2021-10-06
  Filled 2021-09-06: qty 30, 30d supply, fill #0

## 2021-09-06 MED ORDER — ROSUVASTATIN 5 MG TABLET
ORAL_TABLET | Freq: Every day | ORAL | 0 refills | 30 days | Status: CP
Start: 2021-09-06 — End: 2021-10-06
  Filled 2021-09-06: qty 30, 30d supply, fill #0

## 2021-09-06 MED ORDER — TRAZODONE 100 MG TABLET
ORAL_TABLET | Freq: Every evening | ORAL | 0 refills | 30.00000 days | Status: CP
Start: 2021-09-06 — End: 2021-10-06
  Filled 2021-09-06: qty 30, 30d supply, fill #0

## 2021-09-06 MED ORDER — GABAPENTIN 100 MG CAPSULE
ORAL_CAPSULE | Freq: Three times a day (TID) | ORAL | 0 refills | 30 days | Status: CP
Start: 2021-09-06 — End: 2021-10-06
  Filled 2021-09-06: qty 90, 30d supply, fill #0

## 2021-09-10 MED ORDER — CARVEDILOL 25 MG TABLET
ORAL_TABLET | 0 refills | 0 days
Start: 2021-09-10 — End: ?

## 2021-09-15 DIAGNOSIS — F419 Anxiety disorder, unspecified: Principal | ICD-10-CM

## 2021-09-15 MED ORDER — CITALOPRAM 10 MG TABLET
ORAL_TABLET | 3 refills | 0 days
Start: 2021-09-15 — End: ?

## 2021-09-17 MED ORDER — CLONIDINE HCL 0.3 MG TABLET
ORAL_TABLET | 11 refills | 0 days
Start: 2021-09-17 — End: ?

## 2021-09-18 MED ORDER — CARVEDILOL 25 MG TABLET
ORAL_TABLET | 0 refills | 0 days
Start: 2021-09-18 — End: ?

## 2021-10-01 ENCOUNTER — Telehealth
Admit: 2021-10-01 | Discharge: 2021-10-02 | Payer: MEDICARE | Attending: Addiction (Substance Use Disorder) | Primary: Addiction (Substance Use Disorder)

## 2021-10-01 DIAGNOSIS — Z01818 Encounter for other preprocedural examination: Principal | ICD-10-CM

## 2021-10-01 DIAGNOSIS — N186 End stage renal disease: Principal | ICD-10-CM

## 2021-10-01 DIAGNOSIS — F1911 Other psychoactive substance abuse, in remission: Principal | ICD-10-CM

## 2021-10-03 ENCOUNTER — Ambulatory Visit: Admit: 2021-10-03 | Discharge: 2021-10-04 | Payer: MEDICARE

## 2021-10-03 ENCOUNTER — Ambulatory Visit: Admit: 2021-10-03 | Discharge: 2021-10-04 | Payer: MEDICARE | Attending: Clinical | Primary: Clinical

## 2021-10-05 ENCOUNTER — Ambulatory Visit: Admit: 2021-10-05 | Discharge: 2021-10-06 | Payer: MEDICARE | Attending: Professional | Primary: Professional

## 2021-10-10 ENCOUNTER — Ambulatory Visit: Admit: 2021-10-10 | Discharge: 2021-10-11 | Payer: MEDICARE | Attending: Clinical | Primary: Clinical

## 2021-10-12 ENCOUNTER — Ambulatory Visit: Admit: 2021-10-12 | Discharge: 2021-10-13 | Payer: MEDICARE | Attending: Professional | Primary: Professional

## 2021-10-17 ENCOUNTER — Ambulatory Visit: Admit: 2021-10-17 | Discharge: 2021-10-18 | Payer: MEDICARE | Attending: Clinical | Primary: Clinical

## 2021-10-19 ENCOUNTER — Ambulatory Visit
Admit: 2021-10-19 | Discharge: 2021-10-20 | Payer: MEDICARE | Attending: Addiction (Substance Use Disorder) | Primary: Addiction (Substance Use Disorder)

## 2021-10-22 ENCOUNTER — Telehealth
Admit: 2021-10-22 | Discharge: 2021-10-23 | Payer: MEDICARE | Attending: Addiction (Substance Use Disorder) | Primary: Addiction (Substance Use Disorder)

## 2021-10-24 ENCOUNTER — Ambulatory Visit: Admit: 2021-10-24 | Discharge: 2021-10-25 | Payer: MEDICARE | Attending: Clinical | Primary: Clinical

## 2021-10-30 DIAGNOSIS — F1994 Other psychoactive substance use, unspecified with psychoactive substance-induced mood disorder: Principal | ICD-10-CM

## 2021-10-30 MED ORDER — TRAZODONE 100 MG TABLET
ORAL_TABLET | Freq: Every evening | ORAL | 0 refills | 90 days | Status: CP
Start: 2021-10-30 — End: 2022-01-28

## 2021-10-30 MED ORDER — HYDROXYZINE HCL 25 MG TABLET
ORAL_TABLET | Freq: Three times a day (TID) | ORAL | 3 refills | 30 days | Status: CP | PRN
Start: 2021-10-30 — End: ?

## 2021-11-01 ENCOUNTER — Ambulatory Visit: Admit: 2021-11-01 | Discharge: 2021-11-07 | Payer: MEDICARE

## 2021-11-01 ENCOUNTER — Ambulatory Visit
Admit: 2021-11-01 | Discharge: 2021-11-07 | Disposition: A | Discharge status: Home / Self Care | Payer: MEDICARE | Source: Ambulatory Visit

## 2021-11-01 ENCOUNTER — Encounter: Admit: 2021-11-01 | Discharge: 2021-11-07 | Payer: MEDICARE

## 2021-11-07 MED ORDER — DOXYCYCLINE HYCLATE 100 MG CAPSULE
ORAL_CAPSULE | Freq: Two times a day (BID) | ORAL | 0 refills | 1 days | Status: CP
Start: 2021-11-07 — End: 2021-11-08
  Filled 2021-11-07: qty 1, 1d supply, fill #0

## 2021-11-07 MED ORDER — SEVELAMER CARBONATE 800 MG TABLET
ORAL_TABLET | Freq: Three times a day (TID) | ORAL | 1 refills | 30 days | Status: CP
Start: 2021-11-07 — End: ?

## 2021-11-09 ENCOUNTER — Telehealth
Admit: 2021-11-09 | Discharge: 2021-11-10 | Payer: MEDICARE | Attending: Addiction (Substance Use Disorder) | Primary: Addiction (Substance Use Disorder)

## 2021-11-09 ENCOUNTER — Ambulatory Visit: Admit: 2021-11-09 | Discharge: 2021-11-10 | Payer: MEDICARE

## 2021-11-14 ENCOUNTER — Ambulatory Visit: Admit: 2021-11-14 | Discharge: 2021-11-15 | Payer: MEDICARE | Attending: Clinical | Primary: Clinical

## 2021-11-24 ENCOUNTER — Other Ambulatory Visit
Admission: RE | Admit: 2021-11-24 | Discharge: 2021-11-24 | Disposition: A | Discharge status: Home / Self Care | Payer: Medicare Other | Source: Ambulatory Visit | Attending: *Deleted | Admitting: *Deleted

## 2021-11-24 LAB — POTASSIUM: Potassium: 5.8 mmol/L — ABNORMAL HIGH (ref 3.5–5.1)

## 2021-11-28 ENCOUNTER — Ambulatory Visit: Admit: 2021-11-28 | Discharge: 2021-11-29 | Payer: MEDICARE | Attending: Clinical | Primary: Clinical

## 2021-11-29 DIAGNOSIS — N049 Nephrotic syndrome with unspecified morphologic changes: Principal | ICD-10-CM

## 2021-11-29 DIAGNOSIS — N184 Chronic kidney disease, stage 4 (severe): Principal | ICD-10-CM

## 2021-11-29 DIAGNOSIS — N042 Nephrotic syndrome with diffuse membranous glomerulonephritis: Principal | ICD-10-CM

## 2021-11-29 DIAGNOSIS — N185 Chronic kidney disease, stage 5: Principal | ICD-10-CM

## 2021-11-29 DIAGNOSIS — D631 Anemia in chronic kidney disease: Principal | ICD-10-CM

## 2021-11-30 ENCOUNTER — Ambulatory Visit
Admit: 2021-11-30 | Discharge: 2021-12-01 | Payer: MEDICARE | Attending: Student in an Organized Health Care Education/Training Program | Primary: Student in an Organized Health Care Education/Training Program

## 2021-11-30 ENCOUNTER — Telehealth
Admit: 2021-11-30 | Discharge: 2021-12-01 | Payer: MEDICARE | Attending: Addiction (Substance Use Disorder) | Primary: Addiction (Substance Use Disorder)

## 2021-11-30 DIAGNOSIS — N186 End stage renal disease: Principal | ICD-10-CM

## 2021-12-12 ENCOUNTER — Ambulatory Visit: Admit: 2021-12-12 | Discharge: 2021-12-13 | Payer: MEDICARE | Attending: Clinical | Primary: Clinical

## 2021-12-14 ENCOUNTER — Ambulatory Visit: Admit: 2021-12-14 | Discharge: 2021-12-15 | Payer: MEDICARE

## 2021-12-19 ENCOUNTER — Ambulatory Visit: Admit: 2021-12-19 | Discharge: 2021-12-20 | Payer: MEDICARE | Attending: Clinical | Primary: Clinical

## 2022-01-01 ENCOUNTER — Ambulatory Visit
Admit: 2022-01-01 | Payer: MEDICARE | Attending: Student in an Organized Health Care Education/Training Program | Primary: Student in an Organized Health Care Education/Training Program

## 2022-01-02 ENCOUNTER — Ambulatory Visit: Admit: 2022-01-02 | Discharge: 2022-01-03 | Payer: MEDICARE

## 2022-01-02 DIAGNOSIS — Z01818 Encounter for other preprocedural examination: Principal | ICD-10-CM

## 2022-01-02 DIAGNOSIS — I10 Essential (primary) hypertension: Principal | ICD-10-CM

## 2022-01-02 DIAGNOSIS — N186 End stage renal disease: Principal | ICD-10-CM

## 2022-01-02 DIAGNOSIS — G4733 Obstructive sleep apnea (adult) (pediatric): Principal | ICD-10-CM

## 2022-01-02 DIAGNOSIS — J1000 Influenza due to other identified influenza virus with unspecified type of pneumonia: Principal | ICD-10-CM

## 2022-01-02 DIAGNOSIS — I639 Cerebral infarction, unspecified: Principal | ICD-10-CM

## 2022-01-04 ENCOUNTER — Telehealth
Admit: 2022-01-04 | Discharge: 2022-01-05 | Payer: MEDICARE | Attending: Addiction (Substance Use Disorder) | Primary: Addiction (Substance Use Disorder)

## 2022-01-04 DIAGNOSIS — F1420 Cocaine dependence, uncomplicated: Principal | ICD-10-CM

## 2022-01-04 DIAGNOSIS — F1220 Cannabis dependence, uncomplicated: Principal | ICD-10-CM

## 2022-01-04 DIAGNOSIS — F1020 Alcohol dependence, uncomplicated: Principal | ICD-10-CM

## 2022-02-27 DIAGNOSIS — N049 Nephrotic syndrome with unspecified morphologic changes: Principal | ICD-10-CM

## 2022-02-27 DIAGNOSIS — D631 Anemia in chronic kidney disease: Principal | ICD-10-CM

## 2022-02-27 DIAGNOSIS — N184 Chronic kidney disease, stage 4 (severe): Principal | ICD-10-CM

## 2022-02-27 DIAGNOSIS — N185 Chronic kidney disease, stage 5: Principal | ICD-10-CM

## 2022-02-27 DIAGNOSIS — N042 Nephrotic syndrome with diffuse membranous glomerulonephritis: Principal | ICD-10-CM

## 2022-03-11 ENCOUNTER — Telehealth
Admit: 2022-03-11 | Discharge: 2022-03-12 | Payer: MEDICARE | Attending: Addiction (Substance Use Disorder) | Primary: Addiction (Substance Use Disorder)

## 2022-03-11 DIAGNOSIS — F1020 Alcohol dependence, uncomplicated: Principal | ICD-10-CM

## 2022-03-11 DIAGNOSIS — F1220 Cannabis dependence, uncomplicated: Principal | ICD-10-CM

## 2022-03-11 DIAGNOSIS — F1420 Cocaine dependence, uncomplicated: Principal | ICD-10-CM

## 2022-03-13 ENCOUNTER — Telehealth: Admit: 2022-03-13 | Discharge: 2022-03-14 | Payer: MEDICARE | Attending: Clinical | Primary: Clinical

## 2022-03-13 ENCOUNTER — Ambulatory Visit: Admit: 2022-03-13 | Discharge: 2022-03-14 | Payer: MEDICARE

## 2022-03-15 ENCOUNTER — Ambulatory Visit
Admit: 2022-03-15 | Payer: MEDICARE | Attending: Addiction (Substance Use Disorder) | Primary: Addiction (Substance Use Disorder)

## 2022-03-20 ENCOUNTER — Ambulatory Visit: Admit: 2022-03-20 | Discharge: 2022-03-21 | Payer: MEDICARE | Attending: Clinical | Primary: Clinical

## 2022-03-22 ENCOUNTER — Ambulatory Visit: Admit: 2022-03-22 | Discharge: 2022-03-23 | Payer: MEDICARE | Attending: Clinical | Primary: Clinical

## 2022-03-27 ENCOUNTER — Telehealth
Admit: 2022-03-27 | Discharge: 2022-03-28 | Payer: MEDICARE | Attending: Addiction (Substance Use Disorder) | Primary: Addiction (Substance Use Disorder)

## 2022-03-29 ENCOUNTER — Ambulatory Visit
Admit: 2022-03-29 | Discharge: 2022-03-30 | Payer: MEDICARE | Attending: Addiction (Substance Use Disorder) | Primary: Addiction (Substance Use Disorder)

## 2022-04-01 ENCOUNTER — Telehealth
Admit: 2022-04-01 | Discharge: 2022-04-02 | Payer: MEDICARE | Attending: Addiction (Substance Use Disorder) | Primary: Addiction (Substance Use Disorder)

## 2022-04-01 DIAGNOSIS — F1020 Alcohol dependence, uncomplicated: Principal | ICD-10-CM

## 2022-04-01 DIAGNOSIS — F1220 Cannabis dependence, uncomplicated: Principal | ICD-10-CM

## 2022-04-01 DIAGNOSIS — F1420 Cocaine dependence, uncomplicated: Principal | ICD-10-CM

## 2022-04-03 ENCOUNTER — Telehealth: Admit: 2022-04-03 | Discharge: 2022-04-04 | Payer: MEDICARE | Attending: Clinical | Primary: Clinical

## 2022-04-05 ENCOUNTER — Ambulatory Visit: Admit: 2022-04-05 | Discharge: 2022-04-06 | Payer: MEDICARE | Attending: Clinical | Primary: Clinical

## 2022-04-10 ENCOUNTER — Ambulatory Visit: Admit: 2022-04-10 | Payer: MEDICARE | Attending: Clinical | Primary: Clinical

## 2022-04-12 ENCOUNTER — Telehealth: Admit: 2022-04-12 | Discharge: 2022-04-13 | Payer: MEDICARE | Attending: Clinical | Primary: Clinical

## 2022-04-15 ENCOUNTER — Encounter
Admit: 2022-04-15 | Discharge: 2022-04-15 | Payer: MEDICARE | Attending: Student in an Organized Health Care Education/Training Program | Primary: Student in an Organized Health Care Education/Training Program

## 2022-04-15 ENCOUNTER — Ambulatory Visit: Admit: 2022-04-15 | Discharge: 2022-04-15 | Payer: MEDICARE

## 2022-04-17 ENCOUNTER — Telehealth: Admit: 2022-04-17 | Discharge: 2022-04-18 | Payer: MEDICARE | Attending: Clinical | Primary: Clinical

## 2022-04-19 ENCOUNTER — Ambulatory Visit: Admit: 2022-04-19 | Discharge: 2022-04-20 | Payer: MEDICARE | Attending: Clinical | Primary: Clinical

## 2022-04-22 ENCOUNTER — Telehealth
Admit: 2022-04-22 | Discharge: 2022-04-23 | Payer: MEDICARE | Attending: Addiction (Substance Use Disorder) | Primary: Addiction (Substance Use Disorder)

## 2022-04-22 DIAGNOSIS — F1020 Alcohol dependence, uncomplicated: Principal | ICD-10-CM

## 2022-04-22 DIAGNOSIS — F1220 Cannabis dependence, uncomplicated: Principal | ICD-10-CM

## 2022-04-22 DIAGNOSIS — F1420 Cocaine dependence, uncomplicated: Principal | ICD-10-CM

## 2022-04-24 ENCOUNTER — Telehealth: Admit: 2022-04-24 | Discharge: 2022-04-25 | Payer: MEDICARE | Attending: Clinical | Primary: Clinical

## 2022-05-03 ENCOUNTER — Ambulatory Visit: Admit: 2022-05-03 | Discharge: 2022-05-04 | Payer: MEDICARE | Attending: Clinical | Primary: Clinical

## 2022-05-03 ENCOUNTER — Ambulatory Visit
Admit: 2022-05-03 | Discharge: 2022-05-04 | Payer: MEDICARE | Attending: Student in an Organized Health Care Education/Training Program | Primary: Student in an Organized Health Care Education/Training Program

## 2022-05-03 DIAGNOSIS — N186 End stage renal disease: Principal | ICD-10-CM

## 2022-05-16 ENCOUNTER — Other Ambulatory Visit: Payer: Self-pay

## 2022-05-16 ENCOUNTER — Emergency Department
Admission: EM | Admit: 2022-05-16 | Discharge: 2022-05-16 | Disposition: A | Discharge status: Home / Self Care | Payer: Medicare Other | Attending: Emergency Medicine | Admitting: Emergency Medicine

## 2022-05-16 ENCOUNTER — Encounter: Payer: Self-pay | Admitting: Emergency Medicine

## 2022-05-16 DIAGNOSIS — Y828 Other medical devices associated with adverse incidents: Secondary | ICD-10-CM | POA: Insufficient documentation

## 2022-05-16 DIAGNOSIS — T8242XA Displacement of vascular dialysis catheter, initial encounter: Secondary | ICD-10-CM | POA: Insufficient documentation

## 2022-05-16 NOTE — ED Provider Notes (Signed)
[facility] Provider Note  Patient Contact: 8:00 PM (approximate)   History   medical device problem   HPI  Eric Frey is a 41 y.o. male who presents the emergency department complaining of a accidental removal of his chest wall dialysis catheter.  Patient states that he has a right chest wall dialysis catheter that was placed while his fistula was maturing.  Patient states that surgery for his fistula was 4 weeks ago.  He still has several weeks before he can use his fistula.  Patient accidentally had his catheter traumatically removed.  Bleeding immediately but this was controlled with direct pressure.  There is been no residual bleeding.  No other complaints     Physical Exam   Triage Vital Signs: ED Triage Vitals  Enc Vitals Group     BP 05/16/22 1812 130/88     Pulse Rate 05/16/22 1812 (!) 105     Resp 05/16/22 1812 16     Temp 05/16/22 1812 98.7 F (37.1 C)     Temp Source 05/16/22 1812 Oral     SpO2 05/16/22 1812 96 %     Weight 05/16/22 1811 253 lb 8.5 oz (115 kg)     Height 05/16/22 1811 5\' 11"  (1.803 m)     Head Circumference --      Peak Flow --      Pain Score 05/16/22 1811 0     Pain Loc --      Pain Edu? --      Excl. in [place]? --     Most recent vital signs: Vitals:   05/16/22 1812 05/16/22 1851  BP: 130/88 109/72  Pulse: (!) 105 (!) 102  Resp: 16 16  Temp: 98.7 F (37.1 C)   SpO2: 96% 91%     General: Alert and in no acute distress.  Cardiovascular:  Good peripheral perfusion.  Patient with visible area to the right chest wall where his previous dialysis catheter was.  There is no active bleeding.  No hematoma, tenderness, ecchymosis identified.  Respiratory: Normal respiratory effort without tachypnea or retractions. Lungs CTAB.  Musculoskeletal: Full range of motion to all extremities.  Neurologic:  No gross focal neurologic deficits are appreciated.  Skin:   No rash noted Other:   ED Results / Procedures /  Treatments   Labs (all labs ordered are listed, but only abnormal results are displayed) Labs Reviewed - No data to display   EKG     RADIOLOGY   No results found.  PROCEDURES:  Critical Care performed: No  Procedures   MEDICATIONS ORDERED IN ED: Medications - No data to display   IMPRESSION / MDM / Painter / ED COURSE  I reviewed the triage vital signs and the nursing notes.                              Differential diagnosis includes, but is not limited to, dialysis catheter complication, hematoma, hemorrhage   Patient's diagnosis is consistent with accidental removal of dialysis catheter.  Patient presents after his dialysis catheter in his right chest wall became snagged traumatically removed.  Direct pressure was able to control bleeding.  Patient has no evidence of hematoma or other concerning signs of ongoing hemorrhage.  I reached out to vascular surgeon, Dr. Lucky Cowboy as the patient does have a fistula placed but it is not mature enough for access.  Patient will be able  to see Dr. Lucky Cowboy as an outpatient tomorrow to have a catheter placed.  Return precautions discussed with the patient.   Patient is given ED precautions to return to the ED for any worsening or new symptoms.        FINAL CLINICAL IMPRESSION(S) / ED DIAGNOSES   Final diagnoses:  Displacement of vascular dialysis catheter, initial encounter [facility])     Rx / DC Orders   ED Discharge Orders     None        Note:  This document was prepared using Dragon voice recognition software and may include unintentional dictation errors.   Brynda Peon 05/16/22 2005    Vanessa , MD 05/17/22 1136

## 2022-05-16 NOTE — ED Triage Notes (Signed)
STates right chest dialysis catheter was sweeping and moping when he noticed blood every where and catheter was on the ground.  Open wound noted to right chest.  Bleeding controlled.  Dialyzed today, no problem with treatment.

## 2022-05-16 NOTE — ED Notes (Signed)
Pt discharge information reviewed. Pt understands need for follow up care and when to return if symptoms worsen. All questions answered. Pt is alert and oriented with even and regular respirations. Pt is seen ambulating out of department with string steady gait with family.  °

## 2022-05-16 NOTE — ED Notes (Signed)
Pt to ED with partner, pt has bandage to R upper chest where dialysis catheter had been and had fallen out today, dried blood on shirt, no new bleeding at this time, attached cardiac monitor and obtained VS. See triage note.

## 2022-05-17 ENCOUNTER — Ambulatory Visit: Payer: Medicare Other | Admitting: Nurse Practitioner

## 2022-05-17 ENCOUNTER — Other Ambulatory Visit: Payer: Self-pay

## 2022-05-17 ENCOUNTER — Encounter: Payer: Self-pay | Admitting: Vascular Surgery

## 2022-05-17 ENCOUNTER — Ambulatory Visit
Admission: RE | Admit: 2022-05-17 | Discharge: 2022-05-17 | Disposition: A | Discharge status: Home / Self Care | Payer: Medicare Other | Source: Ambulatory Visit | Attending: Vascular Surgery | Admitting: Vascular Surgery

## 2022-05-17 ENCOUNTER — Encounter
Admission: RE | Disposition: A | Discharge status: Home / Self Care | Payer: Self-pay | Source: Ambulatory Visit | Attending: Vascular Surgery

## 2022-05-17 ENCOUNTER — Telehealth (INDEPENDENT_AMBULATORY_CARE_PROVIDER_SITE_OTHER): Payer: Self-pay

## 2022-05-17 DIAGNOSIS — Y841 Kidney dialysis as the cause of abnormal reaction of the patient, or of later complication, without mention of misadventure at the time of the procedure: Secondary | ICD-10-CM | POA: Diagnosis not present

## 2022-05-17 DIAGNOSIS — N186 End stage renal disease: Secondary | ICD-10-CM

## 2022-05-17 DIAGNOSIS — Z992 Dependence on renal dialysis: Secondary | ICD-10-CM | POA: Insufficient documentation

## 2022-05-17 DIAGNOSIS — T82898A Other specified complication of vascular prosthetic devices, implants and grafts, initial encounter: Secondary | ICD-10-CM | POA: Diagnosis present

## 2022-05-17 HISTORY — PX: DIALYSIS/PERMA CATHETER INSERTION: CATH118288

## 2022-05-17 LAB — GLUCOSE, CAPILLARY: Glucose-Capillary: 78 mg/dL (ref 70–99)

## 2022-05-17 SURGERY — DIALYSIS/PERMA CATHETER INSERTION
Anesthesia: Moderate Sedation

## 2022-05-17 MED ORDER — SODIUM CHLORIDE 0.9 % IV SOLN: INTRAVENOUS | Status: DC

## 2022-05-17 MED ORDER — CEFAZOLIN SODIUM-DEXTROSE 2-4 GM/100ML-% IV SOLN: 2.0000 g | INTRAVENOUS | Status: DC

## 2022-05-17 MED ORDER — HEPARIN SODIUM (PORCINE) 10000 UNIT/ML IJ SOLN
INTRAMUSCULAR | Status: AC
  Filled 2022-05-17: qty 1

## 2022-05-17 MED ORDER — FENTANYL CITRATE (PF) 100 MCG/2ML IJ SOLN
INTRAMUSCULAR | Status: DC | PRN
  Administered 2022-05-17 (×2): 50 ug via INTRAVENOUS

## 2022-05-17 MED ORDER — FENTANYL CITRATE (PF) 100 MCG/2ML IJ SOLN
INTRAMUSCULAR | Status: DC
Start: 2022-05-17 — End: 2022-05-17
  Filled 2022-05-17: qty 2

## 2022-05-17 MED ORDER — MIDAZOLAM HCL 2 MG/2ML IJ SOLN
INTRAMUSCULAR | Status: AC
  Filled 2022-05-17: qty 2

## 2022-05-17 MED ORDER — DIPHENHYDRAMINE HCL 50 MG/ML IJ SOLN: 50.0000 mg | Freq: Once | INTRAMUSCULAR | Status: DC | PRN

## 2022-05-17 MED ORDER — MIDAZOLAM HCL 2 MG/2ML IJ SOLN
INTRAMUSCULAR | Status: DC | PRN
Start: 2022-05-17 — End: 2022-05-17
  Administered 2022-05-17: 2 mg via INTRAVENOUS

## 2022-05-17 MED ORDER — MIDAZOLAM HCL 2 MG/ML PO SYRP: 8.0000 mg | ORAL_SOLUTION | Freq: Once | ORAL | Status: DC | PRN

## 2022-05-17 MED ORDER — METHYLPREDNISOLONE SODIUM SUCC 125 MG IJ SOLR: 125.0000 mg | Freq: Once | INTRAMUSCULAR | Status: DC | PRN

## 2022-05-17 MED ORDER — FAMOTIDINE 20 MG PO TABS: 40.0000 mg | ORAL_TABLET | Freq: Once | ORAL | Status: DC | PRN

## 2022-05-17 MED ORDER — HYDROMORPHONE HCL 1 MG/ML IJ SOLN: 1.0000 mg | Freq: Once | INTRAMUSCULAR | Status: DC | PRN

## 2022-05-17 MED ORDER — CEFAZOLIN SODIUM-DEXTROSE 1-4 GM/50ML-% IV SOLN
1.0000 g | INTRAVENOUS | Status: AC
  Administered 2022-05-17: 1 g via INTRAVENOUS

## 2022-05-17 MED ORDER — ONDANSETRON HCL 4 MG/2ML IJ SOLN: 4.0000 mg | Freq: Four times a day (QID) | INTRAMUSCULAR | Status: DC | PRN

## 2022-05-17 SURGICAL SUPPLY — 8 items
BIOPATCH RED 1 DISK 7.0 (GAUZE/BANDAGES/DRESSINGS) ×1 IMPLANT
CATH CANNON HEMO 15FR 23CM (HEMODIALYSIS SUPPLIES) ×1 IMPLANT
COVER PROBE U/S 5X48 (MISCELLANEOUS) ×1 IMPLANT
DERMABOND ADVANCED (GAUZE/BANDAGES/DRESSINGS) ×1
DERMABOND ADVANCED .7 DNX12 (GAUZE/BANDAGES/DRESSINGS) IMPLANT
PACK ANGIOGRAPHY (CUSTOM PROCEDURE TRAY) ×1 IMPLANT
SUT MNCRL AB 4-0 PS2 18 (SUTURE) ×1 IMPLANT
SUT PROLENE 0 CT 1 30 (SUTURE) ×1 IMPLANT

## 2022-05-17 NOTE — Op Note (Signed)
OPERATIVE NOTE    PRE-OPERATIVE DIAGNOSIS: 1. ESRD  POST-OPERATIVE DIAGNOSIS: same as above  PROCEDURE: Ultrasound guidance for vascular access to the left internal jugular vein Fluoroscopic guidance for placement of catheter Placement of a 23 cm tip to cuff tunneled hemodialysis catheter via the left internal jugular vein  SURGEON: Leotis Pain, MD  ANESTHESIA:  Local with Moderate conscious sedation for approximately 22 minutes using 2 mg of Versed and 100 mcg of Fentanyl  ESTIMATED BLOOD LOSS: 5 cc  FLUORO TIME: less than one minute  CONTRAST: none  FINDING(S): 1.  Patent left internal jugular vein  SPECIMEN(S):  None  INDICATIONS:   Eric Frey is a 41 y.o. male who presents with renal failure and his previous permcath has come out.  The patient needs long term dialysis access for their ESRD, and a Permcath is necessary.  Risks and benefits are discussed and informed consent is obtained.    DESCRIPTION: After obtaining full informed written consent, the patient was brought back to the vascular suited. The patient's left neck and chest were sterilely prepped and draped in a sterile surgical field was created. Moderate conscious sedation was administered during a face to face encounter with the patient throughout the procedure with my supervision of the RN administering medicines and monitoring the patient's vital signs, pulse oximetry, telemetry and mental status throughout from the start of the procedure until the patient was taken to the recovery room.  The left internal jugular vein was visualized with ultrasound and found to be patent. It was then accessed under direct ultrasound guidance and a permanent image was recorded. A wire was placed. After skin nick and dilatation, the peel-away sheath was placed over the wire. I then turned my attention to an area under the clavicle. Approximately 1-2 fingerbreadths below the clavicle a small counterincision was created and  tunneled from the subclavicular incision to the access site. Using fluoroscopic guidance, a 23 centimeter tip to cuff tunneled hemodialysis catheter was selected, and tunneled from the subclavicular incision to the access site. It was then placed through the peel-away sheath and the peel-away sheath was removed. Using fluoroscopic guidance the catheter tips were parked in the right atrium. The appropriate distal connectors were placed. It withdrew blood well and flushed easily with heparinized saline and a concentrated heparin solution was then placed. It was secured to the chest wall with 2 Prolene sutures. The access incision was closed single 4-0 Monocryl. A 4-0 Monocryl pursestring suture was placed around the exit site. Sterile dressings were placed. The patient tolerated the procedure well and was taken to the recovery room in stable condition.  COMPLICATIONS: None  CONDITION: Stable  Leotis Pain  05/17/2022, 2:13 PM   This note was created with Dragon Medical transcription system. Any errors in dictation are purely unintentional.

## 2022-05-17 NOTE — Telephone Encounter (Signed)
Spoke with the patient and he is scheduled with Dr. Lucky Cowboy for a permcath insertion with a 1:00 pm arrival time to the MM. Pre-procedure instructions were discussed and patient stated he understood. Patient was asked about having eaten or drank anything and he stated he had not.

## 2022-05-21 ENCOUNTER — Encounter: Payer: Self-pay | Admitting: Vascular Surgery

## 2022-05-22 ENCOUNTER — Ambulatory Visit
Admit: 2022-05-22 | Discharge: 2022-05-23 | Payer: MEDICARE | Attending: Addiction (Substance Use Disorder) | Primary: Addiction (Substance Use Disorder)

## 2022-05-24 ENCOUNTER — Ambulatory Visit
Admit: 2022-05-24 | Discharge: 2022-05-24 | Payer: MEDICARE | Attending: Addiction (Substance Use Disorder) | Primary: Addiction (Substance Use Disorder)

## 2022-05-24 ENCOUNTER — Telehealth
Admit: 2022-05-24 | Discharge: 2022-05-24 | Payer: MEDICARE | Attending: Addiction (Substance Use Disorder) | Primary: Addiction (Substance Use Disorder)

## 2022-05-24 DIAGNOSIS — F1220 Cannabis dependence, uncomplicated: Principal | ICD-10-CM

## 2022-05-24 DIAGNOSIS — F1420 Cocaine dependence, uncomplicated: Principal | ICD-10-CM

## 2022-05-24 DIAGNOSIS — F1020 Alcohol dependence, uncomplicated: Principal | ICD-10-CM

## 2022-05-29 ENCOUNTER — Telehealth
Admit: 2022-05-29 | Discharge: 2022-05-30 | Payer: MEDICARE | Attending: Addiction (Substance Use Disorder) | Primary: Addiction (Substance Use Disorder)

## 2022-05-31 ENCOUNTER — Ambulatory Visit
Admit: 2022-05-31 | Payer: MEDICARE | Attending: Addiction (Substance Use Disorder) | Primary: Addiction (Substance Use Disorder)

## 2022-06-07 ENCOUNTER — Telehealth
Admit: 2022-06-07 | Discharge: 2022-06-08 | Payer: MEDICARE | Attending: Addiction (Substance Use Disorder) | Primary: Addiction (Substance Use Disorder)

## 2022-06-07 DIAGNOSIS — F1420 Cocaine dependence, uncomplicated: Principal | ICD-10-CM

## 2022-06-12 ENCOUNTER — Telehealth
Admit: 2022-06-12 | Discharge: 2022-06-12 | Payer: MEDICARE | Attending: Addiction (Substance Use Disorder) | Primary: Addiction (Substance Use Disorder)

## 2022-06-12 DIAGNOSIS — F1020 Alcohol dependence, uncomplicated: Principal | ICD-10-CM

## 2022-06-12 DIAGNOSIS — F1220 Cannabis dependence, uncomplicated: Principal | ICD-10-CM

## 2022-06-12 DIAGNOSIS — F1420 Cocaine dependence, uncomplicated: Principal | ICD-10-CM

## 2022-06-14 ENCOUNTER — Ambulatory Visit
Admit: 2022-06-14 | Payer: MEDICARE | Attending: Addiction (Substance Use Disorder) | Primary: Addiction (Substance Use Disorder)

## 2022-06-19 ENCOUNTER — Telehealth
Admit: 2022-06-19 | Discharge: 2022-06-20 | Payer: MEDICARE | Attending: Addiction (Substance Use Disorder) | Primary: Addiction (Substance Use Disorder)

## 2022-06-19 DIAGNOSIS — F1420 Cocaine dependence, uncomplicated: Principal | ICD-10-CM

## 2022-06-19 DIAGNOSIS — F1020 Alcohol dependence, uncomplicated: Principal | ICD-10-CM

## 2022-06-24 ENCOUNTER — Telehealth
Admit: 2022-06-24 | Discharge: 2022-06-25 | Payer: MEDICARE | Attending: Addiction (Substance Use Disorder) | Primary: Addiction (Substance Use Disorder)

## 2022-06-24 DIAGNOSIS — F1420 Cocaine dependence, uncomplicated: Principal | ICD-10-CM

## 2022-06-24 DIAGNOSIS — F1020 Alcohol dependence, uncomplicated: Principal | ICD-10-CM

## 2022-06-24 DIAGNOSIS — F1220 Cannabis dependence, uncomplicated: Principal | ICD-10-CM

## 2022-06-28 ENCOUNTER — Ambulatory Visit
Admit: 2022-06-28 | Discharge: 2022-06-29 | Payer: MEDICARE | Attending: Student in an Organized Health Care Education/Training Program | Primary: Student in an Organized Health Care Education/Training Program

## 2022-06-28 ENCOUNTER — Ambulatory Visit: Admit: 2022-06-28 | Discharge: 2022-06-29 | Payer: MEDICARE

## 2022-06-28 DIAGNOSIS — N186 End stage renal disease: Principal | ICD-10-CM

## 2022-07-05 ENCOUNTER — Ambulatory Visit
Admit: 2022-07-05 | Payer: MEDICARE | Attending: Addiction (Substance Use Disorder) | Primary: Addiction (Substance Use Disorder)

## 2022-07-08 ENCOUNTER — Telehealth
Admit: 2022-07-08 | Discharge: 2022-07-09 | Payer: MEDICARE | Attending: Addiction (Substance Use Disorder) | Primary: Addiction (Substance Use Disorder)

## 2022-07-08 DIAGNOSIS — F1420 Cocaine dependence, uncomplicated: Principal | ICD-10-CM

## 2022-07-09 DIAGNOSIS — N186 End stage renal disease: Principal | ICD-10-CM

## 2022-07-22 ENCOUNTER — Telehealth
Admit: 2022-07-22 | Discharge: 2022-07-23 | Payer: MEDICARE | Attending: Addiction (Substance Use Disorder) | Primary: Addiction (Substance Use Disorder)

## 2022-07-22 DIAGNOSIS — F1420 Cocaine dependence, uncomplicated: Principal | ICD-10-CM

## 2022-07-22 DIAGNOSIS — F1220 Cannabis dependence, uncomplicated: Principal | ICD-10-CM

## 2022-07-22 DIAGNOSIS — F1020 Alcohol dependence, uncomplicated: Principal | ICD-10-CM

## 2022-07-26 ENCOUNTER — Telehealth
Admit: 2022-07-26 | Discharge: 2022-07-27 | Payer: MEDICARE | Attending: Addiction (Substance Use Disorder) | Primary: Addiction (Substance Use Disorder)

## 2022-07-26 ENCOUNTER — Ambulatory Visit: Payer: Medicare Other

## 2022-07-26 DIAGNOSIS — F1020 Alcohol dependence, uncomplicated: Principal | ICD-10-CM

## 2022-07-26 DIAGNOSIS — F1420 Cocaine dependence, uncomplicated: Principal | ICD-10-CM

## 2022-08-02 ENCOUNTER — Telehealth
Admit: 2022-08-02 | Discharge: 2022-08-03 | Payer: MEDICARE | Attending: Addiction (Substance Use Disorder) | Primary: Addiction (Substance Use Disorder)

## 2022-08-02 DIAGNOSIS — F1020 Alcohol dependence, uncomplicated: Principal | ICD-10-CM

## 2022-08-02 DIAGNOSIS — F1220 Cannabis dependence, uncomplicated: Principal | ICD-10-CM

## 2022-08-02 DIAGNOSIS — F1420 Cocaine dependence, uncomplicated: Principal | ICD-10-CM

## 2022-08-07 ENCOUNTER — Telehealth
Admit: 2022-08-07 | Discharge: 2022-08-08 | Payer: MEDICARE | Attending: Addiction (Substance Use Disorder) | Primary: Addiction (Substance Use Disorder)

## 2022-08-09 ENCOUNTER — Telehealth
Admit: 2022-08-09 | Discharge: 2022-08-10 | Payer: MEDICARE | Attending: Addiction (Substance Use Disorder) | Primary: Addiction (Substance Use Disorder)

## 2022-08-12 ENCOUNTER — Telehealth
Admit: 2022-08-12 | Discharge: 2022-08-13 | Payer: MEDICARE | Attending: Addiction (Substance Use Disorder) | Primary: Addiction (Substance Use Disorder)

## 2022-08-12 DIAGNOSIS — F1020 Alcohol dependence, uncomplicated: Principal | ICD-10-CM

## 2022-08-12 DIAGNOSIS — F1420 Cocaine dependence, uncomplicated: Principal | ICD-10-CM

## 2022-08-14 ENCOUNTER — Ambulatory Visit
Admit: 2022-08-14 | Discharge: 2022-08-15 | Payer: MEDICARE | Attending: Addiction (Substance Use Disorder) | Primary: Addiction (Substance Use Disorder)

## 2022-08-28 ENCOUNTER — Ambulatory Visit
Admit: 2022-08-28 | Discharge: 2022-08-29 | Payer: MEDICARE | Attending: Addiction (Substance Use Disorder) | Primary: Addiction (Substance Use Disorder)

## 2022-08-30 ENCOUNTER — Telehealth
Admit: 2022-08-30 | Discharge: 2022-08-31 | Payer: MEDICARE | Attending: Addiction (Substance Use Disorder) | Primary: Addiction (Substance Use Disorder)

## 2022-08-30 DIAGNOSIS — F1020 Alcohol dependence, uncomplicated: Principal | ICD-10-CM

## 2022-08-30 DIAGNOSIS — F1420 Cocaine dependence, uncomplicated: Principal | ICD-10-CM

## 2022-08-30 DIAGNOSIS — F1220 Cannabis dependence, uncomplicated: Principal | ICD-10-CM

## 2022-09-06 ENCOUNTER — Ambulatory Visit
Admit: 2022-09-06 | Discharge: 2022-09-07 | Payer: MEDICARE | Attending: Addiction (Substance Use Disorder) | Primary: Addiction (Substance Use Disorder)

## 2022-09-09 ENCOUNTER — Ambulatory Visit: Admit: 2022-09-09 | Discharge: 2022-09-09 | Payer: MEDICARE

## 2022-09-09 DIAGNOSIS — N186 End stage renal disease: Principal | ICD-10-CM

## 2022-09-16 ENCOUNTER — Telehealth
Admit: 2022-09-16 | Discharge: 2022-09-17 | Payer: MEDICARE | Attending: Addiction (Substance Use Disorder) | Primary: Addiction (Substance Use Disorder)

## 2022-09-16 DIAGNOSIS — F1020 Alcohol dependence, uncomplicated: Principal | ICD-10-CM

## 2022-09-16 DIAGNOSIS — F1420 Cocaine dependence, uncomplicated: Principal | ICD-10-CM

## 2022-09-16 DIAGNOSIS — F1220 Cannabis dependence, uncomplicated: Principal | ICD-10-CM

## 2022-09-18 ENCOUNTER — Telehealth
Admit: 2022-09-18 | Discharge: 2022-09-19 | Payer: MEDICARE | Attending: Addiction (Substance Use Disorder) | Primary: Addiction (Substance Use Disorder)

## 2022-09-20 ENCOUNTER — Ambulatory Visit
Admit: 2022-09-20 | Discharge: 2022-09-21 | Payer: MEDICARE | Attending: Addiction (Substance Use Disorder) | Primary: Addiction (Substance Use Disorder)

## 2022-09-24 MED ORDER — HYDROXYZINE HCL 25 MG TABLET
ORAL_TABLET | Freq: Three times a day (TID) | ORAL | 3 refills | 30 days | Status: CP | PRN
Start: 2022-09-24 — End: 2022-12-23

## 2022-10-04 ENCOUNTER — Telehealth: Admit: 2022-10-04 | Discharge: 2022-10-05 | Payer: MEDICARE

## 2022-10-07 ENCOUNTER — Encounter
Admit: 2022-10-07 | Discharge: 2022-10-07 | Payer: MEDICARE | Attending: Student in an Organized Health Care Education/Training Program | Primary: Student in an Organized Health Care Education/Training Program

## 2022-10-07 ENCOUNTER — Ambulatory Visit: Admit: 2022-10-07 | Discharge: 2022-10-07 | Payer: MEDICARE

## 2022-10-09 ENCOUNTER — Ambulatory Visit
Admit: 2022-10-09 | Discharge: 2022-10-10 | Payer: MEDICARE | Attending: Addiction (Substance Use Disorder) | Primary: Addiction (Substance Use Disorder)

## 2022-10-10 DIAGNOSIS — F1911 Other psychoactive substance abuse, in remission: Principal | ICD-10-CM

## 2022-10-10 DIAGNOSIS — N186 End stage renal disease: Principal | ICD-10-CM

## 2022-10-14 ENCOUNTER — Telehealth
Admit: 2022-10-14 | Discharge: 2022-10-15 | Payer: MEDICARE | Attending: Addiction (Substance Use Disorder) | Primary: Addiction (Substance Use Disorder)

## 2022-10-14 DIAGNOSIS — F1220 Cannabis dependence, uncomplicated: Principal | ICD-10-CM

## 2022-10-14 DIAGNOSIS — F1420 Cocaine dependence, uncomplicated: Principal | ICD-10-CM

## 2022-10-14 DIAGNOSIS — F1020 Alcohol dependence, uncomplicated: Principal | ICD-10-CM

## 2022-10-16 ENCOUNTER — Ambulatory Visit: Admit: 2022-10-16 | Payer: MEDICARE

## 2022-10-18 ENCOUNTER — Telehealth
Admit: 2022-10-18 | Discharge: 2022-10-19 | Payer: MEDICARE | Attending: Addiction (Substance Use Disorder) | Primary: Addiction (Substance Use Disorder)

## 2022-10-18 DIAGNOSIS — F1420 Cocaine dependence, uncomplicated: Principal | ICD-10-CM

## 2022-10-18 DIAGNOSIS — F1020 Alcohol dependence, uncomplicated: Principal | ICD-10-CM

## 2022-11-08 ENCOUNTER — Ambulatory Visit: Admit: 2022-11-08 | Discharge: 2022-11-08 | Payer: MEDICARE

## 2022-11-08 ENCOUNTER — Ambulatory Visit
Admit: 2022-11-08 | Discharge: 2022-11-08 | Payer: MEDICARE | Attending: Student in an Organized Health Care Education/Training Program | Primary: Student in an Organized Health Care Education/Training Program

## 2022-11-08 DIAGNOSIS — N186 End stage renal disease: Principal | ICD-10-CM

## 2022-11-11 ENCOUNTER — Telehealth
Admit: 2022-11-11 | Discharge: 2022-11-12 | Payer: MEDICARE | Attending: Addiction (Substance Use Disorder) | Primary: Addiction (Substance Use Disorder)

## 2022-11-11 DIAGNOSIS — F1020 Alcohol dependence, uncomplicated: Principal | ICD-10-CM

## 2022-11-11 DIAGNOSIS — F1420 Cocaine dependence, uncomplicated: Principal | ICD-10-CM

## 2022-11-13 ENCOUNTER — Ambulatory Visit: Admit: 2022-11-13 | Payer: MEDICARE

## 2022-11-18 ENCOUNTER — Ambulatory Visit: Admit: 2022-11-18 | Discharge: 2022-11-18 | Payer: MEDICARE

## 2022-11-18 DIAGNOSIS — N186 End stage renal disease: Principal | ICD-10-CM

## 2022-11-18 DIAGNOSIS — F1994 Other psychoactive substance use, unspecified with psychoactive substance-induced mood disorder: Principal | ICD-10-CM

## 2022-11-18 MED ORDER — TRAZODONE 100 MG TABLET
ORAL_TABLET | Freq: Every evening | ORAL | 0 refills | 30 days | Status: CN
Start: 2022-11-18 — End: 2022-12-18

## 2022-11-18 MED ORDER — SEVELAMER CARBONATE 800 MG TABLET
ORAL_TABLET | Freq: Four times a day (QID) | ORAL | 3 refills | 30.00000 days | Status: CP
Start: 2022-11-18 — End: ?

## 2022-11-27 ENCOUNTER — Telehealth: Admit: 2022-11-27 | Discharge: 2022-11-28 | Payer: MEDICARE

## 2022-11-27 DIAGNOSIS — F1420 Cocaine dependence, uncomplicated: Principal | ICD-10-CM

## 2022-11-27 DIAGNOSIS — F1020 Alcohol dependence, uncomplicated: Principal | ICD-10-CM

## 2022-12-04 ENCOUNTER — Telehealth: Admit: 2022-12-04 | Discharge: 2022-12-05 | Payer: MEDICARE

## 2022-12-04 DIAGNOSIS — F1420 Cocaine dependence, uncomplicated: Principal | ICD-10-CM

## 2022-12-09 ENCOUNTER — Telehealth
Admit: 2022-12-09 | Discharge: 2022-12-10 | Payer: MEDICARE | Attending: Addiction (Substance Use Disorder) | Primary: Addiction (Substance Use Disorder)

## 2022-12-09 DIAGNOSIS — F1420 Cocaine dependence, uncomplicated: Principal | ICD-10-CM

## 2022-12-09 DIAGNOSIS — F1020 Alcohol dependence, uncomplicated: Principal | ICD-10-CM

## 2022-12-25 ENCOUNTER — Ambulatory Visit
Admit: 2022-12-25 | Discharge: 2022-12-26 | Payer: MEDICARE | Attending: Student in an Organized Health Care Education/Training Program | Primary: Student in an Organized Health Care Education/Training Program

## 2023-01-03 ENCOUNTER — Telehealth
Admit: 2023-01-03 | Discharge: 2023-01-03 | Payer: MEDICARE | Attending: Addiction (Substance Use Disorder) | Primary: Addiction (Substance Use Disorder)

## 2023-01-03 ENCOUNTER — Telehealth: Admit: 2023-01-03 | Discharge: 2023-01-03 | Payer: MEDICARE | Attending: Clinical | Primary: Clinical

## 2023-01-03 DIAGNOSIS — F1420 Cocaine dependence, uncomplicated: Principal | ICD-10-CM

## 2023-01-17 ENCOUNTER — Telehealth: Admit: 2023-01-17 | Discharge: 2023-01-18 | Payer: MEDICARE | Attending: Clinical | Primary: Clinical

## 2023-01-17 DIAGNOSIS — F1020 Alcohol dependence, uncomplicated: Principal | ICD-10-CM

## 2023-01-20 ENCOUNTER — Telehealth
Admit: 2023-01-20 | Discharge: 2023-01-21 | Payer: MEDICARE | Attending: Addiction (Substance Use Disorder) | Primary: Addiction (Substance Use Disorder)

## 2023-01-20 DIAGNOSIS — E212 Other hyperparathyroidism: Principal | ICD-10-CM

## 2023-01-20 DIAGNOSIS — F1420 Cocaine dependence, uncomplicated: Principal | ICD-10-CM

## 2023-01-20 DIAGNOSIS — E213 Hyperparathyroidism, unspecified: Principal | ICD-10-CM

## 2023-01-21 DIAGNOSIS — N2581 Secondary hyperparathyroidism of renal origin: Principal | ICD-10-CM

## 2023-01-21 DIAGNOSIS — Z992 Dependence on renal dialysis: Principal | ICD-10-CM

## 2023-01-21 DIAGNOSIS — N186 End stage renal disease: Principal | ICD-10-CM

## 2023-01-22 ENCOUNTER — Ambulatory Visit: Admit: 2023-01-22 | Discharge: 2023-01-23 | Payer: MEDICARE

## 2023-01-29 ENCOUNTER — Ambulatory Visit: Admit: 2023-01-29 | Discharge: 2023-01-31 | Payer: MEDICARE

## 2023-01-29 ENCOUNTER — Ambulatory Visit: Admit: 2023-01-29 | Discharge: 2023-01-29 | Payer: MEDICARE

## 2023-02-14 ENCOUNTER — Ambulatory Visit: Admit: 2023-02-14 | Discharge: 2023-02-15 | Payer: MEDICARE | Attending: Clinical | Primary: Clinical

## 2023-02-14 ENCOUNTER — Telehealth
Admit: 2023-02-14 | Discharge: 2023-02-15 | Payer: MEDICARE | Attending: Addiction (Substance Use Disorder) | Primary: Addiction (Substance Use Disorder)

## 2023-02-15 ENCOUNTER — Ambulatory Visit: Admit: 2023-02-15 | Discharge: 2023-02-16 | Disposition: A | Discharge status: Home / Self Care | Payer: MEDICARE

## 2023-02-15 ENCOUNTER — Emergency Department: Admit: 2023-02-15 | Discharge: 2023-02-16 | Disposition: A | Discharge status: Home / Self Care | Payer: MEDICARE

## 2023-02-15 DIAGNOSIS — M79604 Pain in right leg: Principal | ICD-10-CM

## 2023-02-15 MED ORDER — TIZANIDINE 2 MG TABLET
ORAL_TABLET | Freq: Four times a day (QID) | ORAL | 0 refills | 4 days | Status: CP | PRN
Start: 2023-02-15 — End: ?

## 2023-02-18 ENCOUNTER — Ambulatory Visit
Admit: 2023-02-18 | Discharge: 2023-02-25 | Disposition: A | Discharge status: Home / Self Care | Payer: MEDICARE | Source: Ambulatory Visit

## 2023-02-18 ENCOUNTER — Ambulatory Visit: Admit: 2023-02-18 | Discharge: 2023-02-25 | Payer: MEDICARE

## 2023-02-18 ENCOUNTER — Ambulatory Visit: Admit: 2023-02-18 | Payer: MEDICARE

## 2023-02-18 ENCOUNTER — Ambulatory Visit: Admit: 2023-02-18 | Discharge: 2023-02-18 | Payer: MEDICARE

## 2023-02-18 ENCOUNTER — Telehealth: Payer: Self-pay

## 2023-02-18 DIAGNOSIS — E212 Other hyperparathyroidism: Principal | ICD-10-CM

## 2023-02-18 DIAGNOSIS — Z7289 Other problems related to lifestyle: Principal | ICD-10-CM

## 2023-02-18 DIAGNOSIS — N2581 Secondary hyperparathyroidism of renal origin: Principal | ICD-10-CM

## 2023-02-18 DIAGNOSIS — N186 End stage renal disease: Principal | ICD-10-CM

## 2023-02-18 DIAGNOSIS — Z114 Encounter for screening for human immunodeficiency virus [HIV]: Principal | ICD-10-CM

## 2023-02-18 NOTE — Transitions of Care (Post Inpatient/ED Visit) (Deleted)
   02/18/2023  Name: Eric Frey MRN: [id number] DOB: 1981-11-08  Today's TOC FU Call Status: Today's TOC FU Call Status:: Unsuccessul Call (1st Attempt)  Attempted to reach the patient regarding the most recent Inpatient/ED visit.  Follow Up Plan: Additional outreach attempts will be made to reach the patient to complete the Transitions of Care (Post Inpatient/ED visit) call.   Signature

## 2023-02-18 NOTE — Telephone Encounter (Signed)
Error

## 2023-02-21 ENCOUNTER — Ambulatory Visit: Admit: 2023-02-21 | Discharge: 2023-02-22 | Payer: MEDICARE

## 2023-02-25 MED ORDER — CINACALCET 90 MG TABLET
ORAL_TABLET | Freq: Every evening | ORAL | 2 refills | 30 days | Status: CP
Start: 2023-02-25 — End: 2023-05-26
  Filled 2023-02-25: qty 30, 15d supply, fill #0

## 2023-02-25 MED ORDER — GENTAMICIN 1 MG/ML, SODIUM CITRATE 4% (2.4 ML) INTRA-CATHETER SYRINGE
0 refills | 0 days | Status: CN
Start: 2023-02-25 — End: 2023-03-27

## 2023-02-25 MED ORDER — CALCITRIOL 0.5 MCG CAPSULE
ORAL_CAPSULE | ORAL | 11 refills | 28 days | Status: CP
Start: 2023-02-25 — End: 2024-02-25
  Filled 2023-02-25: qty 48, 28d supply, fill #0

## 2023-02-26 DIAGNOSIS — A419 Sepsis, unspecified organism: Principal | ICD-10-CM

## 2023-02-26 MED ORDER — EPOETIN ALFA 2,000 UNIT/ML INJECTION SOLUTION
SUBCUTANEOUS | 0 refills | 2 days | Status: CN
Start: 2023-02-26 — End: ?

## 2023-03-10 ENCOUNTER — Telehealth
Admit: 2023-03-10 | Discharge: 2023-03-11 | Payer: MEDICARE | Attending: Addiction (Substance Use Disorder) | Primary: Addiction (Substance Use Disorder)

## 2023-03-10 DIAGNOSIS — F1020 Alcohol dependence, uncomplicated: Principal | ICD-10-CM

## 2023-03-10 DIAGNOSIS — F1220 Cannabis dependence, uncomplicated: Principal | ICD-10-CM

## 2023-03-10 DIAGNOSIS — F1420 Cocaine dependence, uncomplicated: Principal | ICD-10-CM

## 2023-03-14 ENCOUNTER — Telehealth: Admit: 2023-03-14 | Discharge: 2023-03-15 | Payer: MEDICARE | Attending: Clinical | Primary: Clinical

## 2023-03-14 DIAGNOSIS — F1011 Alcohol abuse, in remission: Principal | ICD-10-CM

## 2023-04-04 ENCOUNTER — Telehealth: Admit: 2023-04-04 | Discharge: 2023-04-05 | Payer: MEDICARE | Attending: Clinical | Primary: Clinical

## 2023-04-04 DIAGNOSIS — F1020 Alcohol dependence, uncomplicated: Principal | ICD-10-CM

## 2023-04-07 ENCOUNTER — Telehealth
Admit: 2023-04-07 | Discharge: 2023-04-08 | Payer: MEDICARE | Attending: Addiction (Substance Use Disorder) | Primary: Addiction (Substance Use Disorder)

## 2023-04-07 DIAGNOSIS — F1220 Cannabis dependence, uncomplicated: Principal | ICD-10-CM

## 2023-04-07 DIAGNOSIS — F1020 Alcohol dependence, uncomplicated: Principal | ICD-10-CM

## 2023-04-07 DIAGNOSIS — F1420 Cocaine dependence, uncomplicated: Principal | ICD-10-CM

## 2023-04-09 ENCOUNTER — Ambulatory Visit: Admit: 2023-04-09 | Discharge: 2023-04-10 | Payer: MEDICARE

## 2023-05-06 DIAGNOSIS — G8929 Other chronic pain: Principal | ICD-10-CM

## 2023-05-06 DIAGNOSIS — M79671 Pain in right foot: Principal | ICD-10-CM

## 2023-05-12 ENCOUNTER — Telehealth
Admit: 2023-05-12 | Discharge: 2023-05-13 | Payer: MEDICARE | Attending: Addiction (Substance Use Disorder) | Primary: Addiction (Substance Use Disorder)

## 2023-05-12 DIAGNOSIS — F1020 Alcohol dependence, uncomplicated: Principal | ICD-10-CM

## 2023-05-12 DIAGNOSIS — F1220 Cannabis dependence, uncomplicated: Principal | ICD-10-CM

## 2023-05-12 DIAGNOSIS — F1420 Cocaine dependence, uncomplicated: Principal | ICD-10-CM

## 2023-05-23 ENCOUNTER — Ambulatory Visit: Admit: 2023-05-23 | Discharge: 2023-05-24 | Payer: MEDICARE

## 2023-05-23 DIAGNOSIS — Z992 Dependence on renal dialysis: Principal | ICD-10-CM

## 2023-05-23 DIAGNOSIS — Z940 Kidney transplant status: Principal | ICD-10-CM

## 2023-05-23 DIAGNOSIS — E1142 Type 2 diabetes mellitus with diabetic polyneuropathy: Principal | ICD-10-CM

## 2023-05-23 DIAGNOSIS — N186 End stage renal disease: Principal | ICD-10-CM

## 2023-05-23 DIAGNOSIS — Z01818 Encounter for other preprocedural examination: Principal | ICD-10-CM

## 2023-05-23 DIAGNOSIS — I10 Essential (primary) hypertension: Principal | ICD-10-CM

## 2023-05-23 DIAGNOSIS — E1169 Type 2 diabetes mellitus with other specified complication: Principal | ICD-10-CM

## 2023-05-23 DIAGNOSIS — E785 Hyperlipidemia, unspecified: Principal | ICD-10-CM

## 2023-05-28 ENCOUNTER — Ambulatory Visit: Admit: 2023-05-28 | Discharge: 2023-05-30 | Disposition: A | Discharge status: Home / Self Care | Payer: MEDICARE

## 2023-05-28 ENCOUNTER — Encounter: Admit: 2023-05-28 | Discharge: 2023-05-30 | Disposition: A | Discharge status: Home / Self Care | Payer: MEDICARE

## 2023-05-30 MED ORDER — POLYETHYLENE GLYCOL 3350 17 GRAM ORAL POWDER PACKET
PACK | Freq: Every day | ORAL | 0 refills | 7.00000 days | Status: CP
Start: 2023-05-30 — End: 2023-06-06

## 2023-05-30 MED ORDER — OXYCODONE 5 MG TABLET
ORAL_TABLET | ORAL | 0 refills | 1.00000 days | Status: CP | PRN
Start: 2023-05-30 — End: 2023-06-04

## 2023-05-30 MED ORDER — CALCIUM 600 MG (AS CALCIUM CARBONATE 1,500 MG) TABLET
ORAL_TABLET | Freq: Three times a day (TID) | ORAL | 3 refills | 30.00000 days | Status: CP
Start: 2023-05-30 — End: 2023-09-27

## 2023-05-30 MED ORDER — CALCITRIOL 0.5 MCG CAPSULE
ORAL_CAPSULE | Freq: Two times a day (BID) | ORAL | 3 refills | 15.00000 days | Status: CP
Start: 2023-05-30 — End: 2023-07-29

## 2023-06-09 ENCOUNTER — Ambulatory Visit: Admit: 2023-06-09 | Discharge: 2023-06-10 | Payer: MEDICARE

## 2023-06-09 ENCOUNTER — Telehealth
Admit: 2023-06-09 | Discharge: 2023-06-10 | Payer: MEDICARE | Attending: Addiction (Substance Use Disorder) | Primary: Addiction (Substance Use Disorder)

## 2023-07-07 ENCOUNTER — Telehealth
Admit: 2023-07-07 | Discharge: 2023-07-08 | Payer: MEDICARE | Attending: Addiction (Substance Use Disorder) | Primary: Addiction (Substance Use Disorder)

## 2023-07-07 DIAGNOSIS — F1020 Alcohol dependence, uncomplicated: Principal | ICD-10-CM

## 2023-07-07 DIAGNOSIS — F1420 Cocaine dependence, uncomplicated: Principal | ICD-10-CM

## 2023-07-09 ENCOUNTER — Ambulatory Visit: Admit: 2023-07-09 | Discharge: 2023-07-10 | Payer: MEDICARE

## 2023-07-11 ENCOUNTER — Ambulatory Visit: Admit: 2023-07-11 | Discharge: 2023-07-12 | Payer: MEDICARE | Attending: Clinical | Primary: Clinical

## 2023-07-11 DIAGNOSIS — E875 Hyperkalemia: Principal | ICD-10-CM

## 2023-07-11 MED ORDER — PATIROMER CALCIUM SORBITEX 16.8 GRAM ORAL POWDER PACKET
PACK | 11 refills | 0 days | Status: CP
Start: 2023-07-11 — End: ?

## 2023-07-12 ENCOUNTER — Ambulatory Visit: Admit: 2023-07-12 | Discharge: 2023-07-13 | Payer: MEDICARE

## 2023-07-18 ENCOUNTER — Telehealth: Admit: 2023-07-18 | Discharge: 2023-07-19 | Payer: MEDICARE | Attending: Clinical | Primary: Clinical

## 2023-07-18 DIAGNOSIS — F1020 Alcohol dependence, uncomplicated: Principal | ICD-10-CM

## 2023-07-23 ENCOUNTER — Telehealth
Admit: 2023-07-23 | Discharge: 2023-07-24 | Payer: MEDICARE | Attending: Addiction (Substance Use Disorder) | Primary: Addiction (Substance Use Disorder)

## 2023-07-23 ENCOUNTER — Ambulatory Visit: Admit: 2023-07-23 | Discharge: 2023-07-24 | Payer: MEDICARE

## 2023-07-23 DIAGNOSIS — F1020 Alcohol dependence, uncomplicated: Principal | ICD-10-CM

## 2023-07-23 DIAGNOSIS — F1420 Cocaine dependence, uncomplicated: Principal | ICD-10-CM

## 2023-07-25 ENCOUNTER — Telehealth: Admit: 2023-07-25 | Discharge: 2023-07-26 | Payer: MEDICARE | Attending: Clinical | Primary: Clinical

## 2023-07-25 DIAGNOSIS — F1020 Alcohol dependence, uncomplicated: Principal | ICD-10-CM

## 2023-07-30 ENCOUNTER — Telehealth: Admit: 2023-07-30 | Discharge: 2023-07-31 | Payer: MEDICARE | Attending: Clinical | Primary: Clinical

## 2023-07-30 DIAGNOSIS — F1020 Alcohol dependence, uncomplicated: Principal | ICD-10-CM

## 2023-08-01 ENCOUNTER — Telehealth: Admit: 2023-08-01 | Discharge: 2023-08-02 | Payer: MEDICARE | Attending: Clinical | Primary: Clinical

## 2023-08-01 DIAGNOSIS — F1020 Alcohol dependence, uncomplicated: Principal | ICD-10-CM

## 2023-08-06 ENCOUNTER — Telehealth: Admit: 2023-08-06 | Discharge: 2023-08-07 | Payer: MEDICARE

## 2023-08-11 ENCOUNTER — Telehealth
Admit: 2023-08-11 | Discharge: 2023-08-12 | Payer: MEDICARE | Attending: Addiction (Substance Use Disorder) | Primary: Addiction (Substance Use Disorder)

## 2023-08-11 DIAGNOSIS — F1420 Cocaine dependence, uncomplicated: Principal | ICD-10-CM

## 2023-08-11 DIAGNOSIS — F1220 Cannabis dependence, uncomplicated: Principal | ICD-10-CM

## 2023-08-11 DIAGNOSIS — F1020 Alcohol dependence, uncomplicated: Principal | ICD-10-CM

## 2023-08-20 ENCOUNTER — Ambulatory Visit: Admit: 2023-08-20 | Discharge: 2023-08-21 | Payer: MEDICARE

## 2023-08-29 ENCOUNTER — Telehealth: Admit: 2023-08-29 | Discharge: 2023-08-30 | Payer: MEDICARE | Attending: Clinical | Primary: Clinical

## 2023-08-29 DIAGNOSIS — F1420 Cocaine dependence, uncomplicated: Principal | ICD-10-CM

## 2023-08-29 DIAGNOSIS — F1020 Alcohol dependence, uncomplicated: Principal | ICD-10-CM

## 2023-09-03 ENCOUNTER — Ambulatory Visit: Admit: 2023-09-03 | Discharge: 2023-09-03 | Payer: MEDICARE

## 2023-09-03 ENCOUNTER — Telehealth
Admit: 2023-09-03 | Discharge: 2023-09-03 | Payer: MEDICARE | Attending: Addiction (Substance Use Disorder) | Primary: Addiction (Substance Use Disorder)

## 2023-09-03 DIAGNOSIS — F1420 Cocaine dependence, uncomplicated: Principal | ICD-10-CM

## 2023-09-03 DIAGNOSIS — F1020 Alcohol dependence, uncomplicated: Principal | ICD-10-CM

## 2023-09-24 ENCOUNTER — Telehealth: Admit: 2023-09-24 | Discharge: 2023-09-25 | Payer: MEDICARE

## 2023-10-01 ENCOUNTER — Telehealth
Admit: 2023-10-01 | Discharge: 2023-10-02 | Payer: MEDICARE | Attending: Addiction (Substance Use Disorder) | Primary: Addiction (Substance Use Disorder)

## 2023-10-01 DIAGNOSIS — F1220 Cannabis dependence, uncomplicated: Principal | ICD-10-CM

## 2023-10-01 DIAGNOSIS — F1020 Alcohol dependence, uncomplicated: Principal | ICD-10-CM

## 2023-10-01 DIAGNOSIS — F1420 Cocaine dependence, uncomplicated: Principal | ICD-10-CM

## 2023-10-08 ENCOUNTER — Ambulatory Visit: Admit: 2023-10-08 | Payer: MEDICARE

## 2023-10-29 ENCOUNTER — Ambulatory Visit: Admit: 2023-10-29 | Discharge: 2023-10-29 | Payer: MEDICARE | Attending: Clinical | Primary: Clinical

## 2023-10-29 ENCOUNTER — Telehealth
Admit: 2023-10-29 | Discharge: 2023-10-29 | Payer: MEDICARE | Attending: Addiction (Substance Use Disorder) | Primary: Addiction (Substance Use Disorder)

## 2023-10-29 DIAGNOSIS — F1020 Alcohol dependence, uncomplicated: Principal | ICD-10-CM

## 2023-10-29 DIAGNOSIS — F1220 Cannabis dependence, uncomplicated: Principal | ICD-10-CM

## 2023-10-29 DIAGNOSIS — F1420 Cocaine dependence, uncomplicated: Principal | ICD-10-CM

## 2023-11-12 ENCOUNTER — Telehealth: Admit: 2023-11-12 | Discharge: 2023-11-13 | Payer: MEDICARE | Attending: Clinical | Primary: Clinical

## 2023-11-12 DIAGNOSIS — F1220 Cannabis dependence, uncomplicated: Principal | ICD-10-CM

## 2023-11-19 ENCOUNTER — Telehealth
Admit: 2023-11-19 | Discharge: 2023-11-20 | Payer: MEDICARE | Attending: Addiction (Substance Use Disorder) | Primary: Addiction (Substance Use Disorder)

## 2023-11-19 DIAGNOSIS — F1020 Alcohol dependence, uncomplicated: Principal | ICD-10-CM

## 2023-11-19 DIAGNOSIS — F1220 Cannabis dependence, uncomplicated: Principal | ICD-10-CM

## 2023-11-19 DIAGNOSIS — F1420 Cocaine dependence, uncomplicated: Principal | ICD-10-CM

## 2023-12-03 ENCOUNTER — Telehealth: Admit: 2023-12-03 | Discharge: 2023-12-04 | Payer: MEDICARE | Attending: Clinical | Primary: Clinical

## 2023-12-10 ENCOUNTER — Telehealth: Admit: 2023-12-10 | Discharge: 2023-12-11 | Payer: MEDICARE | Attending: Clinical | Primary: Clinical

## 2023-12-10 DIAGNOSIS — F1020 Alcohol dependence, uncomplicated: Principal | ICD-10-CM

## 2023-12-10 DIAGNOSIS — F1220 Cannabis dependence, uncomplicated: Principal | ICD-10-CM

## 2023-12-10 DIAGNOSIS — F1420 Cocaine dependence, uncomplicated: Principal | ICD-10-CM

## 2023-12-19 ENCOUNTER — Telehealth
Admit: 2023-12-19 | Discharge: 2023-12-20 | Payer: MEDICARE | Attending: Addiction (Substance Use Disorder) | Primary: Addiction (Substance Use Disorder)

## 2023-12-19 DIAGNOSIS — F1220 Cannabis dependence, uncomplicated: Principal | ICD-10-CM

## 2023-12-19 DIAGNOSIS — F1020 Alcohol dependence, uncomplicated: Principal | ICD-10-CM

## 2023-12-19 DIAGNOSIS — F1420 Cocaine dependence, uncomplicated: Principal | ICD-10-CM

## 2024-03-20 ENCOUNTER — Emergency Department: Admit: 2024-03-20 | Discharge: 2024-03-20 | Disposition: A | Discharge status: Home / Self Care | Payer: MEDICARE

## 2024-03-20 DIAGNOSIS — R0789 Other chest pain: Principal | ICD-10-CM

## 2024-03-24 ENCOUNTER — Other Ambulatory Visit: Payer: Self-pay | Admitting: Student

## 2024-03-24 DIAGNOSIS — I2089 Other forms of angina pectoris: Secondary | ICD-10-CM

## 2024-03-24 DIAGNOSIS — I10 Essential (primary) hypertension: Secondary | ICD-10-CM

## 2024-04-05 ENCOUNTER — Ambulatory Visit

## 2024-04-09 ENCOUNTER — Telehealth (HOSPITAL_COMMUNITY): Payer: Self-pay | Admitting: *Deleted

## 2024-04-09 MED ORDER — METOPROLOL TARTRATE 100 MG PO TABS
ORAL_TABLET | ORAL | 0 refills | Status: AC
Start: 2024-04-09 — End: ?

## 2024-04-09 NOTE — Telephone Encounter (Signed)
 Reaching out to patient to offer assistance regarding upcoming cardiac imaging study; pt verbalizes understanding of appt date/time, parking situation and where to check in, pre-test NPO status and medications ordered, and verified current allergies; name and call back number provided for further questions should they arise  Larey Brick RN Navigator Cardiac Imaging [facility] [phone] office [phone] cell  Patient to take 100mg  metoprolol tartrate two hours prior to his cardiac CT scan.

## 2024-04-09 NOTE — Telephone Encounter (Signed)
 Attempted to call patient regarding upcoming cardiac CT appointment. Left message on voicemail with name and callback number Johney Frame RN Navigator Cardiac Imaging [facility] [phone] Office

## 2024-04-12 ENCOUNTER — Ambulatory Visit
Admission: RE | Admit: 2024-04-12 | Discharge: 2024-04-12 | Disposition: A | Discharge status: Home / Self Care | Source: Ambulatory Visit | Attending: Student | Admitting: Student

## 2024-04-12 DIAGNOSIS — I2089 Other forms of angina pectoris: Secondary | ICD-10-CM | POA: Diagnosis present

## 2024-04-12 DIAGNOSIS — I10 Essential (primary) hypertension: Secondary | ICD-10-CM | POA: Insufficient documentation

## 2024-04-12 MED ORDER — IOHEXOL 350 MG/ML SOLN
80.0000 mL | Freq: Once | INTRAVENOUS | Status: AC | PRN
  Administered 2024-04-12: 80 mL via INTRAVENOUS

## 2024-04-12 MED ORDER — DILTIAZEM HCL 25 MG/5ML IV SOLN
10.0000 mg | INTRAVENOUS | Status: DC | PRN
  Filled 2024-04-12: qty 5

## 2024-04-12 MED ORDER — METOPROLOL TARTRATE 5 MG/5ML IV SOLN
10.0000 mg | Freq: Once | INTRAVENOUS | Status: DC | PRN
  Filled 2024-04-12: qty 10

## 2024-04-12 MED ORDER — NITROGLYCERIN 0.4 MG SL SUBL
SUBLINGUAL_TABLET | SUBLINGUAL | Status: AC
  Filled 2024-04-12: qty 1

## 2024-04-12 MED ORDER — NITROGLYCERIN 0.4 MG SL SUBL
0.8000 mg | SUBLINGUAL_TABLET | Freq: Once | SUBLINGUAL | Status: DC
  Filled 2024-04-12: qty 25

## 2024-04-12 MED ORDER — NITROGLYCERIN 0.4 MG SL SUBL
0.4000 mg | SUBLINGUAL_TABLET | Freq: Once | SUBLINGUAL | Status: AC
  Administered 2024-04-12: 0.4 mg via SUBLINGUAL
  Filled 2024-04-12: qty 25

## 2024-04-26 ENCOUNTER — Emergency Department: Admit: 2024-04-26 | Discharge: 2024-04-26 | Discharge status: Against Medical Advice | Payer: MEDICARE

## 2024-05-25 DIAGNOSIS — N186 End stage renal disease: Principal | ICD-10-CM

## 2024-05-25 DIAGNOSIS — E1159 Type 2 diabetes mellitus with other circulatory complications: Principal | ICD-10-CM

## 2024-05-25 DIAGNOSIS — I10 Essential (primary) hypertension: Principal | ICD-10-CM

## 2024-05-25 DIAGNOSIS — Z992 Dependence on renal dialysis: Principal | ICD-10-CM

## 2024-05-25 DIAGNOSIS — I152 Hypertension secondary to endocrine disorders: Principal | ICD-10-CM

## 2024-05-25 MED ORDER — AMLODIPINE 10 MG TABLET
ORAL_TABLET | Freq: Every day | ORAL | 3 refills | 100.00000 days | Status: CP
Start: 2024-05-25 — End: 2025-06-29

## 2024-06-08 ENCOUNTER — Encounter
Admit: 2024-06-08 | Discharge: 2024-06-09 | Payer: MEDICARE | Attending: Addiction (Substance Use Disorder) | Primary: Addiction (Substance Use Disorder)

## 2024-06-08 DIAGNOSIS — F1420 Cocaine dependence, uncomplicated: Principal | ICD-10-CM

## 2024-06-24 DIAGNOSIS — F1420 Cocaine dependence, uncomplicated: Principal | ICD-10-CM

## 2024-06-24 DIAGNOSIS — F1020 Alcohol dependence, uncomplicated: Principal | ICD-10-CM

## 2024-06-30 DIAGNOSIS — F1421 Cocaine dependence, in remission: Principal | ICD-10-CM

## 2024-07-12 ENCOUNTER — Ambulatory Visit: Admit: 2024-07-12 | Discharge: 2024-07-13 | Payer: MEDICARE

## 2024-07-12 ENCOUNTER — Encounter: Admit: 2024-07-12 | Discharge: 2024-07-13 | Payer: MEDICARE

## 2024-08-18 ENCOUNTER — Encounter: Admit: 2024-08-18 | Discharge: 2024-08-19 | Payer: MEDICARE

## 2024-08-18 ENCOUNTER — Ambulatory Visit: Admit: 2024-08-18 | Discharge: 2024-08-19 | Payer: MEDICARE

## 2024-09-17 ENCOUNTER — Ambulatory Visit: Admit: 2024-09-17 | Discharge: 2024-09-18 | Payer: MEDICARE

## 2024-10-20 ENCOUNTER — Ambulatory Visit: Admit: 2024-10-20 | Discharge: 2024-10-21 | Payer: MEDICARE

## 2024-11-11 ENCOUNTER — Ambulatory Visit: Admit: 2024-11-11 | Discharge: 2024-11-12 | Payer: MEDICARE

## 2024-12-21 ENCOUNTER — Ambulatory Visit: Admit: 2024-12-21 | Discharge: 2024-12-22 | Payer: MEDICARE

## 2024-12-21 DIAGNOSIS — T82858D Stenosis of vascular prosthetic devices, implants and grafts, subsequent encounter: Principal | ICD-10-CM

## 2024-12-27 ENCOUNTER — Inpatient Hospital Stay: Admit: 2024-12-27 | Discharge: 2024-12-28 | Payer: MEDICARE

## 2025-01-19 ENCOUNTER — Ambulatory Visit: Admit: 2025-01-19 | Discharge: 2025-01-20 | Payer: MEDICARE

## 2025-01-25 MED ORDER — NIFEDIPINE ER 60 MG TABLET,EXTENDED RELEASE 24 HR
ORAL_TABLET | Freq: Every day | ORAL | 3 refills | 100.00000 days | Status: CP
Start: 2025-01-25 — End: 2026-03-01
